# Patient Record
Sex: Female | Born: 1935 | Race: White | Hispanic: No | Marital: Single | Smoking: Never smoker
Health system: Southern US, Community
[De-identification: ages and names within clinical notes are randomized; demographics above are authoritative.]

## PROBLEM LIST (undated history)

## (undated) DIAGNOSIS — C50919 Malignant neoplasm of unspecified site of unspecified female breast: Secondary | ICD-10-CM

## (undated) DIAGNOSIS — I340 Nonrheumatic mitral (valve) insufficiency: Secondary | ICD-10-CM

## (undated) DIAGNOSIS — I4719 Other supraventricular tachycardia: Secondary | ICD-10-CM

## (undated) DIAGNOSIS — K5730 Diverticulosis of large intestine without perforation or abscess without bleeding: Secondary | ICD-10-CM

## (undated) DIAGNOSIS — I471 Supraventricular tachycardia: Secondary | ICD-10-CM

## (undated) DIAGNOSIS — K219 Gastro-esophageal reflux disease without esophagitis: Secondary | ICD-10-CM

## (undated) DIAGNOSIS — I050 Rheumatic mitral stenosis: Secondary | ICD-10-CM

## (undated) DIAGNOSIS — E039 Hypothyroidism, unspecified: Secondary | ICD-10-CM

## (undated) DIAGNOSIS — K52839 Microscopic colitis, unspecified: Secondary | ICD-10-CM

## (undated) DIAGNOSIS — K589 Irritable bowel syndrome without diarrhea: Secondary | ICD-10-CM

## (undated) DIAGNOSIS — Z9889 Other specified postprocedural states: Secondary | ICD-10-CM

## (undated) DIAGNOSIS — D369 Benign neoplasm, unspecified site: Secondary | ICD-10-CM

## (undated) DIAGNOSIS — I4891 Unspecified atrial fibrillation: Secondary | ICD-10-CM

## (undated) DIAGNOSIS — C434 Malignant melanoma of scalp and neck: Secondary | ICD-10-CM

## (undated) DIAGNOSIS — K449 Diaphragmatic hernia without obstruction or gangrene: Secondary | ICD-10-CM

## (undated) DIAGNOSIS — I099 Rheumatic heart disease, unspecified: Secondary | ICD-10-CM

## (undated) DIAGNOSIS — G4730 Sleep apnea, unspecified: Secondary | ICD-10-CM

## (undated) HISTORY — DX: Other supraventricular tachycardia: I47.19

## (undated) HISTORY — DX: Hypothyroidism, unspecified: E03.9

## (undated) HISTORY — PX: MASTECTOMY: SHX3

## (undated) HISTORY — DX: Unspecified atrial fibrillation: I48.91

## (undated) HISTORY — PX: ABDOMINAL HYSTERECTOMY: SHX81

## (undated) HISTORY — DX: Irritable bowel syndrome, unspecified: K58.9

## (undated) HISTORY — DX: Other specified postprocedural states: Z98.890

## (undated) HISTORY — DX: Diaphragmatic hernia without obstruction or gangrene: K44.9

## (undated) HISTORY — DX: Malignant neoplasm of unspecified site of unspecified female breast: C50.919

## (undated) HISTORY — PX: HEMORRHOID SURGERY: SHX153

## (undated) HISTORY — DX: Microscopic colitis, unspecified: K52.839

## (undated) HISTORY — DX: Benign neoplasm, unspecified site: D36.9

## (undated) HISTORY — DX: Rheumatic heart disease, unspecified: I09.9

## (undated) HISTORY — DX: Diverticulosis of large intestine without perforation or abscess without bleeding: K57.30

## (undated) HISTORY — DX: Supraventricular tachycardia: I47.1

## (undated) HISTORY — DX: Nonrheumatic mitral (valve) insufficiency: I34.0

## (undated) HISTORY — PX: TONSILLECTOMY: SUR1361

## (undated) HISTORY — DX: Gastro-esophageal reflux disease without esophagitis: K21.9

## (undated) HISTORY — PX: OTHER SURGICAL HISTORY: SHX169

## (undated) HISTORY — PX: CHOLECYSTECTOMY: SHX55

## (undated) HISTORY — DX: Rheumatic mitral stenosis: I05.0

## (undated) HISTORY — DX: Malignant melanoma of scalp and neck: C43.4

---

## 1998-02-08 ENCOUNTER — Encounter: Admission: RE | Admit: 1998-02-08 | Discharge: 1998-05-09 | Payer: Self-pay | Admitting: *Deleted

## 2000-07-13 ENCOUNTER — Encounter: Admission: RE | Admit: 2000-07-13 | Discharge: 2000-07-13 | Payer: Self-pay | Admitting: Oncology

## 2000-07-13 ENCOUNTER — Encounter (HOSPITAL_COMMUNITY): Admission: RE | Admit: 2000-07-13 | Discharge: 2000-08-12 | Payer: Self-pay | Admitting: Oncology

## 2000-12-24 ENCOUNTER — Encounter: Admission: RE | Admit: 2000-12-24 | Discharge: 2000-12-24 | Payer: Self-pay | Admitting: Oncology

## 2000-12-24 ENCOUNTER — Encounter (HOSPITAL_COMMUNITY): Admission: RE | Admit: 2000-12-24 | Discharge: 2001-01-23 | Payer: Self-pay | Admitting: Oncology

## 2000-12-24 ENCOUNTER — Encounter (HOSPITAL_COMMUNITY): Payer: Self-pay | Admitting: Oncology

## 2001-02-20 DIAGNOSIS — K52839 Microscopic colitis, unspecified: Secondary | ICD-10-CM

## 2001-02-20 HISTORY — DX: Microscopic colitis, unspecified: K52.839

## 2001-05-27 ENCOUNTER — Ambulatory Visit (HOSPITAL_COMMUNITY): Admission: RE | Admit: 2001-05-27 | Discharge: 2001-05-27 | Payer: Self-pay | Admitting: Internal Medicine

## 2001-06-26 ENCOUNTER — Encounter (HOSPITAL_COMMUNITY): Admission: RE | Admit: 2001-06-26 | Discharge: 2001-07-26 | Payer: Self-pay | Admitting: Family Medicine

## 2001-07-09 ENCOUNTER — Encounter (INDEPENDENT_AMBULATORY_CARE_PROVIDER_SITE_OTHER): Payer: Self-pay | Admitting: Internal Medicine

## 2001-07-09 ENCOUNTER — Ambulatory Visit (HOSPITAL_COMMUNITY): Admission: RE | Admit: 2001-07-09 | Discharge: 2001-07-09 | Payer: Self-pay | Admitting: Internal Medicine

## 2001-10-25 ENCOUNTER — Ambulatory Visit (HOSPITAL_COMMUNITY): Admission: RE | Admit: 2001-10-25 | Discharge: 2001-10-25 | Payer: Self-pay | Admitting: Internal Medicine

## 2001-10-25 ENCOUNTER — Encounter (INDEPENDENT_AMBULATORY_CARE_PROVIDER_SITE_OTHER): Payer: Self-pay | Admitting: Internal Medicine

## 2001-12-24 ENCOUNTER — Encounter (HOSPITAL_COMMUNITY): Payer: Self-pay | Admitting: Oncology

## 2001-12-27 ENCOUNTER — Encounter (HOSPITAL_COMMUNITY): Admission: RE | Admit: 2001-12-27 | Discharge: 2002-01-26 | Payer: Self-pay | Admitting: Oncology

## 2001-12-27 ENCOUNTER — Encounter: Admission: RE | Admit: 2001-12-27 | Discharge: 2001-12-27 | Payer: Self-pay | Admitting: Oncology

## 2002-06-27 ENCOUNTER — Encounter (HOSPITAL_COMMUNITY): Admission: RE | Admit: 2002-06-27 | Discharge: 2002-07-27 | Payer: Self-pay | Admitting: Oncology

## 2002-06-27 ENCOUNTER — Encounter: Admission: RE | Admit: 2002-06-27 | Discharge: 2002-06-27 | Payer: Self-pay | Admitting: Oncology

## 2002-07-04 ENCOUNTER — Encounter (HOSPITAL_COMMUNITY): Payer: Self-pay | Admitting: Oncology

## 2002-09-30 ENCOUNTER — Encounter: Payer: Self-pay | Admitting: Family Medicine

## 2002-09-30 ENCOUNTER — Ambulatory Visit (HOSPITAL_COMMUNITY): Admission: RE | Admit: 2002-09-30 | Discharge: 2002-09-30 | Payer: Self-pay | Admitting: Family Medicine

## 2002-12-26 ENCOUNTER — Encounter (HOSPITAL_COMMUNITY): Admission: RE | Admit: 2002-12-26 | Discharge: 2003-01-25 | Payer: Self-pay | Admitting: Oncology

## 2002-12-26 ENCOUNTER — Encounter: Admission: RE | Admit: 2002-12-26 | Discharge: 2002-12-26 | Payer: Self-pay | Admitting: Oncology

## 2003-06-26 ENCOUNTER — Encounter (HOSPITAL_COMMUNITY): Admission: RE | Admit: 2003-06-26 | Discharge: 2003-07-26 | Payer: Self-pay | Admitting: Oncology

## 2003-06-26 ENCOUNTER — Encounter: Admission: RE | Admit: 2003-06-26 | Discharge: 2003-06-26 | Payer: Self-pay | Admitting: Oncology

## 2003-08-03 ENCOUNTER — Inpatient Hospital Stay (HOSPITAL_COMMUNITY): Admission: RE | Admit: 2003-08-03 | Discharge: 2003-08-05 | Payer: Self-pay | Admitting: General Surgery

## 2004-01-04 ENCOUNTER — Encounter: Admission: RE | Admit: 2004-01-04 | Discharge: 2004-01-04 | Payer: Self-pay | Admitting: Oncology

## 2004-01-04 ENCOUNTER — Encounter (HOSPITAL_COMMUNITY): Admission: RE | Admit: 2004-01-04 | Discharge: 2004-02-03 | Payer: Self-pay | Admitting: Oncology

## 2004-02-10 ENCOUNTER — Ambulatory Visit (HOSPITAL_COMMUNITY): Admission: RE | Admit: 2004-02-10 | Discharge: 2004-02-10 | Payer: Self-pay | Admitting: Family Medicine

## 2004-04-07 ENCOUNTER — Ambulatory Visit: Payer: Self-pay | Admitting: Internal Medicine

## 2004-06-27 ENCOUNTER — Encounter: Admission: RE | Admit: 2004-06-27 | Discharge: 2004-06-27 | Payer: Self-pay | Admitting: Oncology

## 2004-06-27 ENCOUNTER — Encounter (HOSPITAL_COMMUNITY): Admission: RE | Admit: 2004-06-27 | Discharge: 2004-07-27 | Payer: Self-pay | Admitting: Oncology

## 2004-06-27 ENCOUNTER — Ambulatory Visit (HOSPITAL_COMMUNITY): Payer: Self-pay | Admitting: Oncology

## 2004-08-01 ENCOUNTER — Ambulatory Visit: Payer: Self-pay | Admitting: *Deleted

## 2004-08-03 ENCOUNTER — Ambulatory Visit (HOSPITAL_COMMUNITY): Admission: RE | Admit: 2004-08-03 | Discharge: 2004-08-03 | Payer: Self-pay | Admitting: *Deleted

## 2004-08-03 ENCOUNTER — Ambulatory Visit: Payer: Self-pay | Admitting: *Deleted

## 2004-08-08 ENCOUNTER — Ambulatory Visit: Payer: Self-pay | Admitting: *Deleted

## 2004-08-18 ENCOUNTER — Ambulatory Visit: Payer: Self-pay | Admitting: *Deleted

## 2004-08-24 ENCOUNTER — Encounter (HOSPITAL_COMMUNITY): Admission: RE | Admit: 2004-08-24 | Discharge: 2004-09-23 | Payer: Self-pay | Admitting: Oncology

## 2004-08-24 ENCOUNTER — Encounter: Admission: RE | Admit: 2004-08-24 | Discharge: 2004-08-24 | Payer: Self-pay | Admitting: Oncology

## 2004-08-24 ENCOUNTER — Ambulatory Visit (HOSPITAL_COMMUNITY): Payer: Self-pay | Admitting: Oncology

## 2004-09-01 ENCOUNTER — Ambulatory Visit: Payer: Self-pay | Admitting: *Deleted

## 2004-09-27 ENCOUNTER — Ambulatory Visit: Payer: Self-pay | Admitting: *Deleted

## 2004-10-27 ENCOUNTER — Ambulatory Visit: Payer: Self-pay | Admitting: *Deleted

## 2004-10-31 ENCOUNTER — Ambulatory Visit (HOSPITAL_COMMUNITY): Admission: RE | Admit: 2004-10-31 | Discharge: 2004-10-31 | Payer: Self-pay | Admitting: *Deleted

## 2004-10-31 ENCOUNTER — Ambulatory Visit: Payer: Self-pay | Admitting: *Deleted

## 2004-11-03 ENCOUNTER — Ambulatory Visit (HOSPITAL_COMMUNITY): Admission: RE | Admit: 2004-11-03 | Discharge: 2004-11-03 | Payer: Self-pay | Admitting: *Deleted

## 2004-11-03 ENCOUNTER — Ambulatory Visit: Payer: Self-pay | Admitting: *Deleted

## 2004-11-07 ENCOUNTER — Ambulatory Visit: Payer: Self-pay | Admitting: Cardiovascular Disease

## 2004-11-07 ENCOUNTER — Inpatient Hospital Stay (HOSPITAL_BASED_OUTPATIENT_CLINIC_OR_DEPARTMENT_OTHER): Admission: RE | Admit: 2004-11-07 | Discharge: 2004-11-07 | Payer: Self-pay | Admitting: Internal Medicine

## 2004-11-09 ENCOUNTER — Ambulatory Visit: Payer: Self-pay | Admitting: *Deleted

## 2004-11-15 ENCOUNTER — Ambulatory Visit: Payer: Self-pay | Admitting: *Deleted

## 2004-11-21 ENCOUNTER — Ambulatory Visit: Payer: Self-pay | Admitting: Internal Medicine

## 2004-12-01 ENCOUNTER — Ambulatory Visit: Payer: Self-pay | Admitting: Internal Medicine

## 2004-12-02 ENCOUNTER — Ambulatory Visit (HOSPITAL_COMMUNITY): Admission: RE | Admit: 2004-12-02 | Discharge: 2004-12-02 | Payer: Self-pay | Admitting: Family Medicine

## 2004-12-22 ENCOUNTER — Ambulatory Visit: Payer: Self-pay | Admitting: *Deleted

## 2005-01-24 ENCOUNTER — Ambulatory Visit: Payer: Self-pay | Admitting: *Deleted

## 2005-02-23 ENCOUNTER — Ambulatory Visit: Payer: Self-pay | Admitting: Cardiology

## 2005-04-03 ENCOUNTER — Ambulatory Visit: Payer: Self-pay | Admitting: *Deleted

## 2005-05-01 ENCOUNTER — Ambulatory Visit: Payer: Self-pay | Admitting: Cardiology

## 2005-05-25 ENCOUNTER — Encounter: Admission: RE | Admit: 2005-05-25 | Discharge: 2005-05-25 | Payer: Self-pay | Admitting: Oncology

## 2005-05-25 ENCOUNTER — Encounter (HOSPITAL_COMMUNITY): Admission: RE | Admit: 2005-05-25 | Discharge: 2005-06-24 | Payer: Self-pay | Admitting: Oncology

## 2005-06-06 ENCOUNTER — Ambulatory Visit: Payer: Self-pay | Admitting: *Deleted

## 2005-06-26 ENCOUNTER — Encounter: Admission: RE | Admit: 2005-06-26 | Discharge: 2005-06-26 | Payer: Self-pay | Admitting: Oncology

## 2005-06-26 ENCOUNTER — Encounter (HOSPITAL_COMMUNITY): Admission: RE | Admit: 2005-06-26 | Discharge: 2005-07-26 | Payer: Self-pay | Admitting: Oncology

## 2005-06-26 ENCOUNTER — Ambulatory Visit (HOSPITAL_COMMUNITY): Payer: Self-pay | Admitting: Oncology

## 2005-07-03 ENCOUNTER — Ambulatory Visit: Payer: Self-pay | Admitting: Cardiology

## 2005-08-03 ENCOUNTER — Ambulatory Visit: Payer: Self-pay | Admitting: *Deleted

## 2005-08-24 ENCOUNTER — Ambulatory Visit: Payer: Self-pay | Admitting: Internal Medicine

## 2005-08-31 ENCOUNTER — Ambulatory Visit: Payer: Self-pay | Admitting: Cardiology

## 2005-10-04 ENCOUNTER — Ambulatory Visit: Payer: Self-pay | Admitting: *Deleted

## 2005-10-13 ENCOUNTER — Ambulatory Visit: Payer: Self-pay | Admitting: Internal Medicine

## 2005-10-27 ENCOUNTER — Ambulatory Visit: Payer: Self-pay | Admitting: Cardiology

## 2005-11-23 ENCOUNTER — Ambulatory Visit: Payer: Self-pay | Admitting: Internal Medicine

## 2005-11-30 ENCOUNTER — Ambulatory Visit: Payer: Self-pay | Admitting: Cardiovascular Disease

## 2005-11-30 ENCOUNTER — Ambulatory Visit (HOSPITAL_COMMUNITY): Admission: RE | Admit: 2005-11-30 | Discharge: 2005-11-30 | Payer: Self-pay | Admitting: Internal Medicine

## 2005-12-26 ENCOUNTER — Ambulatory Visit: Payer: Self-pay | Admitting: Cardiology

## 2006-01-24 ENCOUNTER — Ambulatory Visit: Payer: Self-pay | Admitting: Cardiology

## 2006-02-14 ENCOUNTER — Ambulatory Visit: Payer: Self-pay | Admitting: Internal Medicine

## 2006-02-15 ENCOUNTER — Ambulatory Visit (HOSPITAL_COMMUNITY): Admission: RE | Admit: 2006-02-15 | Discharge: 2006-02-15 | Payer: Self-pay | Admitting: Internal Medicine

## 2006-02-22 ENCOUNTER — Ambulatory Visit: Payer: Self-pay | Admitting: Cardiology

## 2006-03-15 ENCOUNTER — Ambulatory Visit: Payer: Self-pay | Admitting: Cardiology

## 2006-04-06 ENCOUNTER — Ambulatory Visit: Payer: Self-pay | Admitting: Internal Medicine

## 2006-04-16 ENCOUNTER — Ambulatory Visit: Payer: Self-pay | Admitting: Internal Medicine

## 2006-04-26 ENCOUNTER — Ambulatory Visit: Payer: Self-pay | Admitting: Cardiology

## 2006-05-07 ENCOUNTER — Ambulatory Visit: Payer: Self-pay | Admitting: Cardiology

## 2006-05-18 ENCOUNTER — Ambulatory Visit: Payer: Self-pay | Admitting: Cardiology

## 2006-05-31 ENCOUNTER — Ambulatory Visit: Payer: Self-pay | Admitting: Cardiology

## 2006-06-26 ENCOUNTER — Encounter (HOSPITAL_COMMUNITY): Admission: RE | Admit: 2006-06-26 | Discharge: 2006-07-26 | Payer: Self-pay | Admitting: Oncology

## 2006-06-26 ENCOUNTER — Ambulatory Visit (HOSPITAL_COMMUNITY): Payer: Self-pay | Admitting: Oncology

## 2006-07-09 ENCOUNTER — Ambulatory Visit: Payer: Self-pay | Admitting: Cardiology

## 2006-07-25 ENCOUNTER — Ambulatory Visit: Payer: Self-pay | Admitting: Internal Medicine

## 2006-08-13 ENCOUNTER — Ambulatory Visit: Payer: Self-pay | Admitting: Cardiology

## 2006-09-12 ENCOUNTER — Ambulatory Visit: Payer: Self-pay | Admitting: Cardiovascular Disease

## 2006-10-10 ENCOUNTER — Ambulatory Visit: Payer: Self-pay | Admitting: Cardiology

## 2006-10-17 ENCOUNTER — Ambulatory Visit: Payer: Self-pay | Admitting: Cardiovascular Disease

## 2006-11-14 ENCOUNTER — Ambulatory Visit: Payer: Self-pay | Admitting: Cardiology

## 2006-12-11 ENCOUNTER — Ambulatory Visit: Payer: Self-pay | Admitting: Internal Medicine

## 2006-12-19 ENCOUNTER — Ambulatory Visit: Payer: Self-pay | Admitting: Cardiology

## 2007-01-21 ENCOUNTER — Ambulatory Visit: Payer: Self-pay | Admitting: Internal Medicine

## 2007-01-29 ENCOUNTER — Ambulatory Visit (HOSPITAL_COMMUNITY): Admission: RE | Admit: 2007-01-29 | Discharge: 2007-01-29 | Payer: Self-pay | Admitting: Internal Medicine

## 2007-01-30 ENCOUNTER — Ambulatory Visit: Payer: Self-pay | Admitting: Internal Medicine

## 2007-02-25 ENCOUNTER — Ambulatory Visit: Payer: Self-pay | Admitting: Cardiology

## 2007-03-25 ENCOUNTER — Ambulatory Visit: Payer: Self-pay | Admitting: Cardiology

## 2007-04-15 ENCOUNTER — Ambulatory Visit: Payer: Self-pay | Admitting: Cardiology

## 2007-05-02 ENCOUNTER — Ambulatory Visit: Payer: Self-pay | Admitting: Cardiology

## 2007-05-24 ENCOUNTER — Ambulatory Visit: Payer: Self-pay | Admitting: Internal Medicine

## 2007-05-27 ENCOUNTER — Ambulatory Visit: Payer: Self-pay | Admitting: Cardiology

## 2007-06-24 ENCOUNTER — Encounter (HOSPITAL_COMMUNITY): Admission: RE | Admit: 2007-06-24 | Discharge: 2007-07-24 | Payer: Self-pay | Admitting: Oncology

## 2007-06-24 ENCOUNTER — Ambulatory Visit (HOSPITAL_COMMUNITY): Payer: Self-pay | Admitting: Oncology

## 2007-07-01 ENCOUNTER — Ambulatory Visit: Payer: Self-pay | Admitting: Cardiology

## 2007-07-29 ENCOUNTER — Ambulatory Visit: Payer: Self-pay | Admitting: Cardiology

## 2007-08-12 ENCOUNTER — Ambulatory Visit: Payer: Self-pay | Admitting: Cardiology

## 2007-08-19 ENCOUNTER — Ambulatory Visit: Payer: Self-pay | Admitting: Internal Medicine

## 2007-09-09 ENCOUNTER — Ambulatory Visit: Payer: Self-pay | Admitting: Cardiology

## 2007-09-18 ENCOUNTER — Encounter: Payer: Self-pay | Admitting: Orthopedic Surgery

## 2007-09-18 ENCOUNTER — Ambulatory Visit (HOSPITAL_COMMUNITY): Admission: RE | Admit: 2007-09-18 | Discharge: 2007-09-18 | Payer: Self-pay | Admitting: Family Medicine

## 2007-09-30 ENCOUNTER — Ambulatory Visit (HOSPITAL_COMMUNITY): Admission: RE | Admit: 2007-09-30 | Discharge: 2007-09-30 | Payer: Self-pay | Admitting: Family Medicine

## 2007-09-30 ENCOUNTER — Encounter: Payer: Self-pay | Admitting: Orthopedic Surgery

## 2007-10-23 ENCOUNTER — Ambulatory Visit: Payer: Self-pay | Admitting: Orthopedic Surgery

## 2007-10-23 DIAGNOSIS — S92309A Fracture of unspecified metatarsal bone(s), unspecified foot, initial encounter for closed fracture: Secondary | ICD-10-CM | POA: Insufficient documentation

## 2007-11-20 ENCOUNTER — Ambulatory Visit: Payer: Self-pay | Admitting: Orthopedic Surgery

## 2007-11-28 ENCOUNTER — Ambulatory Visit (HOSPITAL_COMMUNITY): Admission: RE | Admit: 2007-11-28 | Discharge: 2007-11-28 | Payer: Self-pay | Admitting: Internal Medicine

## 2007-11-28 ENCOUNTER — Ambulatory Visit: Payer: Self-pay | Admitting: Cardiology

## 2007-11-28 ENCOUNTER — Encounter: Payer: Self-pay | Admitting: Internal Medicine

## 2007-12-04 ENCOUNTER — Ambulatory Visit: Payer: Self-pay | Admitting: Cardiology

## 2007-12-12 ENCOUNTER — Ambulatory Visit: Payer: Self-pay | Admitting: Cardiology

## 2007-12-31 ENCOUNTER — Ambulatory Visit (HOSPITAL_COMMUNITY): Admission: RE | Admit: 2007-12-31 | Discharge: 2007-12-31 | Payer: Self-pay | Admitting: Family Medicine

## 2008-01-22 ENCOUNTER — Ambulatory Visit (HOSPITAL_COMMUNITY): Admission: RE | Admit: 2008-01-22 | Discharge: 2008-01-22 | Payer: Self-pay | Admitting: Family Medicine

## 2008-04-02 ENCOUNTER — Ambulatory Visit (HOSPITAL_COMMUNITY): Admission: RE | Admit: 2008-04-02 | Discharge: 2008-04-02 | Payer: Self-pay | Admitting: Family Medicine

## 2008-05-21 ENCOUNTER — Ambulatory Visit: Payer: Self-pay | Admitting: Cardiology

## 2008-05-21 DIAGNOSIS — Z9889 Other specified postprocedural states: Secondary | ICD-10-CM

## 2008-05-21 HISTORY — DX: Other specified postprocedural states: Z98.890

## 2008-05-29 ENCOUNTER — Ambulatory Visit: Payer: Self-pay | Admitting: Internal Medicine

## 2008-05-29 DIAGNOSIS — R1310 Dysphagia, unspecified: Secondary | ICD-10-CM | POA: Insufficient documentation

## 2008-05-29 DIAGNOSIS — K921 Melena: Secondary | ICD-10-CM | POA: Insufficient documentation

## 2008-06-12 ENCOUNTER — Encounter: Payer: Self-pay | Admitting: Internal Medicine

## 2008-06-12 ENCOUNTER — Ambulatory Visit (HOSPITAL_COMMUNITY): Admission: RE | Admit: 2008-06-12 | Discharge: 2008-06-12 | Payer: Self-pay | Admitting: Internal Medicine

## 2008-06-12 ENCOUNTER — Ambulatory Visit: Payer: Self-pay | Admitting: Internal Medicine

## 2008-06-12 HISTORY — PX: COLONOSCOPY: SHX174

## 2008-06-12 HISTORY — PX: ESOPHAGOGASTRODUODENOSCOPY: SHX1529

## 2008-06-17 ENCOUNTER — Encounter: Payer: Self-pay | Admitting: Internal Medicine

## 2008-06-22 ENCOUNTER — Encounter: Payer: Self-pay | Admitting: Internal Medicine

## 2008-06-22 ENCOUNTER — Ambulatory Visit (HOSPITAL_COMMUNITY): Admission: RE | Admit: 2008-06-22 | Discharge: 2008-06-22 | Payer: Self-pay | Admitting: Internal Medicine

## 2008-06-22 DIAGNOSIS — K838 Other specified diseases of biliary tract: Secondary | ICD-10-CM

## 2008-06-24 ENCOUNTER — Encounter (INDEPENDENT_AMBULATORY_CARE_PROVIDER_SITE_OTHER): Payer: Self-pay | Admitting: *Deleted

## 2008-06-24 ENCOUNTER — Encounter: Payer: Self-pay | Admitting: Internal Medicine

## 2008-06-24 LAB — CONVERTED CEMR LAB
ALT: 27 units/L
AST: 29 units/L
Albumin: 4.3 g/dL
Alkaline Phosphatase: 71 units/L
Bilirubin, Direct: 0.3 mg/dL
Total Protein: 7.9 g/dL

## 2008-06-26 ENCOUNTER — Ambulatory Visit: Payer: Self-pay | Admitting: Cardiology

## 2008-07-01 ENCOUNTER — Ambulatory Visit (HOSPITAL_COMMUNITY): Payer: Self-pay | Admitting: Oncology

## 2008-07-01 ENCOUNTER — Encounter (HOSPITAL_COMMUNITY): Admission: RE | Admit: 2008-07-01 | Discharge: 2008-07-31 | Payer: Self-pay | Admitting: Oncology

## 2008-07-07 LAB — CONVERTED CEMR LAB
ALT: 27 units/L (ref 0–35)
AST: 29 units/L (ref 0–37)
Albumin: 4.3 g/dL (ref 3.5–5.2)
Alkaline Phosphatase: 71 units/L (ref 39–117)
Bilirubin, Direct: 0.3 mg/dL (ref 0.0–0.3)
Indirect Bilirubin: 0.8 mg/dL (ref 0.0–0.9)
Total Bilirubin: 1.1 mg/dL (ref 0.3–1.2)
Total Protein: 7.9 g/dL (ref 6.0–8.3)

## 2008-07-09 ENCOUNTER — Ambulatory Visit: Payer: Self-pay | Admitting: Cardiology

## 2008-08-13 ENCOUNTER — Ambulatory Visit: Payer: Self-pay | Admitting: Cardiology

## 2008-09-28 ENCOUNTER — Encounter (INDEPENDENT_AMBULATORY_CARE_PROVIDER_SITE_OTHER): Payer: Self-pay | Admitting: *Deleted

## 2008-10-05 ENCOUNTER — Encounter: Payer: Self-pay | Admitting: *Deleted

## 2008-10-20 ENCOUNTER — Ambulatory Visit: Payer: Self-pay

## 2008-10-20 ENCOUNTER — Ambulatory Visit: Payer: Self-pay | Admitting: Cardiology

## 2008-10-20 DIAGNOSIS — I059 Rheumatic mitral valve disease, unspecified: Secondary | ICD-10-CM

## 2008-10-20 DIAGNOSIS — R0602 Shortness of breath: Secondary | ICD-10-CM

## 2008-10-20 DIAGNOSIS — I4891 Unspecified atrial fibrillation: Secondary | ICD-10-CM

## 2008-10-20 LAB — CONVERTED CEMR LAB: POC INR: 3.2

## 2008-11-19 ENCOUNTER — Ambulatory Visit: Payer: Self-pay | Admitting: Cardiology

## 2008-11-19 ENCOUNTER — Ambulatory Visit (HOSPITAL_COMMUNITY): Admission: RE | Admit: 2008-11-19 | Discharge: 2008-11-19 | Payer: Self-pay | Admitting: Cardiology

## 2008-11-19 LAB — CONVERTED CEMR LAB: POC INR: 3.3

## 2008-11-20 ENCOUNTER — Ambulatory Visit: Payer: Self-pay | Admitting: Cardiology

## 2008-11-20 ENCOUNTER — Ambulatory Visit (HOSPITAL_COMMUNITY): Admission: RE | Admit: 2008-11-20 | Discharge: 2008-11-20 | Payer: Self-pay | Admitting: Cardiology

## 2008-11-20 ENCOUNTER — Encounter: Payer: Self-pay | Admitting: Cardiology

## 2008-11-23 ENCOUNTER — Encounter (INDEPENDENT_AMBULATORY_CARE_PROVIDER_SITE_OTHER): Payer: Self-pay | Admitting: *Deleted

## 2008-11-24 ENCOUNTER — Ambulatory Visit: Payer: Self-pay | Admitting: Cardiology

## 2008-11-30 ENCOUNTER — Encounter: Payer: Self-pay | Admitting: Cardiology

## 2008-12-03 ENCOUNTER — Encounter: Payer: Self-pay | Admitting: Cardiology

## 2008-12-17 ENCOUNTER — Ambulatory Visit: Payer: Self-pay | Admitting: Cardiology

## 2008-12-17 LAB — CONVERTED CEMR LAB: POC INR: 2.3

## 2009-01-26 ENCOUNTER — Encounter: Payer: Self-pay | Admitting: Internal Medicine

## 2009-03-10 ENCOUNTER — Ambulatory Visit: Payer: Self-pay | Admitting: Cardiology

## 2009-03-10 LAB — CONVERTED CEMR LAB: POC INR: 1.6

## 2009-03-31 ENCOUNTER — Ambulatory Visit: Payer: Self-pay | Admitting: Cardiology

## 2009-03-31 LAB — CONVERTED CEMR LAB: POC INR: 1.6

## 2009-04-12 ENCOUNTER — Encounter (INDEPENDENT_AMBULATORY_CARE_PROVIDER_SITE_OTHER): Payer: Self-pay | Admitting: *Deleted

## 2009-04-19 ENCOUNTER — Ambulatory Visit: Payer: Self-pay | Admitting: Cardiology

## 2009-04-19 LAB — CONVERTED CEMR LAB: POC INR: 2.4

## 2009-05-13 ENCOUNTER — Ambulatory Visit: Payer: Self-pay | Admitting: Cardiology

## 2009-05-13 LAB — CONVERTED CEMR LAB: POC INR: 2.4

## 2009-06-10 ENCOUNTER — Ambulatory Visit: Payer: Self-pay | Admitting: Cardiology

## 2009-06-10 ENCOUNTER — Encounter: Payer: Self-pay | Admitting: Gastroenterology

## 2009-07-01 ENCOUNTER — Encounter (INDEPENDENT_AMBULATORY_CARE_PROVIDER_SITE_OTHER): Payer: Self-pay | Admitting: *Deleted

## 2009-07-08 ENCOUNTER — Encounter (INDEPENDENT_AMBULATORY_CARE_PROVIDER_SITE_OTHER): Payer: Self-pay | Admitting: Pharmacist

## 2009-07-14 ENCOUNTER — Encounter: Payer: Self-pay | Admitting: Cardiology

## 2009-07-14 ENCOUNTER — Telehealth: Payer: Self-pay | Admitting: Cardiology

## 2009-07-14 LAB — CONVERTED CEMR LAB: POC INR: 2.2

## 2009-07-20 ENCOUNTER — Ambulatory Visit (HOSPITAL_COMMUNITY): Payer: Self-pay | Admitting: Oncology

## 2009-07-29 ENCOUNTER — Ambulatory Visit (HOSPITAL_COMMUNITY): Admission: RE | Admit: 2009-07-29 | Discharge: 2009-07-29 | Payer: Self-pay | Admitting: Oncology

## 2009-08-05 ENCOUNTER — Ambulatory Visit: Payer: Self-pay | Admitting: Cardiology

## 2009-08-05 LAB — CONVERTED CEMR LAB: POC INR: 2.7

## 2009-10-06 ENCOUNTER — Encounter (INDEPENDENT_AMBULATORY_CARE_PROVIDER_SITE_OTHER): Payer: Self-pay | Admitting: Pharmacist

## 2009-10-28 ENCOUNTER — Ambulatory Visit: Payer: Self-pay | Admitting: Cardiology

## 2009-11-10 ENCOUNTER — Encounter (INDEPENDENT_AMBULATORY_CARE_PROVIDER_SITE_OTHER): Payer: Self-pay

## 2009-11-10 ENCOUNTER — Ambulatory Visit: Payer: Self-pay | Admitting: Cardiology

## 2009-11-19 ENCOUNTER — Encounter: Payer: Self-pay | Admitting: Cardiology

## 2009-11-19 ENCOUNTER — Ambulatory Visit (HOSPITAL_COMMUNITY): Admission: RE | Admit: 2009-11-19 | Discharge: 2009-11-19 | Payer: Self-pay | Admitting: Cardiology

## 2009-11-19 ENCOUNTER — Ambulatory Visit: Payer: Self-pay | Admitting: Cardiology

## 2009-11-22 ENCOUNTER — Encounter: Payer: Self-pay | Admitting: Cardiology

## 2009-12-22 ENCOUNTER — Telehealth: Payer: Self-pay | Admitting: Cardiology

## 2010-02-12 ENCOUNTER — Inpatient Hospital Stay (HOSPITAL_COMMUNITY): Admission: EM | Admit: 2010-02-12 | Discharge: 2010-02-16 | Payer: Self-pay | Source: Home / Self Care

## 2010-02-28 ENCOUNTER — Encounter: Payer: Self-pay | Admitting: Cardiology

## 2010-02-28 ENCOUNTER — Telehealth: Payer: Self-pay | Admitting: Cardiology

## 2010-02-28 LAB — CONVERTED CEMR LAB
POC INR: 2.4
Prothrombin Time: 28.3 s

## 2010-03-03 ENCOUNTER — Ambulatory Visit
Admission: RE | Admit: 2010-03-03 | Discharge: 2010-03-03 | Payer: Self-pay | Source: Home / Self Care | Attending: Adult Health | Admitting: Adult Health

## 2010-03-03 ENCOUNTER — Encounter: Payer: Self-pay | Admitting: Adult Health

## 2010-03-10 ENCOUNTER — Telehealth: Payer: Self-pay | Admitting: Cardiology

## 2010-03-10 ENCOUNTER — Encounter: Payer: Self-pay | Admitting: Cardiology

## 2010-03-10 LAB — CONVERTED CEMR LAB: POC INR: 2.5

## 2010-03-14 ENCOUNTER — Ambulatory Visit: Admit: 2010-03-14 | Payer: Self-pay | Admitting: Cardiology

## 2010-03-22 NOTE — Medication Information (Signed)
Summary: ccr-lr  Anticoagulant Therapy  Managed by: Vashti Hey, RN PCP: Dr. Patrica Duel Supervising MD: Dietrich Pates MD, Molly Maduro Indication 1: Atrial Fibrillation (ICD-427.31) Lab Used: [facility] [place] Site: [place] INR POC 2.7  Dietary changes: no    Health status changes: no    Bleeding/hemorrhagic complications: no    Recent/future hospitalizations: no    Any changes in medication regimen? no    Recent/future dental: no  Any missed doses?: no       Is patient compliant with meds? yes       Allergies: 1)  ! Sulfa 2)  ! * "cillins"  Anticoagulation Management History:      The patient is taking warfarin and comes in today for a routine follow up visit.  Positive risk factors for bleeding include an age of 75 years or older and history of GI bleeding.  The bleeding index is 'intermediate risk'.  Positive CHADS2 values include Age > 24 years old.  The start date was 07/21/2004.  Anticoagulation responsible provider: Dietrich Pates MD, Molly Maduro.  INR POC: 2.7.  Cuvette Lot#: [id number].    Anticoagulation Management Assessment/Plan:      The patient's current anticoagulation dose is Coumadin 2.5 mg tabs: sun, tues, thurs, take 1 tablet, mon, wed, fri, sat take 1/2 tablet.  The target INR is 2 - 3.  The next INR is due 09/01/2009.  Anticoagulation instructions were given to patient.  Results were reviewed/authorized by Vashti Hey, RN.  She was notified by Vashti Hey RN.         Prior Anticoagulation Instructions: INR 2.2 at Dr Fusco's office Pt called to state she saw Dr Sherwood Gambler this morning and he checked her coumadin.  She said her INR was 2.2  He is also putting her on Erythromycin x 7 days for UTI.  Appt made for f/u INR check on 08/05/09.  No change in coumadin dose.  Current Anticoagulation Instructions: INR 2.7 Continue coumadin 1.25mg  once daily except 2.5mg  on Tuesdays, Thursdays and Saturdays

## 2010-03-22 NOTE — Medication Information (Signed)
Summary: ccr-lr  Anticoagulant Therapy  Managed by: Vashti Hey, RN PCP: Dr. Patrica Duel Supervising MD: Diona Browner MD, Remi Deter Indication 1: Atrial Fibrillation (ICD-427.31) Lab Used: [facility] [place] Site: [place] INR POC 3.5  Dietary changes: no    Health status changes: yes       Details: UTI  Bleeding/hemorrhagic complications: no    Recent/future hospitalizations: no    Any changes in medication regimen? yes       Details: Been on Abx 3 days  Has 5 days total  Recent/future dental: no  Any missed doses?: no       Is patient compliant with meds? yes       Allergies: 1)  ! Sulfa 2)  ! * "cillins"  Anticoagulation Management History:      The patient is taking warfarin and comes in today for a routine follow up visit.  Positive risk factors for bleeding include an age of 75 years or older and history of GI bleeding.  The bleeding index is 'intermediate risk'.  Positive CHADS2 values include Age > 62 years old.  The start date was 07/21/2004.  Anticoagulation responsible provider: Diona Browner MD, Remi Deter.  INR POC: 3.5.  Cuvette Lot#: [id number].    Anticoagulation Management Assessment/Plan:      The patient's current anticoagulation dose is Coumadin 2.5 mg tabs: sun, tues, thurs, take 1 tablet, mon, wed, fri, sat take 1/2 tablet.  The target INR is 2 - 3.  The next INR is due 12/02/2009.  Anticoagulation instructions were given to patient.  Results were reviewed/authorized by Vashti Hey, RN.  She was notified by Vashti Hey RN.         Prior Anticoagulation Instructions: INR 3.3 Hold coumadin tonight then resume 1.25mg  once daily except 2.5mg  on Tuesdays, Thursdays and Saturdays  Current Anticoagulation Instructions: INR 3.5 Hold coumadin tonight then resume 1.25mg  once daily except 2.5mg  on Tuesdays, Thursdays and Saturdays

## 2010-03-22 NOTE — Medication Information (Signed)
Summary: ccr-lr  Anticoagulant Therapy  Managed by: Cindy Hey, RN PCP: Dr. Patrica Duel Supervising MD: Andee Lineman MD, Michelle Piper Indication 1: Atrial Fibrillation (ICD-427.31) Lab Used: [facility] [place] Site: [place] INR POC 1.6  Dietary changes: no    Health status changes: no    Bleeding/hemorrhagic complications: no    Recent/future hospitalizations: no    Any changes in medication regimen? no    Recent/future dental: no  Any missed doses?: no       Is patient compliant with meds? yes       Allergies: 1)  ! Sulfa 2)  ! * "cillins"  Anticoagulation Management History:      The patient is taking warfarin and comes in today for a routine follow up visit.  Positive risk factors for bleeding include an age of 26 years or older and history of GI bleeding.  The bleeding index is 'intermediate risk'.  Positive CHADS2 values include Age > 53 years old.  The start date was 07/21/2004.  Anticoagulation responsible provider: Andee Lineman MD, Michelle Piper.  INR POC: 1.6.  Cuvette Lot#: [id number].    Anticoagulation Management Assessment/Plan:      The patient's current anticoagulation dose is Coumadin 2.5 mg tabs: sun, tues, thurs, take 1 tablet, mon, wed, fri, sat take 1/2 tablet.  The target INR is 2 - 3.  The next INR is due 04/19/2009.  Anticoagulation instructions were given to patient.  Results were reviewed/authorized by Cindy Hey, RN.  She was notified by Cindy Hey RN.         Prior Anticoagulation Instructions: INR 1.6 Take coumadin 2.5mg  x 2 days then resume 1.25mg  once daily except 2.5mg  on Tuesdays and Saturdays  Current Anticoagulation Instructions: INR 1.6 Take coumadin 2.5mg  x 2 days then increase dose to 1.25mg  once daily except 2.5mg  on T,Th,Sat

## 2010-03-22 NOTE — Medication Information (Signed)
Summary: ccr-lr  Anticoagulant Therapy  Managed by: Vashti Hey, RN PCP: Dr. Patrica Duel Supervising MD: Diona Browner MD, Remi Deter Indication 1: Atrial Fibrillation (ICD-427.31) Lab Used: [facility] [place] Site: [place] INR POC 2.4  Dietary changes: no    Health status changes: no    Bleeding/hemorrhagic complications: no    Recent/future hospitalizations: no    Any changes in medication regimen? no    Recent/future dental: no  Any missed doses?: no       Is patient compliant with meds? yes       Allergies: 1)  ! Sulfa 2)  ! * "cillins"  Anticoagulation Management History:      The patient is taking warfarin and comes in today for a routine follow up visit.  Positive risk factors for bleeding include an age of 75 years or older and history of GI bleeding.  The bleeding index is 'intermediate risk'.  Positive CHADS2 values include Age > 36 years old.  The start date was 07/21/2004.  Anticoagulation responsible provider: Diona Browner MD, Remi Deter.  INR POC: 2.4.  Cuvette Lot#: [id number].    Anticoagulation Management Assessment/Plan:      The patient's current anticoagulation dose is Coumadin 2.5 mg tabs: sun, tues, thurs, take 1 tablet, mon, wed, fri, sat take 1/2 tablet.  The target INR is 2 - 3.  The next INR is due 06/10/2009.  Anticoagulation instructions were given to patient.  Results were reviewed/authorized by Vashti Hey, RN.  She was notified by Vashti Hey RN.         Prior Anticoagulation Instructions: INR 2.4 Continue coumadin 1.25mg  once daily except 2.5mg  on Tuesdays, Thursdays and Saturdays  Current Anticoagulation Instructions: Same as Prior Instructions.

## 2010-03-22 NOTE — Letter (Signed)
Summary: Custom - Delinquent Coumadin 1  [facility]  [address] [place] [address]   Phone: [phone]  Fax: [phone]     Jul 08, 2009 MRN: [id number]   [name] [address] [place], [place]  [address]   Dear [name],  This letter is being sent to you as a reminder that it is necessary for you to get your INR/PT checked regularly so that we can optimize your care.  Our records indicate that you were scheduled to have a test done recently.  As of today, we have not received the results of this test.  It is very important that you have your INR checked.  Please call our office at the number listed above to schedule an appointment at your earliest convenience.    If you have recently had your protime checked or have discontinued this medication, please contact our office at the above phone number to clarify this issue.  Thank you for this prompt attention to this important health care matter.  Sincerely, Vashti Hey, RN  [facility] Team

## 2010-03-22 NOTE — Letter (Signed)
Summary: [place] [facility]  [address] [place] [address]   Phone: [phone]  Fax: [phone]      November 22, 2009 MRN: [id number]   [name] [address] [place], [place]  [address]   Dear [name],  Your test ordered by [name] has been reviewed by your physician (or physician assistant) and was found to be normal or stable. Your physician (or physician assistant) felt no changes were needed at this time.  __X__ Echocardiogram  ____ Cardiac Stress Test  ____ Lab Work  ____ Peripheral vascular study of arms, legs or neck  ____ CT scan or X-ray  ____ Lung or Breathing test  ____ Other:  Please continue on current medical treatment.  Thank you.   Nona Dell, MD, F.A.C.C

## 2010-03-22 NOTE — Miscellaneous (Signed)
Summary: LABS LIVER,06/24/2008  Clinical Lists Changes  Observations: Added new observation of ALBUMIN: 4.3 g/dL (91/47/8295 62:13) Added new observation of PROTEIN, TOT: 7.9 g/dL (08/65/7846 96:29) Added new observation of SGPT (ALT): 27 units/L (06/24/2008 11:48) Added new observation of SGOT (AST): 29 units/L (06/24/2008 11:48) Added new observation of ALK PHOS: 71 units/L (06/24/2008 11:48) Added new observation of BILI DIRECT: 0.3 mg/dL (52/84/1324 40:10)

## 2010-03-22 NOTE — Miscellaneous (Signed)
Summary: medications update  Clinical Lists Changes  Medications: Removed medication of ESTRADIOL VALERATE 20 MG/ML OIL (ESTRADIOL VALERATE) take 1 tab dialy Added new medication of MEGESTROL ACETATE 20 MG TABS (MEGESTROL ACETATE) Take 1 tablet by mouth once daily

## 2010-03-22 NOTE — Assessment & Plan Note (Signed)
Summary: 6 mth f/u per checkout on 10/20/08/tg   Visit Type:  Follow-up Primary Provider:  Dr. Patrica Duel   History of Present Illness: 77 year old woman presents for a followup visit. She reports fairly stable symptoms since her last visit. She has episodes of shortness of breath that are short lived, and potentially related to paroxysmal supraventricular arrhythmias. She has had no progression in baseline shortness of breath with activity. We reviewed her studies outlined below.  Followup Holter monitor did demonstrate paroxysmal supraventricular arrhythmias, some rapid, although not prolonged. Ventricular ectopy was also noted. Predominant rhythm was sinus, with some heart rates decreasing into the upper 40s. No obvious correlation with symptoms.  Echocardiography done in October 2010 revealed LVEF 55-60%, mild aortic regurgitation, mitral annular calcification with thickened leaflets consistent with rheumatic disease, only mildly restricted motion and only trivial mitral regurgitation. Left atrium was moderately dilated.  Current Medications (verified): 1)  Asacol 400 Mg Tbec (Mesalamine) .... 2 Two Times A Day 2)  Bentyl 10 Mg Caps (Dicyclomine Hcl) .... Two Times A Day 3)  Inderal La 80 Mg Xr24h-Cap (Propranolol Hcl) .... Once Daily 4)  Protonix 40 Mg Pack (Pantoprazole Sodium) .... Two Times A Day 5)  Synthroid 75 Mcg Tabs (Levothyroxine Sodium) .... Once Daily 6)  Klor-Con M20 20 Meq Cr-Tabs (Potassium Chloride Crys Cr) .... Once Daily 7)  Diltiazem Hcl Cr 120 Mg Xr24h-Cap (Diltiazem Hcl) .... Take 1 Tablet By Mouth Once A Day 8)  Coumadin 2.5 Mg Tabs (Warfarin Sodium) .... Evern Bio, Thurs, Take 1 Tablet, Mon, Wed, Fri, Sat Take 1/2 Tablet 9)  Hydrocodone-Acetaminophen 10-500 Mg Tabs (Hydrocodone-Acetaminophen) .... One By Mouth Q 4 Hrs Prn 10)  Fish Oil 1000 Mg Caps (Omega-3 Fatty Acids) .... Once Daily 11)  Avelox 400 Mg Tabs (Moxifloxacin Hcl) .... Take 1 Tab Daily  Allergies  (verified): 1)  ! Sulfa 2)  ! * "cillins"  Past History:  Past Medical History: Last updated: 10/20/2008 Rheumatic heart disease IBS GERD Breast cancer Mitral Insufficiency Mitral Stenosis Atrial Fibrillation G E R D Hypothyroidism Normal coronary arteries at catheterization 2006  Social History: Last updated: 10/20/2008 Retired  Tobacco Use - No.  Alcohol Use - no  Review of Systems  The patient denies anorexia, fever, chest pain, syncope, headaches, hemoptysis, melena, and hematochezia.         Otherwise reviewed and negative.  Vital Signs:  Patient profile:   77 year old female Weight:      136 pounds Pulse rate:   66 / minute BP sitting:   125 / 69  (right arm)  Vitals Entered By: Dreama Saa, CNA (April 19, 2009 8:51 AM)  Physical Exam  Additional Exam:  Thin elderly woman in no acute distress. HEENT: Conjunctiva and lids are normal, oropharynx was most moist mucosa. Neck: Supple, no elevated jugular venous pressure, no carotid bruits. No thyromegaly. Lungs: Diminished breath sounds, otherwise clear, nonlabored. Cardiac: Regular rate and rhythm, 2/6 apical systolic murmur, no S3 gallop, no pericardial rub.  Extremities: No significant pitting edema, distal pulses one plus. Neuropsychiatric: Alert oriented x3, affect appropriate.   Impression & Recommendations:  Problem # 1:  FIBRILLATION, ATRIAL (ICD-427.31)  Symptomatically stable on present medical regimen including rate control and Coumadin. Holter monitor following last visit did demonstrate breakthrough supraventricular arrhythmias. We discussed the potential for antiarrhythmic therapy if her symptoms progress. For now we will continue with observation, and followup in 6 months.  Her updated medication list for this problem includes:  Inderal La 80 Mg Xr24h-cap (Propranolol hcl) ..... Once daily    Coumadin 2.5 Mg Tabs (Warfarin sodium) ..... Wynelle Link, tues, thurs, take 1 tablet, mon, wed, fri,  sat take 1/2 tablet  Problem # 2:  MITRAL VALVE DISORDER (ICD-424.0)  History of rheumatic heart disease, with findings of only mild mitral valve restriction and no more than mild mitral regurgitation a most recent echocardiogram.  Her updated medication list for this problem includes:    Inderal La 80 Mg Xr24h-cap (Propranolol hcl) ..... Once daily  Patient Instructions: 1)  Your physician recommends that you schedule a follow-up appointment in: 6 months 2)  Your physician recommends that you continue on your current medications as directed. Please refer to the Current Medication list given to you today.  Prevention & Chronic Care Immunizations   Influenza vaccine: Not documented    Tetanus booster: Not documented    Pneumococcal vaccine: Not documented    H. zoster vaccine: Not documented  Colorectal Screening   Hemoccult: Not documented    Colonoscopy: Not documented  Other Screening   Pap smear: Not documented    Mammogram: Not documented    DXA bone density scan: Not documented   Smoking status: never  (10/20/2008)  Lipids   Total Cholesterol: Not documented   LDL: Not documented   LDL Direct: Not documented   HDL: Not documented   Triglycerides: Not documented

## 2010-03-22 NOTE — Progress Notes (Signed)
Summary: coumadin management  Phone Note Call from Patient   Caller: Patient Call For: Vashti Hey RN Reason for Call: Talk to Nurse Summary of Call: Pt called to state she saw Dr Sherwood Gambler this morning and he checked her coumadin.  She said her INR was 2.2  He is also putting her on Erythromycin x 7 days for UTI.  Appt made for f/u INR check on 08/05/09. Initial call taken by: Vashti Hey RN,  Jul 14, 2009 12:21 PM     Anticoagulant Therapy  Managed by: Vashti Hey, RN PCP: Dr. Patrica Duel Supervising MD: Diona Browner MD, Remi Deter Indication 1: Atrial Fibrillation (ICD-427.31) Lab Used: [facility]  Site: [place] INR POC 2.2  Dietary changes: no    Health status changes: yes       Details: kidney infection  Bleeding/hemorrhagic complications: no    Recent/future hospitalizations: no    Any changes in medication regimen? yes       Details: started on erythromycin x 7 days by Dr Sherwood Gambler  Recent/future dental: no  Any missed doses?: no       Is patient compliant with meds? yes         Anticoagulation Management History:      Positive risk factors for bleeding include an age of 27 years or older and history of GI bleeding.  The bleeding index is 'intermediate risk'.  Positive CHADS2 values include Age > 11 years old.  The start date was 07/21/2004.  Anticoagulation responsible provider: Diona Browner MD, Remi Deter.  INR POC: 2.2.    Anticoagulation Management Assessment/Plan:      The patient's current anticoagulation dose is Coumadin 2.5 mg tabs: sun, tues, thurs, take 1 tablet, mon, wed, fri, sat take 1/2 tablet.  The target INR is 2 - 3.  The next INR is due 08/05/2009.  Anticoagulation instructions were given to patient.  Results were reviewed/authorized by Vashti Hey, RN.  She was notified by Vashti Hey RN.         Prior Anticoagulation Instructions: INR 2.1 Has 8 days doxycycline left Continue coumadin 1.25mg  once daily except 2.5mg  on Tuesdays,  Thursdays and Saturdays  Current Anticoagulation Instructions: INR 2.2 at Dr Fusco's office Pt called to state she saw Dr Sherwood Gambler this morning and he checked her coumadin.  She said her INR was 2.2  He is also putting her on Erythromycin x 7 days for UTI.  Appt made for f/u INR check on 08/05/09.  No change in coumadin dose.

## 2010-03-22 NOTE — Medication Information (Signed)
Summary: ccr-lr  Anticoagulant Therapy  Managed by: Vashti Hey, RN PCP: Dr. Patrica Duel Supervising MD: Diona Browner MD, Remi Deter Indication 1: Atrial Fibrillation (ICD-427.31) Lab Used: [facility] [place] Site: [place] INR POC 2.1  Dietary changes: no    Health status changes: yes       Details: has bladder infection  Bleeding/hemorrhagic complications: no    Recent/future hospitalizations: no    Any changes in medication regimen? yes       Details: had Abx shot in office and was started on Doxycycline 100mg  bid on 05/28/09 x 10 days  Recent/future dental: no  Any missed doses?: no       Is patient compliant with meds? yes       Allergies: 1)  ! Sulfa 2)  ! * "cillins"  Anticoagulation Management History:      The patient is taking warfarin and comes in today for a routine follow up visit.  Positive risk factors for bleeding include an age of 71 years or older and history of GI bleeding.  The bleeding index is 'intermediate risk'.  Positive CHADS2 values include Age > 52 years old.  The start date was 07/21/2004.  Anticoagulation responsible provider: Diona Browner MD, Remi Deter.  INR POC: 2.1.  Cuvette Lot#: [id number].    Anticoagulation Management Assessment/Plan:      The patient's current anticoagulation dose is Coumadin 2.5 mg tabs: sun, tues, thurs, take 1 tablet, mon, wed, fri, sat take 1/2 tablet.  The target INR is 2 - 3.  The next INR is due 06/21/2009.  Anticoagulation instructions were given to patient.  Results were reviewed/authorized by Vashti Hey, RN.  She was notified by Vashti Hey RN.         Prior Anticoagulation Instructions: INR 2.4 Continue coumadin 1.25mg  once daily except 2.5mg  on Tuesdays, Thursdays and Saturdays  Current Anticoagulation Instructions: INR 2.1 Has 8 days doxycycline left Continue coumadin 1.25mg  once daily except 2.5mg  on Tuesdays, Thursdays and Saturdays Prescriptions: DILTIAZEM HCL CR 120 MG XR24H-CAP (DILTIAZEM  HCL) Take 1 tablet by mouth once a day  #30 x 6   Entered by:   Vashti Hey RN   Authorized by:   Kathlen Brunswick, MD, [facility]   Signed by:   Vashti Hey RN on 06/10/2009   Method used:   Electronically to        [facility]* (retail)       [address]       [place], [place]  [address]       Ph: [phone]       Fax: [phone]   RxID:   [id number]

## 2010-03-22 NOTE — Letter (Signed)
Summary: Recall Office Visit  [facility]  [address]   [place], [place] [address]   Phone: [phone]  Fax: [phone]      Jul 01, 2009   [name] [address] [place], [place]  [address] [birth year]   Dear Ms. [name],   According to our records, it is time for you to schedule a follow-up office visit with [place].   At your convenience, please call [phone] to schedule an office visit. If you have any questions, concerns, or feel that this letter is in error, we would appreciate your call.   Sincerely,    Diana Eves  [facility] Ph: [phone]   Fax: [phone]

## 2010-03-22 NOTE — Medication Information (Signed)
Summary: ccr-lr  Anticoagulant Therapy  Managed by: Vashti Hey, RN PCP: Dr. Patrica Duel Supervising MD: Dietrich Pates MD, Molly Maduro Indication 1: Atrial Fibrillation (ICD-427.31) Lab Used: [facility] [place] Site: [place] INR POC 3.3  Dietary changes: no    Health status changes: no    Bleeding/hemorrhagic complications: no    Recent/future hospitalizations: no    Any changes in medication regimen? no    Recent/future dental: no  Any missed doses?: no       Is patient compliant with meds? yes       Allergies: 1)  ! Sulfa 2)  ! * "cillins"  Anticoagulation Management History:      The patient is taking warfarin and comes in today for a routine follow up visit.  Positive risk factors for bleeding include an age of 75 years or older and history of GI bleeding.  The bleeding index is 'intermediate risk'.  Positive CHADS2 values include Age > 30 years old.  The start date was 07/21/2004.  Anticoagulation responsible provider: Dietrich Pates MD, Molly Maduro.  INR POC: 3.3.  Cuvette Lot#: [id number].    Anticoagulation Management Assessment/Plan:      The patient's current anticoagulation dose is Coumadin 2.5 mg tabs: sun, tues, thurs, take 1 tablet, mon, wed, fri, sat take 1/2 tablet.  The target INR is 2 - 3.  The next INR is due 11/25/2009.  Anticoagulation instructions were given to patient.  Results were reviewed/authorized by Vashti Hey, RN.  She was notified by Vashti Hey RN.         Prior Anticoagulation Instructions: INR 2.7 Continue coumadin 1.25mg  once daily except 2.5mg  on Tuesdays, Thursdays and Saturdays  Current Anticoagulation Instructions: INR 3.3 Hold coumadin tonight then resume 1.25mg  once daily except 2.5mg  on Tuesdays, Thursdays and Saturdays

## 2010-03-22 NOTE — Medication Information (Signed)
Summary: ccr-lr  Anticoagulant Therapy  Managed by: Vashti Hey, RN PCP: Dr. Patrica Duel Supervising MD: Dietrich Pates MD, Molly Maduro Indication 1: Atrial Fibrillation (ICD-427.31) Lab Used: [facility] [place] Site: [place] INR POC 2.4  Dietary changes: no    Health status changes: no    Bleeding/hemorrhagic complications: no    Recent/future hospitalizations: no    Any changes in medication regimen? yes       Details: has been on abx for throat infection  Recent/future dental: no  Any missed doses?: no       Is patient compliant with meds? yes       Current Medications (verified): 1)  Asacol 400 Mg Tbec (Mesalamine) .... 2 Two Times A Day 2)  Bentyl 10 Mg Caps (Dicyclomine Hcl) .... Two Times A Day 3)  Inderal La 80 Mg Xr24h-Cap (Propranolol Hcl) .... Once Daily 4)  Protonix 40 Mg Pack (Pantoprazole Sodium) .... Two Times A Day 5)  Synthroid 75 Mcg Tabs (Levothyroxine Sodium) .... Once Daily 6)  Klor-Con M20 20 Meq Cr-Tabs (Potassium Chloride Crys Cr) .... Once Daily 7)  Diltiazem Hcl Cr 120 Mg Xr24h-Cap (Diltiazem Hcl) .... Take 1 Tablet By Mouth Once A Day 8)  Coumadin 2.5 Mg Tabs (Warfarin Sodium) .... Evern Bio, Thurs, Take 1 Tablet, Mon, Wed, Fri, Sat Take 1/2 Tablet 9)  Hydrocodone-Acetaminophen 10-500 Mg Tabs (Hydrocodone-Acetaminophen) .... One By Mouth Q 4 Hrs Prn 10)  Fish Oil 1000 Mg Caps (Omega-3 Fatty Acids) .... Once Daily  Allergies (verified): 1)  ! Sulfa 2)  ! * "cillins"   Anticoagulation Management History:      The patient is taking warfarin and comes in today for a routine follow up visit.  Positive risk factors for bleeding include an age of 75 years or older and history of GI bleeding.  The bleeding index is 'intermediate risk'.  Positive CHADS2 values include Age > 75 years old.  The start date was 07/21/2004.  Anticoagulation responsible provider: Dietrich Pates MD, Molly Maduro.  INR POC: 2.4.  Cuvette Lot#: [id number].     Anticoagulation Management Assessment/Plan:      The patient's current anticoagulation dose is Coumadin 2.5 mg tabs: sun, tues, thurs, take 1 tablet, mon, wed, fri, sat take 1/2 tablet.  The target INR is 2 - 3.  The next INR is due 05/13/2009.  Anticoagulation instructions were given to patient.  Results were reviewed/authorized by Vashti Hey, RN.  She was notified by Vashti Hey RN.         Prior Anticoagulation Instructions: INR 1.6 Take coumadin 2.5mg  x 2 days then increase dose to 1.25mg  once daily except 2.5mg  on T,Th,Sat  Current Anticoagulation Instructions: INR 2.4 Continue coumadin 1.25mg  once daily except 2.5mg  on Tuesdays, Thursdays and Saturdays

## 2010-03-22 NOTE — Progress Notes (Signed)
Summary: Return Phone Call  Phone Note Call from Patient   Caller: Patient Reason for Call: Talk to Nurse Summary of Call: patient would like return phone call to explain to you why she hasn't been in for protime check / pt states that she will be home until 3pm / tg Initial call taken by: Raechel Ache [facility],  December 22, 2009 2:05 PM  Follow-up for Phone Call        Spoke with pt.  States she has not been for coumadin check this month because she has been seeing kidney doctor and Dr Sherwood Gambler.  They have sent her for all types of blood work.  She thinks cancer might be back but is waiting to hear.  Told her INR still needed to be checked  to assure range is theraputic.  Pt refused to make appt for INR at this time (against my advise).  States she will call back for appt. Follow-up by: Vashti Hey RN,  December 24, 2009 9:44 AM

## 2010-03-22 NOTE — Medication Information (Signed)
Summary: RX [facility] for pantoprazole  RX [facility] for pantoprazole   Imported By: Hendricks Limes LPN 16/11/9602 54:09:81  _____________________________________________________________________  External Attachment:    Type:   Image     Comment:   External Document

## 2010-03-22 NOTE — Assessment & Plan Note (Signed)
Summary: E4V   Visit Type:  Follow-up Primary Provider:  Dr. Elfredia Nevins   History of Present Illness: 77 year old woman presents for followup. I saw her back in February. She reports fatigue and dyspnea with exertion, NYHA class III at times. No major sense of palpitations or chest pain however. Reports compliance with her present medications.  Echocardiogram from last year is reviewed below in followup of rheumatic heart disease. She is due for a repeat study.   No bleeding problems on Coumadin. Continue to follow through the Coumadin clinic.  Current Medications (verified): 1)  Asacol 400 Mg Tbec (Mesalamine) .... 2 Two Times A Day 2)  Bentyl 10 Mg Caps (Dicyclomine Hcl) .... Two Times A Day 3)  Inderal La 80 Mg Xr24h-Cap (Propranolol Hcl) .... Once Daily 4)  Protonix 40 Mg Pack (Pantoprazole Sodium) .... Two Times A Day 5)  Synthroid 75 Mcg Tabs (Levothyroxine Sodium) .... Once Daily 6)  Klor-Con M20 20 Meq Cr-Tabs (Potassium Chloride Crys Cr) .... Once Daily 7)  Diltiazem Hcl Cr 120 Mg Xr24h-Cap (Diltiazem Hcl) .... Take 1 Tablet By Mouth Once A Day 8)  Coumadin 2.5 Mg Tabs (Warfarin Sodium) .... Evern Bio, Thurs, Take 1 Tablet, Mon, Wed, Fri, Sat Take 1/2 Tablet 9)  Hydrocodone-Acetaminophen 10-500 Mg Tabs (Hydrocodone-Acetaminophen) .... One By Mouth Q 4 Hrs Prn 10)  Fish Oil 1000 Mg Caps (Omega-3 Fatty Acids) .... Once Daily 11)  Estradiol Valerate 20 Mg/ml Oil (Estradiol Valerate) .... Take 1 Tab Dialy  Allergies: 1)  ! Sulfa 2)  ! * "cillins"  Past History:  Social History: Last updated: 10/20/2008 Retired  Tobacco Use - No.  Alcohol Use - no  Past Medical History: Rheumatic heart disease IBS GERD Breast cancer Mitral Insufficiency Mitral Stenosis Atrial Fibrillation PAT G E R D Hypothyroidism Normal coronary arteries at catheterization 2006   Review of Systems       The patient complains of dyspnea on exertion.  The patient denies anorexia, fever,  chest pain, syncope, peripheral edema, prolonged cough, melena, and hematochezia.         Otherwise reviewed and negative.  Vital Signs:  Patient profile:   77 year old female Weight:      133 pounds BMI:     23.65 Pulse rate:   100 / minute BP sitting:   126 / 70  (right arm)  Vitals Entered By: Dreama Saa, CNA (November 10, 2009 12:54 PM)  Physical Exam  Additional Exam:  Thin elderly woman in no acute distress. HEENT: Conjunctiva and lids are normal, oropharynx was most moist mucosa. Neck: Supple, no elevated jugular venous pressure, no carotid bruits. No thyromegaly. Lungs: Diminished breath sounds, otherwise clear, nonlabored. Cardiac: irregularly irregular, 2/6 apical systolic murmur, no S3 gallop, no pericardial rub.  Abdomen: Soft, nontender, bowel sounds present. Extremities: No significant pitting edema, distal pulses one plus. Neuropsychiatric: Alert oriented x3, affect appropriate. Skin: Warm and dry. Musculoskeletal: Kyphosis noted.   Echocardiogram  Procedure date:  11/20/2008  Findings:      Study Conclusions    1. Left ventricle: The cavity size was normal. There was mild      concentric hypertrophy. Systolic function was normal. The      estimated ejection fraction was in the range of 55% to 60%. Wall      motion was normal; there were no regional wall motion      abnormalities.   2. Aortic valve: Mild regurgitation.   3. Mitral valve: Mildly calcified annulus. Mildly  thickened leaflets      . Reduced mobility of the posterior leaflet, consistent with      rheumatic disease. No stenosis. Mobility of the anterior and      posterior leaflet was mildly restricted.   4. Left atrium: The atrium was moderately dilated.   5. Right atrium: The atrium was mildly dilated.  EKG  Procedure date:  11/10/2009  Findings:      Atrial fibrillation at 95 beats per minute.  Impression & Recommendations:  Problem # 1:  FIBRILLATION, ATRIAL  (ICD-427.31)  Paroxysmal, elevated ventricular response noted at this point to mild degree. May be also contributing to exertional symptoms. Plan to increase diltiazem CD to 180 mg daily. Continue Coumadin.  Her updated medication list for this problem includes:    Inderal La 80 Mg Xr24h-cap (Propranolol hcl) ..... Once daily    Coumadin 2.5 Mg Tabs (Warfarin sodium) ..... Wynelle Link, tues, thurs, take 1 tablet, mon, wed, fri, sat take 1/2 tablet  Problem # 2:  MITRAL VALVE DISORDER (ICD-424.0)  History of rheumatic heart disease with evidence of mitral stenosis and regurgitation, documented previously. Plan reassessment via echocardiogram. Last LVEF in the range of 55-60%.  Her updated medication list for this problem includes:    Inderal La 80 Mg Xr24h-cap (Propranolol hcl) ..... Once daily  Orders: 2-D Echocardiogram (2D Echo)  Patient Instructions: 1)  Your physician recommends that you schedule a follow-up appointment in: 6 months 2)  Your physician has recommended you make the following change in your medication: Increase Diltiazem to 180mg  by mouth once daily  3)  Your physician has requested that you have an echocardiogram.  Echocardiography is a painless test that uses sound waves to create images of your heart. It provides your doctor with information about the size and shape of your heart and how well your heart's chambers and valves are working.  This procedure takes approximately one hour. There are no restrictions for this procedure. Prescriptions: DILTIAZEM HCL ER BEADS 180 MG XR24H-CAP (DILTIAZEM HCL ER BEADS) take 1 tablet by mouth once daily  #30 x 5   Entered by:   Larita Fife Via LPN   Authorized by:   Loreli Slot, MD, [facility]   Signed by:   Larita Fife Via LPN on 16/11/9602   Method used:   Electronically to        [facility]* (retail)       [address]       [place], [place]  [address]       Ph: [phone]       Fax: [phone]    RxID:   [id number]

## 2010-03-22 NOTE — Letter (Signed)
Summary: Custom - Delinquent Coumadin 1  [facility]  [address] [place] [address]   Phone: [phone]  Fax: [phone]     October 06, 2009 MRN: [id number]   NAHLA LUKIN [address] [place], [place]  [address]   Dear Ms. VANDEVENDER,  This letter is being sent to you as a reminder that it is necessary for you to get your INR/PT checked regularly so that we can optimize your care.  Our records indicate that you were scheduled to have a test done recently.  As of today, we have not received the results of this test.  It is very important that you have your INR checked.  Please call our office at the number listed above to schedule an appointment at your earliest convenience.    If you have recently had your protime checked or have discontinued this medication, please contact our office at the above phone number to clarify this issue.  Thank you for this prompt attention to this important health care matter.  Sincerely, Vashti Hey RN  [facility] Team

## 2010-03-22 NOTE — Medication Information (Signed)
Summary: ccr-lr  Anticoagulant Therapy  Managed by: Vashti Hey, RN PCP: Dr. Patrica Duel Supervising MD: Diona Browner MD, Remi Deter Indication 1: Atrial Fibrillation (ICD-427.31) Lab Used: [facility] [place] Site: [place] INR POC 1.6  Dietary changes: no    Health status changes: no    Bleeding/hemorrhagic complications: no    Recent/future hospitalizations: no    Any changes in medication regimen? no    Recent/future dental: no  Any missed doses?: no       Is patient compliant with meds? yes      Comments: Pt has not been seen since 10/10.  Stressed the importance of regular INR checks and dangers of not having it checked regularly.  Pt has multiple excuses.  F/U made for 3 weeks.  Allergies: 1)  ! Sulfa 2)  ! * "cillins"  Anticoagulation Management History:      The patient is taking warfarin and comes in today for a routine follow up visit.  Positive risk factors for bleeding include an age of 6 years or older and history of GI bleeding.  The bleeding index is 'intermediate risk'.  Positive CHADS2 values include Age > 47 years old.  The start date was 07/21/2004.  Anticoagulation responsible provider: Diona Browner MD, Remi Deter.  INR POC: 1.6.  Cuvette Lot#: [id number].    Anticoagulation Management Assessment/Plan:      The patient's current anticoagulation dose is Coumadin 2.5 mg tabs: sun, tues, thurs, take 1 tablet, mon, wed, fri, sat take 1/2 tablet.  The target INR is 2 - 3.  The next INR is due 03/31/2009.  Anticoagulation instructions were given to patient.  Results were reviewed/authorized by Vashti Hey, RN.  She was notified by Vashti Hey RN.         Prior Anticoagulation Instructions: INR 2.3 today Continue coumadin 1.25mg  once daily except 2.5mg  on Tuesdays and Saturdays  Current Anticoagulation Instructions: INR 1.6 Take coumadin 2.5mg  x 2 days then resume 1.25mg  once daily except 2.5mg  on Tuesdays and Saturdays

## 2010-03-23 ENCOUNTER — Encounter: Payer: Self-pay | Admitting: Cardiology

## 2010-03-23 ENCOUNTER — Telehealth: Payer: Self-pay | Admitting: Cardiology

## 2010-03-24 NOTE — Progress Notes (Signed)
Summary: coumadin management  Phone Note Other Incoming   Caller: Dennison Nancy LPN [facility] Reason for Call: Discuss lab or test results Summary of Call: Called with results of PT/INR obtained on pt today.  PT 28.3   INR 2.4    Pt has been in hospital with atrial fib and UTI.  Per [facility] INR was checked on 02/21/10 and called to [facility].  INR was 1.6 and coumadin was increase to 2.5mg  once daily except 1.25mg  on W,F,Sat.  Order given for pt to continue same dose and recheck INR on 03/10/10. Initial call taken by: Vashti Hey RN,  February 28, 2010 8:54 AM     Anticoagulant Therapy  Managed by: Vashti Hey, RN PCP: Dr. Elfredia Nevins Supervising MD: Dietrich Pates MD, Molly Maduro Indication 1: Atrial Fibrillation (ICD-427.31) Lab Used: [facility] [place] Site: [place] PT 28.3 INR POC 2.4  Dietary changes: no    Health status changes: no    Bleeding/hemorrhagic complications: no    Recent/future hospitalizations: yes       Details: IN [facility] 12/24 - 12/28 for atrial fib and UTI  Any changes in medication regimen? no    Recent/future dental: no  Any missed doses?: no       Is patient compliant with meds? yes         Anticoagulation Management History:      Her anticoagulation is being managed by telephone today.  Positive risk factors for bleeding include an age of 57 years or older and history of GI bleeding.  The bleeding index is 'intermediate risk'.  Positive CHADS2 values include Age > 105 years old.  The start date was 07/21/2004.  Prothrombin time is 28.3.  Anticoagulation responsible provider: Dietrich Pates MD, Molly Maduro.  INR POC: 2.4.    Anticoagulation Management Assessment/Plan:      The patient's current anticoagulation dose is Coumadin 2.5 mg tabs: sun, tues, thurs, take 1 tablet, mon, wed, fri, sat take 1/2 tablet.  The target INR is 2 - 3.  The next INR is due 03/10/2010.  Anticoagulation instructions were given to patient.  Results were reviewed/authorized by Vashti Hey, RN.  She was notified by Vashti Hey RN.         Prior Anticoagulation Instructions: INR 3.5 Hold coumadin tonight then resume 1.25mg  once daily except 2.5mg  on Tuesdays, Thursdays and Saturdays  Current Anticoagulation Instructions: INR 2.4 Called with results of PT/INR obtained on pt today.  PT 28.3   INR 2.4    Pt has been in hospital with atrial fib and UTI.  Per [facility] INR was checked on 02/21/10 and called to [facility].  INR was 1.6 and coumadin was increase to 2.5mg  once daily except 1.25mg  on W,F,Sun.  Order given for pt to continue same dose and recheck INR on 03/10/10.

## 2010-03-24 NOTE — Progress Notes (Signed)
Summary: coumadin management  Phone Note Other Incoming   Caller: Lelon Perla RN [facility] Reason for Call: Discuss lab or test results Summary of Call: Called with results of PT/INR obtained on pt today.  PT 29.5  INR 2.5  Pt not sure if she has taken dose correctly but thinks she has.  Order given for pt to continue coumadin 2.5mg  once daily except 1.25mg  on M,W,F and recheck INR 03/23/10 Initial call taken by: Vashti Hey RN,  March 10, 2010 2:44 PM     Anticoagulant Therapy  Managed by: Vashti Hey, RN PCP: Dr. Elfredia Nevins Supervising MD: Dietrich Pates MD, Molly Maduro Indication 1: Atrial Fibrillation (ICD-427.31) Lab Used: [facility] [place] Site: [place] PT 29.5 INR POC 2.5  Dietary changes: no    Health status changes: no    Bleeding/hemorrhagic complications: no    Recent/future hospitalizations: no    Any changes in medication regimen? no    Recent/future dental: no  Any missed doses?: no       Is patient compliant with meds? yes         Anticoagulation Management History:      Her anticoagulation is being managed by telephone today.  Positive risk factors for bleeding include an age of 13 years or older and history of GI bleeding.  The bleeding index is 'intermediate risk'.  Positive CHADS2 values include Age > 1 years old.  The start date was 07/21/2004.  Prothrombin time is 29.5.  Anticoagulation responsible provider: Dietrich Pates MD, Molly Maduro.  INR POC: 2.5.    Anticoagulation Management Assessment/Plan:      The patient's current anticoagulation dose is Coumadin 2.5 mg tabs: sun, tues, thurs, take 1 tablet, mon, wed, fri, sat take 1/2 tablet.  The target INR is 2 - 3.  The next INR is due 03/23/2010.  Anticoagulation instructions were given to Lelon Perla RN [facility].  Results were reviewed/authorized by Vashti Hey, RN.  She was notified by Lelon Perla RN [facility].         Prior Anticoagulation Instructions: INR 2.4 Called with results of PT/INR obtained on pt  today.  PT 28.3   INR 2.4    Pt has been in hospital with atrial fib and UTI.  Per [facility] INR was checked on 02/21/10 and called to [facility].  INR was 1.6 and coumadin was increase to 2.5mg  once daily except 1.25mg  on W,F,Sun.  Order given for pt to continue same dose and recheck INR on 03/10/10.  Current Anticoagulation Instructions: INR 2.5 Called with results of PT/INR obtained on pt today.  PT 29.5  INR 2.5  Pt not sure if she has taken dose correctly but thinks she has.  Order given for pt to continue coumadin 2.5mg  once daily except 1.25mg  on M,W,F and recheck INR 03/23/10

## 2010-03-24 NOTE — Assessment & Plan Note (Signed)
Summary: eph   Visit Type:  Follow-up Primary Provider:  Dr. Elfredia Nevins   History of Present Illness: [name] is a 77 y/o CF who we are seeing for ongoing assessment and treatment of atril fib/flutter and mitral valve stenosis r/t rheumatic disease.  She was recently seen in [facility] for AFib with RVR and chest pain.  She was treated and had increased dose of diltiazem to 240mg  daily.  Echo was completed during hospitalization which did not reveal any significant mitral valve stenosis.  She was discharged with home health nurse who manages medications as she was suspected of noncompliance and poor dietary habits-lack of nutrition.  She is followed in our [place] office for coumadin checks.  She complained to home health nurse this morning that she had some chest discomfort that lasted most of the night, with some elevation in her heart rate. She tells me she ate a banana before going to bed and became anxious with the discomfort. She is still a little sore epigastrically.  She is on PPI and I have instructed her to take them as directed.   Current Medications (verified): 1)  Bentyl 10 Mg Caps (Dicyclomine Hcl) .... Take 1 Tab Qid 2)  Inderal La 80 Mg Xr24h-Cap (Propranolol Hcl) .... Once Daily 3)  Protonix 40 Mg Pack (Pantoprazole Sodium) .... Two Times A Day 4)  Synthroid 75 Mcg Tabs (Levothyroxine Sodium) .... Once Daily 5)  Klor-Con 10 10 Meq Cr-Tabs (Potassium Chloride) .... Take 1 Tab Daily 6)  Diltiazem Hcl Cr 240 Mg Xr24h-Cap (Diltiazem Hcl) .... Take 1 Tab Daily 7)  Coumadin 2.5 Mg Tabs (Warfarin Sodium) .... Evern Bio, Thurs, Take 1 Tablet, Mon, Wed, Fri, Sat Take 1/2 Tablet 8)  Hydrocodone-Acetaminophen 10-500 Mg Tabs (Hydrocodone-Acetaminophen) .... One By Mouth Q 4 Hrs Prn 9)  Fish Oil 1000 Mg Caps (Omega-3 Fatty Acids) .... Once Daily 10)  Chlorthalidone 25 Mg Tabs (Chlorthalidone) .... Take 1 Tab Daily 11)  Voltaren 1 % Gel (Diclofenac Sodium) .... Apply Two Times A  Day 12)  Furosemide 20 Mg Tabs (Furosemide) .... As Needed For Edema 13)  Vitamin B-12 1000 Mcg Tabs (Cyanocobalamin) .... Take 1 Tab Daily 14)  Hydrocortisone 10 Mg Tabs (Hydrocortisone) .... Take 2 Tabs Am 1 Tab Pm 15)  Lorazepam 0.5 Mg Tabs (Lorazepam) .... Take As Needed  Allergies (verified): 1)  ! Sulfa 2)  ! * "cillins"  Comments:  Nurse/Medical Assistant: patient brought med list from hospital discharge [facility]/28/2011[facility] reviewed-no changes except otherwise noted  Review of Systems       All other systems have been reviewed and are negative unless stated above.   Vital Signs:  Patient profile:   77 year old female Weight:      133 pounds BMI:     23.65 O2 Sat:      93 % on Room air Pulse rate:   83 / minute BP sitting:   127 / 81  (left arm)  Vitals Entered By: Dreama Saa, CNA (March 03, 2010 10:29 AM)  O2 Flow:  Room air  Physical Exam  General:  normal appearance.   Head:  normocephalic and atraumatic Eyes:  PERRLA/EOM intact; conjunctiva and lids normal. Lungs:  Clear bilaterally to auscultation and percussion. Heart:  Irregular with 1/6 systolic murmur at apex.  Pulses are palpable, no edema. No carotid bruits. Abdomen:  Bowel sounds positive; abdomen soft and non-tender without masses, organomegaly, or hernias noted. No hepatosplenomegaly. Msk:  joint tenderness.  Pulses:  pulses normal in all 4 extremities Extremities:  No clubbing or cyanosis. Neurologic:  Alert and oriented x 3. Psych:  depressed affect and anxious.     EKG  Procedure date:  03/03/2010  Findings:      Atrial flutter with a ventricular response rate of:  96 bpm  Impression & Recommendations:  Problem # 1:  FIBRILLATION, ATRIAL (ICD-427.31) Rate is controlled when she is at rest or not anxious.  Will not increase diltiazem dose now. She is easily anxious and worried about every ache and pain and when this occurs her heart rate increases.  I am  encouraged that a home health nurse is on board to assist in medical compliance and check of nutritional complaince.  She is quite thin and this will need careful monitoring.  I have refilled medications and noted that she has recently been placed on steroids.  This can cause a little anxiety and agitation.  I have refilled  lorazepam and will need to be followed by primary physician. Her updated medication list for this problem includes:    Inderal La 80 Mg Xr24h-cap (Propranolol hcl) ..... Once daily    Coumadin 2.5 Mg Tabs (Warfarin sodium) ..... Wynelle Link, tues, thurs, take 1 tablet, mon, wed, fri, sat take 1/2 tablet  Problem # 2:  DYSPNEA (ICD-786.05) Assessment: Unchanged  Her updated medication list for this problem includes:    Inderal La 80 Mg Xr24h-cap (Propranolol hcl) ..... Once daily    Diltiazem Hcl Cr 240 Mg Xr24h-cap (Diltiazem hcl) .Marland Kitchen... Take 1 tab daily    Chlorthalidone 25 Mg Tabs (Chlorthalidone) .Marland Kitchen... Take 1 tab daily    Furosemide 20 Mg Tabs (Furosemide) .Marland Kitchen... As needed for edema  Patient Instructions: 1)  Your physician recommends that you schedule a follow-up appointment in: 3 months 2)  Your physician recommends that you continue on your current medications as directed. Please refer to the Current Medication list given to you today. Prescriptions: LORAZEPAM 0.5 MG TABS (LORAZEPAM) take as needed  #90 x 1   Entered by:   Larita Fife Via LPN   Authorized by:   Joni Reining, NP   Signed by:   Larita Fife Via LPN on 16/11/9602   Method used:   Print then Give to Patient   RxID:   [id number] COUMADIN 2.5 MG TABS (WARFARIN SODIUM) sun, tues, thurs, take 1 tablet, mon, wed, fri, sat take 1/2 tablet  #60 x 2   Entered by:   Larita Fife Via LPN   Authorized by:   Joni Reining, NP   Signed by:   Larita Fife Via LPN on 21/30/8657   Method used:   Electronically to        [facility]* (retail)       [address]       [place], [place]  [address]        Ph: [phone]       Fax: [phone]   RxID:   [id number] DILTIAZEM HCL CR 240 MG XR24H-CAP (DILTIAZEM HCL) take 1 tab daily  #30 x 11   Entered by:   Larita Fife Via LPN   Authorized by:   Joni Reining, NP   Signed by:   Larita Fife Via LPN on 56/43/3295   Method used:   Electronically to        [facility] (retail)       [address]  [place], [place]  [address]       Ph: [phone]       Fax: [phone]   RxID:   [id number]

## 2010-03-30 NOTE — Progress Notes (Signed)
Summary: coumadin management  Phone Note Other Incoming   Caller: Lelon Perla RN [facility] Reason for Call: Discuss lab or test results Summary of Call: Called with results of PT/INR obtained on pt today.  PT 33.1  INR 2.8  Order given for pt to continue coumadin 2.5mg  once daily except 1.25mg  on M,W,F and recheck INR on 04/04/10. Initial call taken by: Vashti Hey RN,  March 23, 2010 1:10 PM     Anticoagulant Therapy  Managed by: Vashti Hey, RN PCP: Dr. Elfredia Nevins Supervising MD: Diona Browner MD, Remi Deter Indication 1: Atrial Fibrillation (ICD-427.31) Lab Used: [facility] [place] Site: [place] PT 33.1 INR POC 2.8  Dietary changes: no    Health status changes: no    Bleeding/hemorrhagic complications: no    Recent/future hospitalizations: no    Any changes in medication regimen? no    Recent/future dental: no  Any missed doses?: no       Is patient compliant with meds? yes         Anticoagulation Management History:      Her anticoagulation is being managed by telephone today.  Positive risk factors for bleeding include an age of 88 years or older and history of GI bleeding.  The bleeding index is 'intermediate risk'.  Positive CHADS2 values include Age > 48 years old.  The start date was 07/21/2004.  Prothrombin time is 33.1.  Anticoagulation responsible provider: Diona Browner MD, Remi Deter.  INR POC: 2.8.    Anticoagulation Management Assessment/Plan:      The patient's current anticoagulation dose is Coumadin 2.5 mg tabs: sun, tues, thurs, take 1 tablet, mon, wed, fri, sat take 1/2 tablet.  The target INR is 2 - 3.  The next INR is due 04/04/2010.  Anticoagulation instructions were given to Lelon Perla RN [facility].  Results were reviewed/authorized by Vashti Hey, RN.  She was notified by Lelon Perla RN [facility].         Prior Anticoagulation Instructions: INR 2.5 Called with results of PT/INR obtained on pt today.  PT 29.5  INR 2.5  Pt not sure if she has taken  dose correctly but thinks she has.  Order given for pt to continue coumadin 2.5mg  once daily except 1.25mg  on M,W,F and recheck INR 03/23/10  Current Anticoagulation Instructions: INR 2.8 Called with results of PT/INR obtained on pt today.  PT 33.1  INR 2.8  Order given for pt to continue coumadin 2.5mg  once daily except 1.25mg  on M,W,F and recheck INR on 04/04/10.

## 2010-04-04 ENCOUNTER — Encounter: Payer: Self-pay | Admitting: Cardiology

## 2010-04-04 ENCOUNTER — Telehealth: Payer: Self-pay | Admitting: Cardiology

## 2010-04-13 NOTE — Progress Notes (Signed)
Summary: coumadin management  Phone Note Other Incoming   Caller: Lelon Perla RN [facility] Reason for Call: Discuss lab or test results Summary of Call: Called with results of PT/INR obtained on pt today.  PT 38.3  INR 3.2   Pt has taken coumadin today.  Order given for pt to hold coumadin tomorrow night then resume 2.5mg  once daily except 1.25mg  on M,W,F and recheck INR on 04/18/10. Initial call taken by: Vashti Hey RN,  April 04, 2010 4:38 PM     Appended Document: Coumadin Clinic    Anticoagulant Therapy  Managed by: Vashti Hey, RN PCP: Dr. Elfredia Nevins Supervising MD: Dietrich Pates MD, Molly Maduro Indication 1: Atrial Fibrillation (ICD-427.31) Lab Used: [facility] [place] Site: [place] PT 38.3 INR POC 3.2  Dietary changes: no    Health status changes: no    Bleeding/hemorrhagic complications: no    Recent/future hospitalizations: no    Any changes in medication regimen? no    Recent/future dental: no  Any missed doses?: no       Is patient compliant with meds? yes       Allergies: 1)  ! Sulfa 2)  ! * "cillins"  Anticoagulation Management History:      Her anticoagulation is being managed by telephone today.  Positive risk factors for bleeding include an age of 43 years or older and history of GI bleeding.  The bleeding index is 'intermediate risk'.  Positive CHADS2 values include Age > 54 years old.  The start date was 07/21/2004.  Prothrombin time is 38.3.  Anticoagulation responsible provider: Dietrich Pates MD, Molly Maduro.  INR POC: 3.2.    Anticoagulation Management Assessment/Plan:      The patient's current anticoagulation dose is Coumadin 2.5 mg tabs: sun, tues, thurs, take 1 tablet, mon, wed, fri, sat take 1/2 tablet.  The target INR is 2 - 3.  The next INR is due 04/18/2010.  Anticoagulation instructions were given to Lelon Perla RN [facility].  Results were reviewed/authorized by Vashti Hey, RN.  She was notified by Lelon Perla RN [facility].         Prior  Anticoagulation Instructions: INR 2.8 Called with results of PT/INR obtained on pt today.  PT 33.1  INR 2.8  Order given for pt to continue coumadin 2.5mg  once daily except 1.25mg  on M,W,F and recheck INR on 04/04/10.  Current Anticoagulation Instructions: INR 3.2 Called with results of PT/INR obtained on pt today.  PT 38.3  INR 3.2   Pt has taken coumadin today.  Order given for pt to hold coumadin tomorrow night then resume 2.5mg  once daily except 1.25mg  on M,W,F and recheck INR on 04/18/10.

## 2010-04-18 ENCOUNTER — Encounter: Payer: Self-pay | Admitting: Cardiology

## 2010-04-18 ENCOUNTER — Encounter (INDEPENDENT_AMBULATORY_CARE_PROVIDER_SITE_OTHER): Payer: Medicare Other

## 2010-04-18 DIAGNOSIS — Z7901 Long term (current) use of anticoagulants: Secondary | ICD-10-CM

## 2010-04-18 DIAGNOSIS — I4891 Unspecified atrial fibrillation: Secondary | ICD-10-CM

## 2010-04-28 ENCOUNTER — Encounter: Payer: Self-pay | Admitting: *Deleted

## 2010-04-28 NOTE — Medication Information (Signed)
Summary: [facility]  Anticoagulant Therapy  Managed by: Vashti Hey, RN PCP: Dr. Elfredia Nevins Supervising MD: Diona Browner MD, Remi Deter Indication 1: Atrial Fibrillation (ICD-427.31) Lab Used: [facility] [place] Site: [place] INR POC 2.8  Dietary changes: no    Health status changes: no    Bleeding/hemorrhagic complications: no    Recent/future hospitalizations: no    Any changes in medication regimen? no    Recent/future dental: no  Any missed doses?: no       Is patient compliant with meds? yes       Allergies: 1)  ! Sulfa 2)  ! * "cillins"  Anticoagulation Management History:      The patient is taking warfarin and comes in today for a routine follow up visit.  Positive risk factors for bleeding include an age of 45 years or older and history of GI bleeding.  The bleeding index is 'intermediate risk'.  Positive CHADS2 values include Age > 77 years old.  The start date was 07/21/2004.  Anticoagulation responsible provider: Diona Browner MD, Remi Deter.  INR POC: 2.8.    Anticoagulation Management Assessment/Plan:      The patient's current anticoagulation dose is Coumadin 2.5 mg tabs: sun, tues, thurs, take 1 tablet, mon, wed, fri, sat take 1/2 tablet.  The target INR is 2 - 3.  The next INR is due 05/18/2010.  Anticoagulation instructions were given to patient.  Results were reviewed/authorized by Vashti Hey, RN.  She was notified by Vashti Hey RN.         Prior Anticoagulation Instructions: INR 3.2 Called with results of PT/INR obtained on pt today.  PT 38.3  INR 3.2   Pt has taken coumadin today.  Order given for pt to hold coumadin tomorrow night then resume 2.5mg  once daily except 1.25mg  on M,W,F and recheck INR on 04/18/10.  Current Anticoagulation Instructions: INR 2.8 Continue coumadin 2.5mg  once daily except 1.25mg  on Mondays, Wednesdays and Fridays

## 2010-04-29 ENCOUNTER — Ambulatory Visit (HOSPITAL_COMMUNITY)
Admission: RE | Admit: 2010-04-29 | Discharge: 2010-04-29 | Disposition: A | Discharge status: Home / Self Care | Payer: Medicare Other | Source: Ambulatory Visit | Attending: Internal Medicine | Admitting: Internal Medicine

## 2010-04-29 ENCOUNTER — Encounter (HOSPITAL_COMMUNITY): Payer: Self-pay

## 2010-04-29 ENCOUNTER — Other Ambulatory Visit (HOSPITAL_COMMUNITY): Payer: Self-pay | Admitting: Internal Medicine

## 2010-04-29 DIAGNOSIS — M949 Disorder of cartilage, unspecified: Secondary | ICD-10-CM | POA: Insufficient documentation

## 2010-04-29 DIAGNOSIS — M899 Disorder of bone, unspecified: Secondary | ICD-10-CM | POA: Insufficient documentation

## 2010-04-29 DIAGNOSIS — M549 Dorsalgia, unspecified: Secondary | ICD-10-CM

## 2010-04-29 DIAGNOSIS — M25559 Pain in unspecified hip: Secondary | ICD-10-CM | POA: Insufficient documentation

## 2010-04-29 DIAGNOSIS — M25551 Pain in right hip: Secondary | ICD-10-CM

## 2010-05-02 LAB — CBC
HCT: 44.4 % (ref 36.0–46.0)
Hemoglobin: 12 g/dL (ref 12.0–15.0)
Hemoglobin: 12.3 g/dL (ref 12.0–15.0)
Hemoglobin: 16 g/dL — ABNORMAL HIGH (ref 12.0–15.0)
MCH: 32.4 pg (ref 26.0–34.0)
MCH: 33.1 pg (ref 26.0–34.0)
MCHC: 35.4 g/dL (ref 30.0–36.0)
MCV: 92.1 fL (ref 78.0–100.0)
Platelets: 270 10*3/uL (ref 150–400)
RBC: 3.67 MIL/uL — ABNORMAL LOW (ref 3.87–5.11)
RBC: 3.8 MIL/uL — ABNORMAL LOW (ref 3.87–5.11)
WBC: 11.9 10*3/uL — ABNORMAL HIGH (ref 4.0–10.5)
WBC: 9.7 10*3/uL (ref 4.0–10.5)

## 2010-05-02 LAB — COMPREHENSIVE METABOLIC PANEL
ALT: 20 U/L (ref 0–35)
ALT: 30 U/L (ref 0–35)
ALT: 37 U/L — ABNORMAL HIGH (ref 0–35)
AST: 19 U/L (ref 0–37)
AST: 31 U/L (ref 0–37)
Alkaline Phosphatase: 43 U/L (ref 39–117)
Alkaline Phosphatase: 49 U/L (ref 39–117)
BUN: 59 mg/dL — ABNORMAL HIGH (ref 6–23)
CO2: 18 mEq/L — ABNORMAL LOW (ref 19–32)
CO2: 21 mEq/L (ref 19–32)
CO2: 23 mEq/L (ref 19–32)
Calcium: 8 mg/dL — ABNORMAL LOW (ref 8.4–10.5)
Calcium: 8.1 mg/dL — ABNORMAL LOW (ref 8.4–10.5)
Chloride: 105 mEq/L (ref 96–112)
Chloride: 107 mEq/L (ref 96–112)
GFR calc Af Amer: >60 mL/min (ref >60)
GFR calc Af Amer: >60 mL/min (ref >60)
GFR calc non Af Amer: 31 mL/min — ABNORMAL LOW (ref >60)
GFR calc non Af Amer: >60 mL/min (ref >60)
GFR calc non Af Amer: >60 mL/min (ref >60)
Glucose, Bld: 132 mg/dL — ABNORMAL HIGH (ref 70–99)
Glucose, Bld: 93 mg/dL (ref 70–99)
Potassium: 2.7 mEq/L — CL (ref 3.5–5.1)
Potassium: 4.5 mEq/L (ref 3.5–5.1)
Sodium: 135 mEq/L (ref 135–145)
Sodium: 136 mEq/L (ref 135–145)
Sodium: 138 mEq/L (ref 135–145)
Total Bilirubin: 0.8 mg/dL (ref 0.3–1.2)
Total Bilirubin: 1.4 mg/dL — ABNORMAL HIGH (ref 0.3–1.2)
Total Bilirubin: 1.5 mg/dL — ABNORMAL HIGH (ref 0.3–1.2)

## 2010-05-02 LAB — BASIC METABOLIC PANEL
BUN: 9 mg/dL (ref 6–23)
CO2: 23 mEq/L (ref 19–32)
Calcium: 8.4 mg/dL (ref 8.4–10.5)
GFR calc Af Amer: >60 mL/min (ref >60)
GFR calc non Af Amer: >60 mL/min (ref >60)
GFR calc non Af Amer: >60 mL/min (ref >60)
Glucose, Bld: 127 mg/dL — ABNORMAL HIGH (ref 70–99)
Potassium: 3.3 mEq/L — ABNORMAL LOW (ref 3.5–5.1)
Potassium: 3.7 mEq/L (ref 3.5–5.1)
Sodium: 136 mEq/L (ref 135–145)

## 2010-05-02 LAB — LIPID PANEL
Total CHOL/HDL Ratio: 4.9 RATIO
VLDL: 21 mg/dL (ref 0–40)

## 2010-05-02 LAB — URINE CULTURE
Colony Count: NO GROWTH
Culture  Setup Time: 201112252002
Culture: NO GROWTH

## 2010-05-02 LAB — HEPATIC FUNCTION PANEL
AST: 34 U/L (ref 0–37)
Albumin: 3.1 g/dL — ABNORMAL LOW (ref 3.5–5.2)

## 2010-05-02 LAB — PROTIME-INR
INR: 1.98 — ABNORMAL HIGH (ref 0.00–1.49)
INR: 2.46 — ABNORMAL HIGH (ref 0.00–1.49)
Prothrombin Time: 22.7 seconds — ABNORMAL HIGH (ref 11.6–15.2)
Prothrombin Time: 23.7 seconds — ABNORMAL HIGH (ref 11.6–15.2)
Prothrombin Time: 25.2 seconds — ABNORMAL HIGH (ref 11.6–15.2)
Prothrombin Time: 26.8 seconds — ABNORMAL HIGH (ref 11.6–15.2)

## 2010-05-02 LAB — TROPONIN I: Troponin I: 0.05 ng/mL (ref 0.00–0.06)

## 2010-05-02 LAB — URINALYSIS, ROUTINE W REFLEX MICROSCOPIC
Glucose, UA: NEGATIVE mg/dL
Ketones, ur: NEGATIVE mg/dL
Leukocytes, UA: NEGATIVE
Protein, ur: 100 mg/dL — AB
Urobilinogen, UA: 0.2 mg/dL (ref 0.0–1.0)

## 2010-05-02 LAB — DIFFERENTIAL
Basophils Absolute: 0 10*3/uL (ref 0.0–0.1)
Basophils Relative: 0 % (ref 0–1)
Basophils Relative: 0 % (ref 0–1)
Eosinophils Absolute: 0.1 10*3/uL (ref 0.0–0.7)
Eosinophils Absolute: 0.3 10*3/uL (ref 0.0–0.7)
Eosinophils Absolute: 0.4 10*3/uL (ref 0.0–0.7)
Eosinophils Relative: 3 % (ref 0–5)
Eosinophils Relative: 4 % (ref 0–5)
Lymphs Abs: 2.7 10*3/uL (ref 0.7–4.0)
Lymphs Abs: 2.9 10*3/uL (ref 0.7–4.0)
Lymphs Abs: 3.5 10*3/uL (ref 0.7–4.0)
Monocytes Relative: 5 % (ref 3–12)
Neutro Abs: 5.9 10*3/uL (ref 1.7–7.7)
Neutro Abs: 8.3 10*3/uL — ABNORMAL HIGH (ref 1.7–7.7)
Neutrophils Relative %: 60 % (ref 43–77)
Neutrophils Relative %: 70 % (ref 43–77)

## 2010-05-02 LAB — CK TOTAL AND CKMB (NOT AT ARMC)
CK, MB: 2 ng/mL (ref 0.3–4.0)
Relative Index: 0.4 (ref 0.0–2.5)
Total CK: 460 U/L — ABNORMAL HIGH (ref 7–177)

## 2010-05-02 LAB — POCT CARDIAC MARKERS: Myoglobin, poc: 361 ng/mL (ref 12–200)

## 2010-05-02 LAB — PHOSPHORUS: Phosphorus: 1.9 mg/dL — ABNORMAL LOW (ref 2.3–4.6)

## 2010-05-02 LAB — CARDIAC PANEL(CRET KIN+CKTOT+MB+TROPI)
CK, MB: 1.6 ng/mL (ref 0.3–4.0)
Total CK: 399 U/L — ABNORMAL HIGH (ref 7–177)
Total CK: 426 U/L — ABNORMAL HIGH (ref 7–177)
Total CK: 447 U/L — ABNORMAL HIGH (ref 7–177)
Troponin I: 0.04 ng/mL (ref 0.00–0.06)
Troponin I: 0.05 ng/mL (ref 0.00–0.06)

## 2010-05-02 LAB — TSH: TSH: 0.563 u[IU]/mL (ref 0.350–4.500)

## 2010-05-02 LAB — URINE MICROSCOPIC-ADD ON

## 2010-05-02 LAB — AMYLASE: Amylase: 69 U/L (ref 0–105)

## 2010-05-02 LAB — T4, FREE: Free T4: 1.62 ng/dL (ref 0.80–1.80)

## 2010-05-02 LAB — MAGNESIUM: Magnesium: 1.9 mg/dL (ref 1.5–2.5)

## 2010-05-18 ENCOUNTER — Ambulatory Visit (INDEPENDENT_AMBULATORY_CARE_PROVIDER_SITE_OTHER): Payer: Medicare Other | Admitting: *Deleted

## 2010-05-18 DIAGNOSIS — I4891 Unspecified atrial fibrillation: Secondary | ICD-10-CM

## 2010-05-18 DIAGNOSIS — Z7901 Long term (current) use of anticoagulants: Secondary | ICD-10-CM | POA: Insufficient documentation

## 2010-05-23 ENCOUNTER — Ambulatory Visit: Payer: Medicare Other | Admitting: Cardiology

## 2010-05-23 ENCOUNTER — Encounter: Payer: Self-pay | Admitting: Adult Health

## 2010-05-23 ENCOUNTER — Ambulatory Visit (INDEPENDENT_AMBULATORY_CARE_PROVIDER_SITE_OTHER): Payer: Medicare Other | Admitting: *Deleted

## 2010-05-23 ENCOUNTER — Ambulatory Visit (INDEPENDENT_AMBULATORY_CARE_PROVIDER_SITE_OTHER): Payer: Medicare Other | Admitting: Adult Health

## 2010-05-23 DIAGNOSIS — I4891 Unspecified atrial fibrillation: Secondary | ICD-10-CM

## 2010-05-23 DIAGNOSIS — Z7901 Long term (current) use of anticoagulants: Secondary | ICD-10-CM

## 2010-05-23 DIAGNOSIS — I4892 Unspecified atrial flutter: Secondary | ICD-10-CM

## 2010-05-23 DIAGNOSIS — I050 Rheumatic mitral stenosis: Secondary | ICD-10-CM

## 2010-05-23 DIAGNOSIS — R21 Rash and other nonspecific skin eruption: Secondary | ICD-10-CM | POA: Insufficient documentation

## 2010-05-23 DIAGNOSIS — I059 Rheumatic mitral valve disease, unspecified: Secondary | ICD-10-CM

## 2010-05-23 LAB — POCT INR: INR: 1.7

## 2010-05-23 NOTE — Progress Notes (Signed)
  Subjective:    Patient ID: [name], female    DOB: 02/28/1929, 77 y.o.   MRN: [id number]  HPI  [name] is a very anxious, frail lady that we are seeing for ongoing assessment of atrial/fib flutter on coumadin and followed in our coumadin clinic., rheumatic MV stenosis. She was last seem by me in January of 2012 and was doing fair. She continues to have multiple somatic complaints, but no chest pain or DOE.  She complains today of a pruritic rash that has been ongoing for a month, beginning in the back but now on the truck and both arms. She denies pain at the sites of the lesions. She has seen a dermatologist in the past but not in many years.  She continues medically compliant and comes to her scheduled coumadin appointments.    Review of SystemsReview of systems complete and found to be negative unless listed above     Objective:   Physical Exam General: Well developed, well nourished, with multiple somatic complaints Head: Eyes PERRLA, No xanthomas.   Normal cephalic and atramatic  Lungs: Clear bilaterally to auscultation and percussion. Heart: Irregular, systolic murmur at LSB, Pulses are 2+ & equal.             Soft carotid bruit. No JVD.  Abdomen: Bowel sounds are positive, abdomen soft and non-tender without masses or                  Hernia's noted. Msk:  Back normal, normal gait. Normal strength and tone for age. Skin: Multiple macular lesions with crusting and small amount of bleeding.  Not on the dermatone areas, not in clusters. Extremities: No clubbing, cyanosis or edema.  DP +1 Neuro: Alert and oriented X 3. Psych:  Good affect, responds appropriately       Assessment & Plan:

## 2010-05-23 NOTE — Assessment & Plan Note (Signed)
Stable.  No increased dyspnea or fluid retention noted.  Follow-up echo in one year unless she becomes symptomatic.

## 2010-05-23 NOTE — Assessment & Plan Note (Signed)
She is stable from cardiac standpoint.  Heart rate is mildly elevated, but she is very anxious and has multiple complaints.  She is to continue current medications.  She is not on any new medications at this time to contribute to rash.  She will be seen in 6 months, and see coumadin clinic as directed.

## 2010-05-23 NOTE — Patient Instructions (Addendum)
Your physician recommends that you schedule a follow-up appointment in: 6 months You have been referred to a dermatologist for skin rash

## 2010-05-31 LAB — CBC
HCT: 39.2 % (ref 36.0–46.0)
Hemoglobin: 13.6 g/dL (ref 12.0–15.0)
RBC: 3.99 MIL/uL (ref 3.87–5.11)
RDW: 12.9 % (ref 11.5–15.5)
WBC: 7.4 10*3/uL (ref 4.0–10.5)

## 2010-05-31 LAB — COMPREHENSIVE METABOLIC PANEL
Alkaline Phosphatase: 77 U/L (ref 39–117)
BUN: 32 mg/dL — ABNORMAL HIGH (ref 6–23)
CO2: 29 mEq/L (ref 19–32)
Chloride: 95 mEq/L — ABNORMAL LOW (ref 96–112)
GFR calc non Af Amer: >60 mL/min (ref >60)
Glucose, Bld: 95 mg/dL (ref 70–99)
Potassium: 4.5 mEq/L (ref 3.5–5.1)
Total Bilirubin: 0.6 mg/dL (ref 0.3–1.2)

## 2010-05-31 LAB — DIFFERENTIAL
Basophils Absolute: 0 10*3/uL (ref 0.0–0.1)
Basophils Relative: 1 % (ref 0–1)
Eosinophils Absolute: 0.3 10*3/uL (ref 0.0–0.7)
Neutro Abs: 4 10*3/uL (ref 1.7–7.7)
Neutrophils Relative %: 55 % (ref 43–77)

## 2010-06-01 ENCOUNTER — Encounter: Payer: Medicare Other | Admitting: *Deleted

## 2010-06-01 LAB — CREATININE, SERUM
Creatinine, Ser: 0.9 mg/dL (ref 0.4–1.2)
GFR calc non Af Amer: >60 mL/min (ref >60)

## 2010-06-13 ENCOUNTER — Encounter: Payer: Medicare Other | Admitting: *Deleted

## 2010-07-05 NOTE — Op Note (Signed)
[name], [name]                  ACCOUNT NO.:  [id number]   MEDICAL RECORD NO.:  [id number]          PATIENT TYPE:  AMB   LOCATION:  DAY                           FACILITY:  [facility]   PHYSICIAN:  R. Roetta Sessions, M.D. DATE OF BIRTH:  [birth year]   DATE OF PROCEDURE:  06/12/2008  DATE OF DISCHARGE:                               OPERATIVE REPORT   PROCEDURE:  EGD with Elease Hashimoto dilation followed by biopsy followed by  colonoscopy with snare polypectomy.   INDICATIONS FOR PROCEDURE:  A 77 year old lady, longstanding  gastroesophageal reflux disease, history of microscopic collagenous  colitis now with esophageal dysphagia to solids.  She has intermittent  bright red blood per rectum in a setting of constipation.  Colitis  symptoms well-controlled on Asacol.  EGD and colonoscopy now being done.  Potential for esophageal dilation reviewed.  Risks, benefits and  alternatives have been discussed.  Please see the documentation in the  medical record.   PROCEDURE NOTE:  O2 saturation, blood pressure, pulse, respiration were  monitored through the entire procedure.  Conscious sedation of Versed 5  mg IV, Demerol 50 mg IV in divided doses.  Instrument Pentax video chip  system.  Cetacaine spray for topical pharyngeal anesthesia.   EGD FINDINGS:  Examination of the tubular esophagus revealed normal  esophageal mucosa.  Esophageal lumen was patent through the EG junction.   STOMACH:  Gastric cavity had quite a bit of thick viscous bile stained  mucus otherwise it was empty and insufflated well with air.  However,  there was some persisting extrinsic mass effect on the body.  There was  a small hiatal hernia.  Thorough examination of the gastric mucosa was  carried out with quite a bit of suctioning and lavage of the bile  stained mucus/fluid.  The antrum was hyperemic and there were some  superficial erosions.  There was no infiltrating process or ulcer seen.  Pylorus was patent and easily  traversed.  Examination of the bulb and  second portion revealed no abnormalities.  Further examination of the  stomach demonstrated a 4 mm polyp in the fundus on retroflexion.  Therapeutic/diagnostic maneuvers performed:  Scope was withdrawn.  A 75  [place] Maloney dilator was passed to full insertion with ease.  A look  back revealed no apparent complication of the dilator.  Gastric polyp  was cold biopsied times one pass.  Subsequently biopsies of antral  mucosa were taken for histologic study.  The patient tolerated the  procedure well and was prepared for colonoscopy.  Digital rectal exam  revealed no abnormalities.   ENDOSCOPIC FINDINGS:  Prep was adequate.   COLON:  Colonic mucosa was surveyed from the rectosigmoid junction  through the left transverse right colon to the blind end of the cecum.  Ileocecal valve and cecum were well seen, photographed for the record.  I attempted to intubate the terminal ileum and was able to do so.  There  were bile stained bubbles coming out of the ileocecal valve.  The  appendiceal orifice was subtle in appearance.  From this level the scope  was slowly and cautiously withdrawn.  All previous mentioned mucosal  surfaces were again seen.  In the ascending segment just distal to the  ileocecal valve was a 1 cm multilobulated sessile polyp.  It was engaged  with snare and removed with one pass of hot snare with hot snare  technique.  It was suctioned out through the scope.  There were two  smaller polyps at the hepatic flexure which were cold snared.  She was  also noted to have extensive left-sided diverticula, however, the  remainder of the colonic mucosa appeared normal.  The scope was pulled  down into the rectum.  Rectal examination of rectal mucosa including  retroflexion of the anal verge, en face view of the anal canal  demonstrated friable anal canal only, did not see evidence of  inflammatory bowel disease although the mucosal surfaces  were somewhat  friable (the patient stopped Coumadin 4 days prior to the procedure).  The patient tolerated both procedures well and was reactive in  endoscopy.  Cecal withdrawal time 10 minutes.   IMPRESSION:  1. Normal esophagus status post passage of a 54 [place] Maloney      dilator.  2. Hiatal hernia, thick bile stained gastric mucus, fundal gland type      polyp status post cold biopsy, antral erosions and hyperemia of      uncertain significance status post biopsy, patent pylorus normal      D1, D2, question extrinsic mass effect on the body of the stomach      of uncertain clinical significance.  3. Colonoscopy findings, friable anal canal otherwise normal rectum,      left-sided diverticula.  Multiple colonic polyps resected as      described above.   RECOMMENDATIONS:  1. Resume Coumadin on June 13, 2008.  2. Anusol [facility] suppositories one per rectum at bedtime x10 days.  3. Daily Benefiber fiber supplement.  4. Proceed with a CT of the abdomen and pelvis to further evaluate the      extrinsic compression seen on the stomach today.  5. Continue Protonix 40 mg orally daily.  6. Follow up on path.  Further recommendations to follow.      Jonathon Bellows, M.D.  Electronically Signed     RMR/MEDQ  D:  06/12/2008  T:  06/12/2008  Job:  [id number]   cc:   Ladona Horns. Mariel Sleet, MD  Fax: [phone]   Vickki Hearing, M.D.  Fax: [phone]

## 2010-07-05 NOTE — Assessment & Plan Note (Signed)
[facility]                       [place] CARDIOLOGY OFFICE NOTE   NAME:[name], [name]                         MRN:          [id number]  DATE:12/04/2007                            DOB:          11/15/1929    PRIMARY CARE PHYSICIAN:  Patrica Duel, MD   REASON FOR VISIT:  Routine cardiac followup.   HISTORY OF PRESENT ILLNESS:  This is my first meeting with Ms. [name].  She is a pleasant elderly woman, previously followed by Dr. Tenny Craw and  last seen back in June 2009.  She has a history of permanent atrial  fibrillation managed with a strategy of heart rate control and  anticoagulation in the face of rheumatic heart disease, specifically  moderate mitral regurgitation and mild mitral stenosis with significant  left atrial enlargement.  My understanding is that she underwent an  evaluation in the past for possible surgical intervention, although it  was felt that her risk was high and she would be best managed medically.  She had a recent followup echocardiogram done on November 28, 2007,  demonstrating at least moderate mitral regurgitation with preserved left  ventricular systolic function, moderately to markedly dilated left  atrium, and upper normal right ventricular systolic pressure.  She has  NYHA class II to III dyspnea on exertion if she pushes it and  occasional palpitations.  Otherwise, heart rate control has been fairly  good on the present regimen.  She needs a refill for her diltiazem CD  and had her Coumadin checked today with adjustments made.  She is not  reporting any major bleeding problems.  Appetite is only fair, and she  does use Ensure supplements.   ALLERGIES:  DARVOCET.   PRESENT MEDICATIONS:  1. Asacol 400 mg p.o. b.i.d.  2. K-Dur 20 mEq p.o. daily.  3. Diltiazem CD 120 mg p.o. daily.  4. Dicyclomine 10 mg p.o. q.i.d.  5. Pantoprazole 40 mg p.o. daily.  6. Synthroid 75 mcg p.o. daily.  7. Coumadin as directed by the  [facility]in Clinic.  8. Propranolol 80 mg p.o. daily.  9. Chlorthalidone 25 mg one-half tablet p.o. daily.  10.Multivitamin one p.o. daily.   REVIEW OF SYSTEMS:  As described in the history of present illness.  She  does state that she has a broken foot after a fall and is wearing a  stiff boot.  She is ambulating with this.  Otherwise, negative.   PHYSICAL EXAMINATION:  VITAL SIGNS:  Blood pressure today is 120/82,  heart rate is 74  and irregular, weighs 127 pounds up from 121 back in  June.  GENERAL:  A pleasant woman in no acute distress.  NECK:  No elevated jugular venous pressure.  LUNGS:  Clear without labored breathing, somewhat diminished breath  sounds.  CARDIAC:  An irregularly irregular rhythm.  Soft systolic murmur towards  the apex.  No S3, gallop, or pericardial rub.  EXTREMITIES:  Exhibit no frank pitting edema.   IMPRESSION AND RECOMMENDATIONS:  1. Permanent atrial fibrillation managed with a strategy of heart rate      control and anticoagulation.  No specific  changes made today.      Cardizem CD was refilled.  Coumadin will continue to be adjusted      through the [facility]madin Clinic.  2. Rheumatic heart disease with mild mitral stenosis and at least      moderate mitral regurgitation associated with significant left      atrial enlargement and overall preserved left ventricular systolic      function.  This is being managed conservatively.  She is      symptomatically stable with New York Heart Association class II to      III dyspnea on exertion.  She appears to be euvolemic on      examination.  3. Previously documented normal coronary arteries at cardiac      catheterization in 2006.     Jonelle Sidle, MD  Electronically Signed    SGM/MedQ  DD: 12/04/2007  DT: 12/05/2007  Job #: [id number]

## 2010-07-05 NOTE — Procedures (Signed)
NAMELYN, JOENS                  ACCOUNT NO.:  [id number]   MEDICAL RECORD NO.:  [id number]          PATIENT TYPE:  OUT   LOCATION:  RAD                           FACILITY:  [facility]   PHYSICIAN:  Gerrit Friends. Dietrich Pates, MD, FACCDATE OF BIRTH:  04/14/1929   DATE OF PROCEDURE:  01/29/2007  DATE OF DISCHARGE:                                ECHOCARDIOGRAM   REFERRING PHYSICIAN:  Patrica Duel, M.D. and Pricilla Riffle, MD, [facility]   CLINICAL DATA:  A 77 year old woman with a history of rheumatic heart  disease and atrial fibrillation.   M-mode aorta 3.1, left atrium 3.6, septum 0.9, posterior wall 1.0, LV  diastole 3.9, LV systole 2.3, RV diastole 3.4.   1. Technically adequate echocardiographic study.  2. Moderate left atrial enlargement; mild to moderate right atrial      enlargement; normal right ventricular size and function.  3. Minimal sclerosis of the trileaflet aortic valve is noted; mild      aortic insufficiency present.  4. The aortic root is mildly dilated above the sinuses; mild      calcification of the wall and annulus is noted.  5. The mitral valve is moderately thickened and mildly calcified; mild      mitral annular calcification; there is decreased mobility in the      posterior leaflet and minimal restriction of the anterior leaflet      motion consistent with a rheumatic etiology for mitral valve      disease.  By Doppler, there is minimal stenosis and moderate      insufficiency.  6. Normal tricuspid valve; mild regurgitation; estimated RV systolic      pressure is upper normal to mildly increased.  7. Normal pulmonic valve and proximal pulmonary artery.  8. Normal IVC.  9. Normal internal dimension, wall thickness, regional and global      function of the left ventricle.  10.Comparison with prior study of November 30, 2005:  Increase in      severity of mitral regurgitation; aortic dilatation does not appear      as impressive as previously described.      Gerrit Friends. Dietrich Pates, MD, [facility]  Electronically Signed     RMR/MEDQ  D:  01/29/2007  T:  01/30/2007  Job:  [id number]

## 2010-07-05 NOTE — Assessment & Plan Note (Signed)
 [facility]                       [place] CARDIOLOGY OFFICE NOTE   NAME:[name]                         MRN:          [id number]  DATE:08/19/2007                            DOB:          1929/06/30    IDENTIFICATION:  [name] is a 77 year old woman with a history of  rheumatic valve disease, moderate mitral regurgitation, minimal MS, mild  aortic insufficiency, and intermittent AFib.  She was last seen back in  December.   Since seen, she still gets shortness of breath.  She thinks that Megace  may help.  She has been on it in the past, but also interested in  helping with her hot flashes.  She does feel a little dazed at times, no  true presyncope.  Also, notes some increased edema and has taken a whole  chlorthalidone for this periodically.   CURRENT MEDICINES:  1. Asacol 400 b.i.d.  2. Amitriptyline 100 b.i.d.  3. K-Dur 20 mEq daily.  4. Diltiazem 120.  5. Dicyclomine 10 q.i.d.  6. Protonix 40.  7. Synthroid 75 mcg.  8. Coumadin as directed.  9. Propranolol 80.  10.Chlorthalidone 12.5-25 daily.  11.Multivitamin.   PHYSICAL EXAMINATION:  GENERAL:  The patient is in no distress.  VITAL SIGNS:  Blood pressure 98/80, pulse 72 and regular, and weight 121  and stable.  NECK:  No JVD.  LUNGS:  Clear.  No rales.  CARDIAC:  Irregular rate and rhythm.  S1 and S2.  ABDOMEN:  Benign.  EXTREMITIES:  No edema.  Mild varicosities.   IMPRESSION:  1. Atrial fibrillation.  Continue on rate control.  Holter monitor      showed adequate rate control with an average beat of 77 beats per      minute, would continue.  2. Shortness of breath.  I am not convinced that there is any      significant change.  I told her she could try the Megace.   PLAN:  Continue on current regimen.  We will make sure has followup in  the winter.     Pricilla Riffle, MD, [facility]  Electronically Signed    PVR/MedQ  DD: 08/19/2007  DT: 08/20/2007  Job #:  [id number]   cc:   [name], M.D.

## 2010-07-05 NOTE — Assessment & Plan Note (Signed)
[facility]                       [place] CARDIOLOGY OFFICE NOTE   NAME:[name], [name]                         MRN:          [id number]  DATE:06/26/2008                            DOB:          08-06-1929    PRIMARY CARE PHYSICIAN:  Patrica Duel, MD   REASON FOR VISIT:  Scheduled followup.   HISTORY OF PRESENT ILLNESS:  [name] was seen back in October.  She  has had an eventful month undergoing recent endoscopy and colonoscopy  with dilatation of esophageal stricture and colonic polyp removal.  She  is also due to see a podiatrist soon.  She reports no major problem with  palpitations and has NYHA class II to III dyspnea on exertion which has  been stable.  She is not reporting any chest pain.  She had been on and  off Coumadin recently, just back on her prior dose for the last 2 days.  She has not had any bleeding problems.  No syncope.   ALLERGIES:  DARVOCET.   PRESENT MEDICATIONS:  1. Asacol 800 mg p.o. daily.  2. Potassium chloride 20 mEq p.o. daily.  3. Diltiazem CD 120 mg p.o. daily.  4. Dicyclomine 10 mg p.o. daily.  5. Pantoprazole 40 mg p.o. daily.  6. Synthroid 75 mcg p.o. daily.  7. Coumadin 2.5 mg daily.  8. Propranolol 80 mg p.o. daily.  9. Chlorthalidone 12.5 mg p.o. every other day.  10.Multivitamin 1 p.o. daily.  11.Omega-3 supplements daily.  12.Bentyl 25 mg one and half tablet p.o. every other day.  13.Carafate q.i.d. p.r.n.  14.Hydrocodone p.r.n.   REVIEW OF SYSTEMS:  As outlined above.  No falls.  Otherwise reviewed  negative.   PHYSICAL EXAMINATION:  VITAL SIGNS:  Blood pressure is 138/76, heart  rate is 82 and irregular, weight 125 pounds which is stable.  GENERAL:  A chronically ill-appearing woman in no acute distress.  NECK:  No elevated jugular venous pressure.  No loud bruits.  LUNGS:  Clear without labored breathing.  Diminished breath sounds  noted.  CARDIAC:  Irregularly irregular rhythm.  Soft  systolic murmur at the  apex.  No S3 or pericardial rub.  EXTREMITIES:  Exhibit no pitting edema.   IMPRESSION AND RECOMMENDATIONS:  1. Permanent atrial fibrillation.  We will continue Coumadin and      Cardizem CD.  Dose can be advanced as needed for heart rate      control, although she is not bothered by any major palpitations or      progressive shortness of breath.  We will arrange a follow up      through our Coumadin Clinic.  2. Rheumatic heart disease with mild mitral stenosis and at least      moderate mitral regurgitation associated with significant left      atrial enlargement and normal left ventricular systolic function.      This is being followed conservatively at this point.  She has NYHA      class II to III dyspnea on exertion.     Jonelle Sidle, MD  Electronically Signed  SGM/MedQ  DD: 06/26/2008  DT: 06/27/2008  Job #: [id number]   cc:   Patrica Duel, M.D.

## 2010-07-05 NOTE — Assessment & Plan Note (Signed)
NAMEALMETA, GEISEL                   CHART#:  [id number]   DATE:  05/24/2007                       DOB:  February 26, 1929   Formerly a patient of Dr. Dionicia Abler.  I saw this nice lady back on December 11, 2006.  She has a history of microscopic colitis and gastroesophageal  reflux disease.  She is on Coumadin for rheumatic valvular heart  disease.  She has done well on Asacol 2 tablets twice daily.  She has  altering constipation and diarrhea with irritable bowel syndrome, but  things have been far in the direction to constipation over the past few  months.  She continues to take dicyclomine two 10-mg tablets twice  daily.  She saw Dr. Sherwood Gambler recently, who bumped her Protonix up to 40 mg  orally twice daily for breakthrough reflux symptoms.  This has been  associated with marked improvement in those symptoms, and she is quite  pleased with how things are going.  I do not see where she has had a  BMET recently to assess creatinine function while on mesalamine therapy.  Her last colonoscopy was in 2003 where she was found to have sigmoid  thick-skinned microscopic colitis, which was confirmed on biopsy.   CURRENT MEDICATIONS:  See updated list.   ALLERGIES:  CILLINS.   EXAMINATION:  A 77 year old lady, pleasant, in no acute distress.  Weight 123.  Height 5 feet 3 inches.  Temperature 97.9.  Blood pressure  106/76.  Pulse 84.  SKIN:  Warm and dry.  HEENT EXAM:  No scleral icterus.  ABDOMEN:  Non-distended.  Positive bowel sounds.  Soft and nontender  without appreciable mass or organomegaly.  EXTREMITY EXAM:  No edema.   IMPRESSION:  Microscopic colitis in remission.  She has irritable bowel  syndrome with constipation predominance these days.  Her  gastroesophageal reflux disease symptoms are now well controlled on  b.i.d. Protonix.   I am concerned about this lady's dicyclomine therapy.  She is really  taking too much given that she has a tendency towards more constipation  these days.   RECOMMENDATIONS:  1. Continue Protonix 40 mg orally twice daily.  2. Continue Asacol 2 tablets orally b.i.d.  3. Would reserve dicyclomine for periods of diarrhea only, taking it      other times may exacerbate constipation.  4. Unless something comes up, we will plan to see this nice lady back      in 6 months.  We will check a BMET to make sure her creatinine is      okay.       Jonathon Bellows, M.D.  Electronically Signed     RMR/MEDQ  D:  05/24/2007  T:  05/24/2007  Job:  [id number]   cc:   Gerrit Friends. Dietrich Pates, MD, [facility]  Madelin Rear. Sherwood Gambler, MD

## 2010-07-05 NOTE — Assessment & Plan Note (Signed)
[facility]                       [place] CARDIOLOGY OFFICE NOTE   NAME:[name], [name]                         MRN:          [id number]  DATE:01/21/2007                            DOB:          Aug 05, 1929    IDENTIFICATION:  Ms. [name] is a 77 year old woman with a history of  rheumatic valve disease, mild mitral regurgitation, mild aortic  insufficiency by transthoracic echo, intermittent atrial fibrillation,  last seen in October of last year.   In the interval, she has done about the same.  She said she gives out  some with walking.  She gets occasional chest pressure.  She had some on  Sunday.  It occurred while she was sitting and laying down.  No chest  pain with walking.  Denies palpitations.   CURRENT MEDICATIONS:  1. Os-Cal 400 b.i.d.  2. Amitriptyline 10 mg b.i.d.  3. K-Dur 20 mEq daily.  4. Diltiazem 120.  5. Dicyclomine 10 q.i.d.  6. Protonix 40.  7. Synthroid 75 mcg.  8. Chlorthalidone 2.  9. Coumadin 2.5.  10.Propranolol 80.   PHYSICAL EXAMINATION:  GENERAL:  The patient is in no distress.  VITAL SIGNS:  Blood pressure is 116/80, pulse is 80 and irregular,  weight 119.  NECK:  JVP is normal.  No bruits.  LUNGS:  Clear.  CARDIAC:  Irregularly irregular, S1 S2, no S3, a grade 1/6 systolic  murmur.  ABDOMEN:  Benign.  EXTREMITIES:  No edema.   IMPRESSION:  1. Atrial fibrillation.  EKG today confirms that she is in atrial      fibrillation at a rate of 81 beats per minute.  I would recommend a      Holter monitor to see what she does in a 24-hour period.  2. Dyspnea.  Again, I am not convinced there is any active cardiac      issues.  She had a catheterization in 2006, again we will look at      rate control.  I will also set her up for a followup echocardiogram      to evaluate her valvular function.  Her regurgitation does not      sound severe on exam today.  Continue on Coumadin.  INR is      therapeutic today.   Otherwise, I will set followup for the summer, sooner if problems  develop.     Pricilla Riffle, MD, [facility]  Electronically Signed    PVR/MedQ  DD: 01/21/2007  DT: 01/21/2007  Job #: [id number]   cc:   Patrica Duel, M.D.

## 2010-07-05 NOTE — Assessment & Plan Note (Signed)
[name], COUNTS                   CHART#:  [id number]   DATE:  12/11/2006                       DOB:  07/09/1929   REASON FOR VISIT:  Follow up abdominal pain, diarrhea, history of  microscopic colitis, weight loss.  She was last seen 07/27/2006 by Dr.  Dionicia Abler.  She has chosen to stay here to continue with her GI care in the  wake of Dr. Inge Rise departure.  She has done fairly well from a GI  standpoint.  She has been maintained on Asacol 2 tablets b.i.d. for some  time now and her microscopic colitis remains in remission.  She also  takes dicyclomine 2 tablets twice daily and, in fact, she really has not  had diarrhea in some time and she does cite some abdominal bloating and  constipation underlying.  She may go a day or so without a bowel  movement.  She has not had any rectal bleeding.  Reflux symptoms are  well controlled.  With Prevacid she has significant globe of symptoms  from time to time and has a mild esophageal motility disorder based on  prior barium swallow.   She was given some samples of Prevacid previously by Dr. Dionicia Abler  associated with marked improvement in swallowing and her globe of  symptoms but she is out, cannot afford to buy trade name drug.  She  tells me that Dr. Dorethea Clan previously told her she has advanced valvular  heart disease and only has a couple of more years in which to live.   She has not had an nausea or vomiting.  Her weight is down 2 pounds but  states her appetite is fairly well maintained.  She did have a  colonoscopy back in 05/2001 which demonstrated a few sigmoid  diverticula, otherwise endoscopically normal lower GI tract. Sigmoid  biopsy were suggestive of microscopic colitis.  She related antibiotic  panel previously came back negative.  She has a personal history of  breast cancer, has seen Dr. Elmer Sow. Dorna Bloom previously.   CURRENT MEDICATIONS:  See updated list.   ALLERGIES:  CILLINS all.   PHYSICAL EXAMINATION:  GENERAL:  Reveals a  referral 77 year old lady  resting comfortably.  VITAL SIGNS:  Weight 118, height 5 feet 3 inches, temperature 97.6, BP  112/80, pulse 80.  SKIN:  Warm and dry. There is no jaundice.  HEENT EXAM:  No scleral icterus, conjunctivae pink.  CHEST:  Lungs are clear to auscultation.  CARDIAC EXAM:  Regular rate and rhythm with 2/6 systolic ejection  murmur.  ABDOMEN:  Flat, positive bowel sounds, soft, nontender, without  appreciable mass or organomegaly.  EXTREMITIES:  There is no lower extremity edema.   ASSESSMENT:  Abdominal pain and diarrhea, history of microscopic  colitis.  Those symptoms are really in remission on Asacol and  dicyclomine 2 tablets twice daily.  In fact, she is constipated and  complains of abdominal bloating which I feel we have likely overshot our  end point with antimotility therapy to combat diarrhea.  Recommendations  to deal with this include backing off on the Bentyl to no more than 1  tablet twice daily and to use only as needed for diarrhea.  I would like  her to continue the Asacol 2 tablets orally twice daily.  Globus  neuro/dysphagia symptoms responded  previously to Proton pump inhibitor.  She asked if she could have something less expensive in generic line.  I  have given her a prescription for Protonix 40 mg orally daily.  This  should help and should not affect her other medications including  Coumadin.  We will go ahead and give her 30 Prevacid 30 mg capsules to  take in the interim.  Unless something comes up, plan to see this nice  lady back in 6 months for recheck.       Jonathon Bellows, M.D.  Electronically Signed     RMR/MEDQ  D:  12/11/2006  T:  12/12/2006  Job:  [id number]   cc:   Patrica Duel, M.D.

## 2010-07-08 ENCOUNTER — Emergency Department (HOSPITAL_COMMUNITY)
Admission: EM | Admit: 2010-07-08 | Discharge: 2010-07-08 | Disposition: A | Discharge status: Home / Self Care | Payer: Medicare Other | Attending: Emergency Medicine | Admitting: Emergency Medicine

## 2010-07-08 ENCOUNTER — Emergency Department (HOSPITAL_COMMUNITY): Payer: Medicare Other

## 2010-07-08 DIAGNOSIS — N289 Disorder of kidney and ureter, unspecified: Secondary | ICD-10-CM | POA: Insufficient documentation

## 2010-07-08 DIAGNOSIS — Z853 Personal history of malignant neoplasm of breast: Secondary | ICD-10-CM | POA: Insufficient documentation

## 2010-07-08 DIAGNOSIS — I4891 Unspecified atrial fibrillation: Secondary | ICD-10-CM | POA: Insufficient documentation

## 2010-07-08 DIAGNOSIS — I10 Essential (primary) hypertension: Secondary | ICD-10-CM | POA: Insufficient documentation

## 2010-07-08 DIAGNOSIS — Z79899 Other long term (current) drug therapy: Secondary | ICD-10-CM | POA: Insufficient documentation

## 2010-07-08 DIAGNOSIS — Z85820 Personal history of malignant melanoma of skin: Secondary | ICD-10-CM | POA: Insufficient documentation

## 2010-07-08 DIAGNOSIS — E860 Dehydration: Secondary | ICD-10-CM | POA: Insufficient documentation

## 2010-07-08 LAB — CBC
Hemoglobin: 12.8 g/dL (ref 12.0–15.0)
MCH: 30.8 pg (ref 26.0–34.0)
MCHC: 34 g/dL (ref 30.0–36.0)
MCV: 90.8 fL (ref 78.0–100.0)
RBC: 4.15 MIL/uL (ref 3.87–5.11)

## 2010-07-08 LAB — DIFFERENTIAL
Basophils Relative: 0 % (ref 0–1)
Eosinophils Absolute: 0.2 10*3/uL (ref 0.0–0.7)
Lymphs Abs: 2.5 10*3/uL (ref 0.7–4.0)
Monocytes Absolute: 0.7 10*3/uL (ref 0.1–1.0)
Monocytes Relative: 8 % (ref 3–12)
Neutro Abs: 5.3 10*3/uL (ref 1.7–7.7)
Neutrophils Relative %: 61 % (ref 43–77)

## 2010-07-08 LAB — BASIC METABOLIC PANEL
BUN: 30 mg/dL — ABNORMAL HIGH (ref 6–23)
CO2: 31 mEq/L (ref 19–32)
Glucose, Bld: 105 mg/dL — ABNORMAL HIGH (ref 70–99)
Potassium: 3.9 mEq/L (ref 3.5–5.1)
Sodium: 134 mEq/L — ABNORMAL LOW (ref 135–145)

## 2010-07-08 LAB — URINALYSIS, ROUTINE W REFLEX MICROSCOPIC
Bilirubin Urine: NEGATIVE
Glucose, UA: NEGATIVE mg/dL
Hgb urine dipstick: NEGATIVE
Protein, ur: NEGATIVE mg/dL
Urobilinogen, UA: 0.2 mg/dL (ref 0.0–1.0)

## 2010-07-08 LAB — PROTIME-INR: Prothrombin Time: 15.1 seconds (ref 11.6–15.2)

## 2010-07-08 NOTE — Assessment & Plan Note (Signed)
[facility]                              CARDIOLOGY OFFICE NOTE   NAME:Rotert, DARRION MACAULAY                         MRN:          [id number]  DATE:11/23/2005                            DOB:          1930-02-19    IDENTIFICATION:  Ms. Dome is a 77 year old woman with a history of  rheumatic heart disease.  She was last seen in cardiology clinic back in  February.  She has mild mitral regurgitation, no significant stenosis though  her leaflets are markedly thickened on TEE.  She also has mild aortic  insufficiency.  There are some conflicting information with the  transthoracic echo in June showing severe MR but the TEE did not show this.  Atria are both noted to be markedly dilated.   Since seeing the patient has done fairly well.  She denies shortness of  breath with usual activities, says it cuts off sometimes but no real change,  has had intermittent chest pressure on the left which she says is my heart  pain.  It can occur though with and without activity but no real increase.  Says she is weak a lot but this is no real change.   CURRENT MEDICATIONS:  1. Asacol 400 b.i.d.  2. Centrum Silver daily.  3. Amitriptyline b.i.d.  4. K-Dur 20 mEq daily.  5. Diltiazem 120 daily.  6. Dicyclomine 10 daily.  7. Synthroid 50 mcg daily.  8. Chlorthalidone 1/2 tab every other day.  9. Coumadin as directed.  10.Inderal 80 daily.   PHYSICAL EXAMINATION:  The patient is in no distress.  Blood pressure is  90/68, pulse is 66 with occasional skip heard, weight is 119.  LUNGS:  Clear.  NECK:  JVP is normal.  CARDIAC EXAM:  Regular rate and rhythm with occasional skip, grade 1/6  systolic murmur heard best at the apex,?soft diastolic murmur also heard  there.  No diastolic murmurs heard at the left external border.  ABDOMEN:  No hepatosplenomegaly.  EXTREMITIES:  No edema.   DATA:  INR today is 2.4.   IMPRESSION:  1. Ms. Gaines is a 77 year old with rheumatic  heart disease, intermittent      atrial fibrillation, clinically doing about the same.  I would like to      get an echocardiogram to reevaluate her valves, especially the mitral      valve.  Her exam is not too remarkable but her jet may be eccentric.      Will review.   Continue current regimen, follow up in the Spring, sooner if problems  develop.  1. Atrial fibrillation.  I did not get an EKG today.  She has had sinus in      the past with intermittent atrial fibrillation, would follow.  No      change in regimen.            ______________________________  Pricilla Riffle, MD, [facility]     PVR/MedQ  DD:  11/23/2005  DT:  11/24/2005  Job #:  [id number]

## 2010-07-08 NOTE — Discharge Summary (Signed)
NAME:  [name], [name]                            ACCOUNT NO.:  [id number]   MEDICAL RECORD NO.:  [id number]                   PATIENT TYPE:  INP   LOCATION:  A306                                 FACILITY:  [facility]   PHYSICIAN:  Dalia Heading, M.D.               DATE OF BIRTH:  Jun 25, 1929   DATE OF ADMISSION:  08/03/2003  DATE OF DISCHARGE:  08/05/2003                                 DISCHARGE SUMMARY   HOSPITAL COURSE:  The patient is a 77 year old, white female, status post a  right partial mastectomy with axillary dissection in the past for right  breast carcinoma who presented with right mastopathy secondary to radiation.  Due to intractable pain and discomfort, the patient presented for a right  simple mastectomy.  This was performed on August 03, 2003.  She tolerated the  procedure well.  Her postoperative course was remarkable only for anemia  secondary to the surgery.  Her vital signs were stable and she has been  ambulatory without difficulty.  Her Jackson-Pratt drain was removed on  postop day #2.  She is being discharged home in fair and stable condition.   FOLLOW UP:  The patient is to follow up with Dr. Franky Macho on August 11, 2003.   DISCHARGE MEDICATIONS:  1. Vicodin one to two tablets p.o. q.6h. p.r.n. pain.  2. Iron supplements one tablet p.o. b.i.d.  3. Resume all other medications as previously prescribed.   DISCHARGE DIAGNOSES:  1. 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  2. History of right breast carcinoma.  3. Hypertension.  4. Irritable bowel syndrome .  5. History of melanoma.  6. Hypothyroidism.   PROCEDURE:  Right simple mastectomy on August 03, 2003.     ___________________________________________                                         Dalia Heading, M.D.   MAJ/MEDQ  D:  08/05/2003  T:  08/05/2003  Job:  [id number]   cc:   Patrica Duel, M.D.  [address], [address]  [place]  [place] [address]  Fax: [phone]   Ladona Horns.  Neijstrom, MD  [address]  [place]  [place] [address]  Fax: [phone]   Elmer Sow. Dorna Bloom, M.D.  [address] - [facility]  [place]  [place] [address]  Fax: [phone]

## 2010-07-08 NOTE — H&P (Signed)
[name], [name]                              ACCOUNT NO.:  [id number]   MEDICAL RECORD NO.:  [id number]                  PATIENT TYPE:   LOCATION:                                       FACILITY:   PHYSICIAN:  Dalia Heading, M.D.               DATE OF BIRTH:  Oct 29, 1929   DATE OF ADMISSION:  08/03/2003  DATE OF DISCHARGE:                                HISTORY & PHYSICAL   CHIEF COMPLAINT:  Right mastopathy, history of right breast carcinoma.   HISTORY OF PRESENT ILLNESS:  Patient is a 77 year old white female status  post a right partial mastectomy with sentinel lymph node biopsy in 1999, who  presents with chronic right mastopathy.  This has been occurring since she  underwent radiation therapy.  The pain has become intolerable, and the  patient would like to proceed with a right simple mastectomy for pain  control.  Her last mammogram was six months ago, which was reportedly  negative for malignancy.  She denies any nipple discharge.   PAST MEDICAL HISTORY:  1. Right breast carcinoma.  2. Hypertension.  3. Irritable bowel syndrome.  4. History of melanoma.  5. Hypertension.  6. Hypothyroidism.   PAST SURGICAL HISTORY:  As noted above.  Sphincterotomy for anal stenosis in  1997.  Total abdominal hysterectomy.  Excision of melanoma of the back.  Tonsillectomy.  Hemorrhoidectomy in the remote past.   CURRENT MEDICATIONS:  1. Maxalt 10 mg p.o. p.r.n.  2. Asacol 2 tablets q.a.m. and q.p.m.  3. Synthroid 50 mcg p.o. q.d.  4. Potassium supplements 20 mEq p.o. q.d.  5. Inderal 160 mg p.o. q.d.  6. Hygroton 12.5 mg p.o. q.o.d.  7. Norvasc 5 mg p.o. q.d.  8. Megace 1/2 tablet q.d.  9. Elavil 20 mg p.o. q.h.s.  10.      Bentyl 10 mg p.o. b.i.d.   ALLERGIES:  No known drug allergies.   REVIEW OF SYSTEMS:  Patient denies any recent chest pain, MI, CVA, diabetes  mellitus, or bleeding disorders.  She denies drinking or smoking.   PHYSICAL EXAMINATION:  VITAL SIGNS:  She is  afebrile.  Vital signs are  stable.  GENERAL:  Patient is a pleasant 77 year old white female who is well-  developed and well-nourished in no acute distress.  LUNGS:  Clear to auscultation with equal breath sounds bilaterally.  HEART:  Regular rate and rhythm without S3, S4, or murmurs.  BREASTS:  The left breast examination reveals no dominant mass, nipple  discharge, or dimpling.  The axilla is negative for palpable nodes.  The  right breast examination reveals a tender area in the right breast in the  outer lower quadrant of the breast with scarring present.  No ulcerations  are noted.  There are mild skin changes and sclerosis secondary to radiation  therapy.  The axilla is negative for palpable nodes.   IMPRESSION:  Chronic right mastopathy secondary to irradiation of the right  breast.  History of right breast carcinoma.   PLAN:  Patient is scheduled for a right simple mastectomy on August 03, 2003.  The risks and benefits of the procedure, including bleeding, infection, and  the possibility of cardiopulmonary difficulties, were fully explained to the  patient, who gave informed consent.     ___________________________________________                                         Dalia Heading, M.D.   MAJ/MEDQ  D:  07/16/2003  T:  07/16/2003  Job:  [id number]   cc:   Patrica Duel, M.D.  [address], [address]  [place]  [place] [address]  Fax: [phone]   Ladona Horns. Neijstrom, MD  [address]  [place]  [place] [address]  Fax: [phone]   Elmer Sow. Dorna Bloom, M.D.  [address] - [facility]  [place]  [place] [address]  Fax: [phone]

## 2010-07-08 NOTE — Procedures (Signed)
NAMEGWENITH, [name]                  ACCOUNT NO.:  [id number]   MEDICAL RECORD NO.:  [id number]          PATIENT TYPE:  AMB   LOCATION:  DAY                           FACILITY:  [facility]   PHYSICIAN:  Vida Roller, M.D.   DATE OF BIRTH:  [birth year]   DATE OF PROCEDURE:  11/03/2004  DATE OF DISCHARGE:                                  ECHOCARDIOGRAM   HISTORY OF PRESENT ILLNESS:  Ms. Lech is a 77 year old woman who has known  severe mitral regurgitation who had an episode of syncope and is felt to  have a rheumatic valve, and this is an evaluation for severity of the mitral  regurgitation.   DETAILS OF PROCEDURE:  The patient was brought to the day surgery suite  where she was placed in the left lateral decubitus position. Continuous  electrocardiographic pulse oximetry and blood pressure monitoring was  obtained. The patient had an adequate IV placed, and her oropharynx was  anesthetized using Hurricaine spray. After appropriate amelioration of her  gag reflux was documented, the patient received conscious sedation with a  total of 3 mg of Versed, 50 mg of fentanyl in graduated doses. After  conscious sedation was obtained, the transesophageal echocardiogram probe  was placed initially in the transgastric position and then in the mid  esophageal position to obtain echocardiographic images. At the conclusion of  the imaging, the probe was removed. The patient's oropharynx was suctioned  with the suction device , and she was recovered as per the protocol of the  unit.   RESULTS:  Her left ventricle is normal size with normal systolic function.  Estimated ejection fraction is 60 to 65%. No obvious wall motion  abnormalities were seen.   Right ventricle appears to be normal size with normal systolic function.   Both atria are markedly enlarged. There is no atrial septal defect by color  flow. The left atrial appendage appears reasonably small and does not have  evidence of  thrombus.   The aortic valve is sclerotic with evidence of aortic insufficiency which is  graded as mild. It is central. There is thickening of the leaflet tips, but  the valve appears to open normally. There is no evidence of aortic stenosis.   The mitral valve is markedly thickened, both in its subvalvular apparatus as  well as its valve leaflets. There is moderate mitral regurgitation seen in  multiple views. There are multiple jets of mitral regurgitation. One jet  appears to be directed inferolaterally towards the pulmonary veins. There  does not appear to be significant biphasic flow in the pulmonary veins by  color flow Doppler. There is approximately 2 mmHg gradient across the valve  with an estimated valve area by pressure half time of 2.0 cm2.   The tricuspid valve is delicate with mild regurgitation. No stenosis is  seen.   The pulmonic valve was not interrogated.   There did not appear to be a significant pericardial effusion.      Vida Roller, M.D.  Electronically Signed     JH/MEDQ  D:  11/03/2004  T:  11/03/2004  Job:  [id number]

## 2010-07-08 NOTE — Op Note (Signed)
[name], [name]                            ACCOUNT NO.:  [id number]   MEDICAL RECORD NO.:  [id number]                   PATIENT TYPE:  AMB   LOCATION:  DAY                                  FACILITY:  [facility]   PHYSICIAN:  Dalia Heading, M.D.               DATE OF BIRTH:  07-27-1929   DATE OF PROCEDURE:  08/03/2003  DATE OF DISCHARGE:                                 OPERATIVE REPORT   PREOPERATIVE DIAGNOSIS:  Right breast carcinoma, radiation-induced  mastopathy.   POSTOPERATIVE DIAGNOSIS:  Right breast carcinoma, radiation-induced  mastopathy.   PROCEDURE:  Right simple mastectomy.   SURGEON:  Dalia Heading, M.D.   ANESTHESIA:  General endotracheal.   INDICATIONS FOR PROCEDURE:  The patient is a 77 year old white female who is  status post a right partial mastectomy approximately five years ago who had  postoperative radiation.  She now has chronic right mastopathy due to the  radiation and also a density in the right breast which is of concern to her.  She now presents for a right simple mastectomy.  The risks and benefits of  the procedure, including bleeding, infection, and cardiopulmonary  difficulties were fully explained to the patient who gave informed consent.   DESCRIPTION OF PROCEDURE:  The patient was placed in the supine position.  After induction of general endotracheal anesthesia, the right breast was  prepped and draped using the usual sterile technique with Betadine.  Surgical site confirmation was performed.   An elliptical incision was made medial to lateral around the nipple.  The  dissection was taken down to the chest wall.  The superior flap was formed  up to the clavicle and the inferior flap formed to the chest wall.  The  breast was then removed along with the underlying fascia from medial to  lateral.  A single suture was placed laterally on the right breast specimen.  The specimen was then sent to pathology for further examination.  Any  bleeding was controlled using Bovie electrocautery.  The wound was irrigated  with normal saline.  A #10 flat Jackson-Pratt drain was placed below the  flap and secured at its exit site using a 3-0 nylon suture.  The  subcutaneous layer was reapproximated using a 3-0 Vicryl interrupted suture.  The skin was closed using staples.  Betadine ointment and a dry sterile  dressing were applied.   All tape and needle counts were correct at the end of the procedure.  The  patient was extubated in the operating room and went back to the recovery  room awake and in stable condition.   COMPLICATIONS:  None.   SPECIMENS:  Right breast.   ESTIMATED BLOOD LOSS:  Less than 100 cc.   DRAINS:  Jackson-Pratt drain to flap.      ___________________________________________  Dalia Heading, M.D.   MAJ/MEDQ  D:  08/03/2003  T:  08/03/2003  Job:  [id number]   cc:   Patrica Duel, M.D.  [address]  [place]  [place] [address]  Fax: [phone]   Elmer Sow. Dorna Bloom, M.D.  [address] - [facility]  [place]  [place] [address]  Fax: [phone]   Ladona Horns. Neijstrom, MD  [address]  [place]  [place] [address]  Fax: [phone]

## 2010-07-08 NOTE — Procedures (Signed)
[name], [name]                  ACCOUNT NO.:  [id number]   MEDICAL RECORD NO.:  [id number]          PATIENT TYPE:  OUT   LOCATION:  RAD                           FACILITY:  [facility]   PHYSICIAN:  Vida Roller, M.D.   DATE OF BIRTH:  12-09-1929   DATE OF PROCEDURE:  DATE OF DISCHARGE:                                  ECHOCARDIOGRAM   PRIMARY CARE PHYSICIAN:  Patrica Duel, M.D.   PROCEDURE:  Echocardiogram.   TAPE NUMBER:  [id number].   TAPE COUNT:  [id number].   INDICATIONS FOR PROCEDURE:  This is a woman with a history of rheumatic  fever who has atrial fibrillation and hypertension.   DESCRIPTION OF PROCEDURE:  The technical quality of the study is good.   M-mode tracing of the aorta is 27 mm.   The left atrium is greater than 50 mm.   The septum is 10 mm.   The posterior wall is 11 mm.   The left ventricular diastolic dimension is 40 mm.   The left ventricular systolic dimension is 23 mm.   2-D AND DOPPLER IMAGING:  The left ventricle is a normal size with a normal  systolic function.  Estimated ejection fraction is 65-70%.  There are no  obvious wall motion abnormalities.   The right ventricle is a normal size and normal systolic function.   Both atria are markedly dilated.   The aortic is sclerotic with mild-to-moderate aortic insufficiency.  No  stenosis is seen.   The mitral valve is clearly rheumatic with significant impaired leaflet  motion.  There is mild mitral stenosis with a mean gradient of 4 mmHg.  The  valve area by the pressure half time equation is difficult to assess.  There  is severe mitral regurgitation.   The tricuspid valve is also slightly thickened with moderate tricuspid  regurgitation.   The pulmonic valve is not well seen.   The aortic root appears to be dilated significantly above the sinuses of  Valsalva.   The atrial vena cava appears to be small.   There is no obvious pericardial effusion.       JH/MEDQ  D:  08/04/2004   T:  08/04/2004  Job:  [id number]

## 2010-07-08 NOTE — Procedures (Signed)
[name], [name]                  ACCOUNT NO.:  [id number]   MEDICAL RECORD NO.:  [id number]          PATIENT TYPE:  OUT   LOCATION:  RAD                           FACILITY:  [facility]   PHYSICIAN:  Peter C. Eden Emms, MD, FACCDATE OF BIRTH:  November 16, 1929   DATE OF PROCEDURE:  DATE OF DISCHARGE:                                  ECHOCARDIOGRAM   INDICATIONS:  1. Follow up aortic insufficiency.  2. History of hypertension.  3. Rheumatic are disease.   Left ventricular cavity size was normal.  EF was 60% .  There were no  regional wall motion abnormalities.   The aortic valve was trileaflet.  There was mild to moderate aortic root  dilatation.  There was mild to moderate aortic insufficiency.   Mitral valve was thickened.  There was mild mitral insufficiency. There was  moderate left atrial enlargement.  There was no significant mitral stenosis.   Right-sided cardiac chambers were normal.  The subcostal imaging revealed a  small pericardial effusion.   There was no source of embolus and no atrial septal defect.   IMPRESSION:  1. Normal ventricular function, ejection fraction 60%.  2. Mild to moderate aortic insufficiency with moderate aortic root      dilatation.  3. Mild mitral regurgitation with thickened mitral valve but no stenosis.  4. Moderate left atrial enlargement.  5. Normal right ventricle.  6. Small pericardial effusion.           ______________________________  Noralyn Pick Eden Emms, MD, [facility]     PCN/MEDQ  D:  12/01/2005  T:  12/02/2005  Job:  [id number]   cc:   Patrica Duel, M.D.  Fax: [phone]

## 2010-07-08 NOTE — Assessment & Plan Note (Signed)
[name], [name]                   CHART#:  [id number]   DATE:  May 09, 1929                       DOB:  09-15-1929   PRESENTING COMPLAINTS:  Follow for abdominal pain, diarrhea and weight  loss.   SUBJECTIVE:  The patient is a 77 year old Caucasian female who is here  for scheduled visit.  She was last seen on 04/06/06.  She was not doing  well at that time.  She weighed 114 pounds.  I felt that she was  depressed.  She was begun on low dose Elavil.  She took it for a while  but decided to discontinue.  She states she is feeling better.  She has  gained 6 pounds since her last visit.  She states on days when she does  not eat she drinks as many as 3-4 cans of Ensure and does not experience  any diarrhea.  She states she had to have cataract surgery since her  last visit.  She had left eye done 6 weeks ago and right eye 3 weeks  ago.  She also broke off a tooth and is seeing her dentist.  She states  her mammography was normal.  She states she has done well as far as her  bowel habits and abdominal pain is concerned although she noticed some  bloating today.  Her bowels move daily or every other day.  She has not  had melena, rectal bleeding or diarrhea.  She states her swallowing  difficulty is about the same.  She has noted improvement with Prevacid  and would like to get some samples.  She says she cannot afford this  medicine.   She is on Synthroid 75 mcg daily, Asacol 400 mg b.i.d., Dicyclomine 10  mg b.i.d., Diltiazem 120 mg daily,  KCL 20 mEq daily,  Propranolol 80 mg  daily, Hygroton 12.5 mg qod and Coumadin 2.5 mg 4 days each week and  1.25 mg 3 days each week.   OBJECTIVE:  Weight 120 pounds. She is 5 feet 3 inches tall.  Pulse is 64  per minute.  Blood pressure 110/80, temp is 97.4.  She appears  comfortable.  Conjunctivae is pink, sclerae is nonicteric.  Oropharyngeal mucosa is normal.  JVD is not elevated.  Her lungs are  clear to auscultation.  Her abdomen is  symmetrical, bowel sounds are  normal.  She has some mild tenderness across lower abdomen but no  organomegaly or masses noted.  She does not have peripheral edema.   ASSESSMENT:  1. Chronic abdominal pain and diarrhea.  She has undergone extensive      workup in the past.  She has microscopic colitis as well as IBS and      doing fairly well with therapy.  2. Weight loss.  She has gained 6 pounds since her last visit over 3      months ago which is very reassuring.  3. GERD.  She also has oropharyngeal dysphagia which is stable.  She      has had a barium study revealing laryngeal penetration with thick      liquids as well as vallecular and piriform fossa pooling but      luckily this is not getting worse.  We will continue to monitor      her.   She  also has a history of valvular heart disease (2 valves involved) and  CHF and is under care of  Cardiologists.   PLAN:  The patient given samples of Prevacid.  She can take 30 mg q.  a.m. on a p.r.n. basis.   New prescription given for dicyclomine 10 mg b.i.d. 60 with 5 refills.  She will continue Asacol as before.  She will return for OV in 6 months  or earlier should she experience relapse of her symptoms.       Lionel December, M.D.  Electronically Signed     NR/MEDQ  D:  07/26/2006  T:  07/26/2006  Job:  [id number]   cc:   Noralyn Pick. Eden Emms, MD, [facility]  [place]anker, [place]

## 2010-07-08 NOTE — Cardiovascular Report (Signed)
[name], [name]                  ACCOUNT NO.:  [id number]   MEDICAL RECORD NO.:  [id number]          PATIENT TYPE:  OIB   LOCATION:  1965                         FACILITY:  [facility]   PHYSICIAN:  Charlton Haws, M.D.     DATE OF BIRTH:  [birth year]   DATE OF PROCEDURE:  11/07/2004  DATE OF DISCHARGE:  11/07/2004                              CARDIAC CATHETERIZATION   Right and left heart catheterization.   INDICATIONS:  A 74-year patient with rheumatic heart disease. Echocardiogram  and TEE suggesting mild to moderate aortic insufficiency, severe MR and mild  MS.   Catheterization was done from the right femoral artery and vein. An 8-French  venous sheath was used for simultaneous LV pulmonary capillary wedge  tracing. A 5-French arterial sheath was used.   The patient was in sinus rhythm during the case. Her INR was 1.1 and her  Coumadin had been held since last Wednesday.   Left main coronary artery was normal.   Left anterior descending artery was normal. There was a large intermediate  branch which was normal.   Circumflex coronary artery was nondominant and normal.   Right coronary artery was dominant and normal.   Mean right atrial pressure was 3. RV pressure was 29/15. PA pressures 28/13.  Mean pulmonary capillary wedge pressure was 13. LV pressure was 149/15.  Aortic pressure was one 141/70. Cardiac output was somewhat low at 2.6  liters per minute.   The calculated mitral valve area by Gorlin equation was 1.65 cm2. This may  be somewhat low due to the low cardiac output seen by Fick. Mean gradient  was 6 mmHg by simultaneous LV wedge.   RAO ventriculography showed normal LV function with only mild mitral  insufficiency. There was no significant gradient across the aortic valve on  pullback and aortic root injection showed only mild aortic root dilatation  with very mild aortic insufficiency.   IMPRESSION:  There is somewhat conflicting data. The patient's TEE  suggested  more significant mitral and aortic insufficiency. The patient does not have  any significant pulmonary hypertension and is maintaining normal sinus  rhythm. Her pressures and left ventriculogram would not indicate severe  mitral insufficiency.   She does have some mitral stenosis which may lead to increasing shortness of  breath at high heart rates.   The patient will follow-up with Dr. Dorethea Clan to correlate her symptoms with  her echocardiogram and transesophageal echocardiogram. Notes should be made,  however, in regards to possible surgery that the patient has no local  support. She lives by herself.   She will resume her Coumadin later tonight and follow-up for groin check in  the office on Wednesday. She normally gets her Coumadin checked there and I  suspect she will need to come back the following week as well once her  Coumadin has been reestablished.           ______________________________  Charlton Haws, M.D.     PN/MEDQ  D:  11/07/2004  T:  11/07/2004  Job:  [id number]   cc:   [facility]   Vida Roller, M.D.  Fax: [phone]   Patrica Duel, M.D.  [address]  [place]  [place] [address]  Fax: [phone]

## 2010-07-20 ENCOUNTER — Encounter (HOSPITAL_COMMUNITY): Payer: Medicare Other | Attending: Oncology | Admitting: Oncology

## 2010-07-20 DIAGNOSIS — C50919 Malignant neoplasm of unspecified site of unspecified female breast: Secondary | ICD-10-CM

## 2010-07-21 ENCOUNTER — Other Ambulatory Visit (HOSPITAL_COMMUNITY): Payer: Self-pay | Admitting: Internal Medicine

## 2010-07-21 DIAGNOSIS — Z139 Encounter for screening, unspecified: Secondary | ICD-10-CM

## 2010-08-01 ENCOUNTER — Ambulatory Visit (HOSPITAL_COMMUNITY)
Admission: RE | Admit: 2010-08-01 | Discharge: 2010-08-01 | Disposition: A | Discharge status: Home / Self Care | Payer: Medicare Other | Source: Ambulatory Visit | Attending: Internal Medicine | Admitting: Internal Medicine

## 2010-08-01 ENCOUNTER — Other Ambulatory Visit (HOSPITAL_COMMUNITY): Payer: Self-pay | Admitting: Internal Medicine

## 2010-08-01 DIAGNOSIS — M949 Disorder of cartilage, unspecified: Secondary | ICD-10-CM | POA: Insufficient documentation

## 2010-08-01 DIAGNOSIS — Z1231 Encounter for screening mammogram for malignant neoplasm of breast: Secondary | ICD-10-CM | POA: Insufficient documentation

## 2010-08-01 DIAGNOSIS — Z139 Encounter for screening, unspecified: Secondary | ICD-10-CM

## 2010-08-01 DIAGNOSIS — Z853 Personal history of malignant neoplasm of breast: Secondary | ICD-10-CM | POA: Insufficient documentation

## 2010-08-01 DIAGNOSIS — Z780 Asymptomatic menopausal state: Secondary | ICD-10-CM | POA: Insufficient documentation

## 2010-08-01 DIAGNOSIS — M899 Disorder of bone, unspecified: Secondary | ICD-10-CM | POA: Insufficient documentation

## 2010-08-22 ENCOUNTER — Ambulatory Visit (INDEPENDENT_AMBULATORY_CARE_PROVIDER_SITE_OTHER): Payer: Medicare Other | Admitting: *Deleted

## 2010-08-22 DIAGNOSIS — Z7901 Long term (current) use of anticoagulants: Secondary | ICD-10-CM

## 2010-08-22 DIAGNOSIS — I4891 Unspecified atrial fibrillation: Secondary | ICD-10-CM

## 2010-08-22 MED ORDER — WARFARIN SODIUM 2.5 MG PO TABS: 2.5000 mg | ORAL_TABLET | Freq: Every day | ORAL | Status: DC

## 2010-09-08 ENCOUNTER — Ambulatory Visit: Payer: Medicare Other | Admitting: Urgent Care

## 2010-09-15 ENCOUNTER — Ambulatory Visit (INDEPENDENT_AMBULATORY_CARE_PROVIDER_SITE_OTHER): Payer: Medicare Other | Admitting: Gastroenterology

## 2010-09-15 ENCOUNTER — Ambulatory Visit (HOSPITAL_COMMUNITY)
Admission: RE | Admit: 2010-09-15 | Discharge: 2010-09-15 | Disposition: A | Discharge status: Home / Self Care | Payer: Medicare Other | Source: Ambulatory Visit | Attending: Gastroenterology | Admitting: Gastroenterology

## 2010-09-15 ENCOUNTER — Encounter: Payer: Self-pay | Admitting: Gastroenterology

## 2010-09-15 DIAGNOSIS — K6389 Other specified diseases of intestine: Secondary | ICD-10-CM | POA: Insufficient documentation

## 2010-09-15 DIAGNOSIS — N390 Urinary tract infection, site not specified: Secondary | ICD-10-CM

## 2010-09-15 DIAGNOSIS — R110 Nausea: Secondary | ICD-10-CM | POA: Insufficient documentation

## 2010-09-15 DIAGNOSIS — K5900 Constipation, unspecified: Secondary | ICD-10-CM

## 2010-09-15 LAB — BASIC METABOLIC PANEL
BUN: 28 mg/dL — ABNORMAL HIGH (ref 6–23)
Chloride: 90 mEq/L — ABNORMAL LOW (ref 96–112)
Potassium: 3.3 mEq/L — ABNORMAL LOW (ref 3.5–5.3)
Sodium: 134 mEq/L — ABNORMAL LOW (ref 135–145)

## 2010-09-15 NOTE — Progress Notes (Signed)
Referring Provider: Cassell Smiles., MD Primary Care Physician:  Cassell Smiles., MD Primary Gastroenterologist: Dr. Jena Gauss   Chief Complaint  Patient presents with  . Colonoscopy  . Abdominal Pain    HPI:   [name] presents today at the request of Dr. Sherwood Gambler for a possible updated colonoscopy. Last done in April 2010, with findings of friable anal canal, left-sided diverticula, adenomatous polyps. Due for updated surveillance in April 2013. Hx of microscopic colitis, diagnosed in 2003; was maintained on Asacol. She is no longer taking this. Reports stopping a few months ago, but she is unsure when. Has hx of chronic abdominal pain, IBS.   Reports swollen abdomen, lower abdominal cramping, like menstrual cramps. Reported as intermittent, not relieved by anything. Takes Bentyl BID. Feels bloated. Has started taking Colace, which provides some relief. Unable to take Miralax, as she does not like liquids. Reports hard stools. No brbpr or melena. Denies dysphagia or epigastric pain. On PPI.   Reports urinary burning, foul odor. Given macrodantin by PCP, but she was unable to take this due to nausea. Requesting abx.   Past Medical History  Diagnosis Date  . Rheumatic heart disease   . IBS (irritable bowel syndrome)   . GERD (gastroesophageal reflux disease)   . Breast cancer   . History of mitral valve insufficency   . Mitral stenosis   . Atrial fibrillation   . PAT (paroxysmal atrial tachycardia)   . Thyroid disease   . Melanoma of neck   . Microscopic colitis 2003    was maintained on Asacol BID  . S/P colonoscopy April 2010    friable anal canal, left-sided diverticula, adenomatous polyp  . S/P endoscopy April 2010    benign polyp, s/p 54-F Maloney dilation    Past Surgical History  Procedure Date  . Cardiac catheterization     NORMAL CORONARY ARTERIES AT CATH IN 2006  . Abdominal hysterectomy   . Mastectomy     RIGHT BREAST, followed by left breast after radiation    . Melanoma removal     SURGERY NECK  . Cataracts   . Hemorrhoid surgery   . Tonsillectomy     Current Outpatient Prescriptions  Medication Sig Dispense Refill  . Calcium Carbonate-Vit D-Min (CALTRATE 600+D PLUS PO) Take 400 mg by mouth.        . chlorthalidone (HYGROTON) 25 MG tablet Take 25 mg by mouth daily.        Marland Kitchen dicyclomine (BENTYL) 10 MG capsule Take 10 mg by mouth 4 (four) times daily.        Marland Kitchen diltiazem (CARDIZEM CD) 240 MG 24 hr capsule Take 240 mg by mouth daily.        . furosemide (LASIX) 20 MG tablet Take 20 mg by mouth as needed.        Marland Kitchen HYDROcodone-acetaminophen (LORTAB) 10-500 MG per tablet Take 1 tablet by mouth every 6 (six) hours as needed.        . hydrocortisone (CORTEF) 10 MG tablet Take 10 mg by mouth daily.        Marland Kitchen levothyroxine (SYNTHROID, LEVOTHROID) 88 MCG tablet Take 88 mcg by mouth daily.        Marland Kitchen LORazepam (ATIVAN) 0.5 MG tablet Take 0.5 mg by mouth as needed.        . pantoprazole (PROTONIX) 40 MG tablet Take 40 mg by mouth 2 (two) times daily.        . potassium chloride (KLOR-CON) 10 MEQ CR tablet  Take 10 mEq by mouth daily.        . propranolol (INDERAL) 80 MG tablet Take 80 mg by mouth daily.        Marland Kitchen warfarin (COUMADIN) 2.5 MG tablet Take 1 tablet (2.5 mg total) by mouth daily. AS DIRECTED   60 tablet  3  . desoximetasone (TOPICORT) 0.25 % cream Apply topically 2 (two) times daily.        . diclofenac (VOLTAREN) 0.1 % ophthalmic solution 1 drop 2 (two) times daily.        . fish oil-omega-3 fatty acids 1000 MG capsule Take 2 g by mouth daily.        . vitamin B-12 (CYANOCOBALAMIN) 100 MCG tablet Take 50 mcg by mouth daily.          Allergies as of 09/15/2010 - Review Complete 09/15/2010  Allergen Reaction Noted  . Sulfonamide derivatives  10/23/2007    Family History  Problem Relation Age of Onset  . Stroke Mother   . Heart attack Father   . Breast cancer Sister   . Colon cancer Neg Hx     History   Social History  . Marital  Status: Single    Spouse Name: N/A    Number of Children: N/A  . Years of Education: N/A   Occupational History  . RETIRED    Social History Main Topics  . Smoking status: Never Smoker   . Smokeless tobacco: Never Used  . Alcohol Use: No  . Drug Use: No  . Sexually Active: None     Review of Systems: Gen: Denies fever, chills, anorexia. Denies fatigue, weakness, weight loss.  CV: Denies chest pain, palpitations, syncope, peripheral edema, and claudication. Resp: Denies dyspnea at rest, cough, wheezing, coughing up blood, and pleurisy. GI: Denies vomiting blood, jaundice, and fecal incontinence.   Denies dysphagia or odynophagia. GU: complains of urinary frequency, burning Derm: Denies rash, itching, dry skin Psych: Denies depression, anxiety, memory loss, confusion. No homicidal or suicidal ideation.  Heme: Denies bruising, bleeding, and enlarged lymph nodes.  Physical Exam: BP 132/87  Pulse 71  Temp(Src) 98.4 F (36.9 C) (Temporal)  Ht 5\' 3"  (1.6 m)  Wt 139 lb (63.05 kg)  BMI 24.62 kg/m2 General:   Alert and oriented. No distress noted. Pleasant and cooperative.  Head:  Normocephalic and atraumatic. Eyes:  Conjuctiva clear without scleral icterus. Mouth:  Oral mucosa pink and moist. Good dentition. No lesions. Neck:  Supple, without mass or thyromegaly. Heart:  S1, S2 present, irregular rhythm consistent with hx of afib noted Abdomen:  +BS, soft, non-tender, rounded, slightly distended. No rebound or guarding. No HSM or masses noted. Msk:  Symmetrical without gross deformities. Normal posture. Extremities:  Without edema. Neurologic:  Alert and  oriented x4;  grossly normal neurologically. Skin:  Intact without significant lesions or rashes. Cervical Nodes:  No significant cervical adenopathy. Psych:  Alert and cooperative. Normal mood and affect.

## 2010-09-15 NOTE — Patient Instructions (Addendum)
Complete xray and labs. We will call you with results.  STOP BENTYL for now. Only take if diarrhea resumes.   Start eating a high fiber diet. Drink at least 6-8 glasses of water per day.   We have given you a stool sample kit to complete. We would like this completed after you have returned to normal bowel functioning (about 1-2 weeks).  We will be contacting you with the results.   Start taking a probiotic daily. This will help with your digestive health.

## 2010-09-16 LAB — URINALYSIS
Bilirubin Urine: NEGATIVE
Glucose, UA: NEGATIVE mg/dL
Ketones, ur: NEGATIVE mg/dL
Specific Gravity, Urine: 1.026 (ref 1.005–1.030)
pH: 7 (ref 5.0–8.0)

## 2010-09-17 ENCOUNTER — Encounter: Payer: Self-pay | Admitting: Gastroenterology

## 2010-09-17 DIAGNOSIS — K5900 Constipation, unspecified: Secondary | ICD-10-CM | POA: Insufficient documentation

## 2010-09-17 DIAGNOSIS — N390 Urinary tract infection, site not specified: Secondary | ICD-10-CM | POA: Insufficient documentation

## 2010-09-17 NOTE — Assessment & Plan Note (Signed)
Symptoms of UTI, will check UA, C&S. Pt given macrodantin by PCP and has not tolerated well. Reports using Cipro in past. Will f/u on labs.

## 2010-09-17 NOTE — Assessment & Plan Note (Addendum)
77 year old Caucasian female with hx of microscopic colitis diagnosed in 2003, previously maintained on Asacol BID, now with significant constipation and continuing to take Bentyl BID. No longer on Asacol, and she is unsure when she stopped this. Hx of adenomatous polys, with most recent colonoscopy in April 2010. Due for updated surveillance in April 2013. No melena or brbpr. Complaining of bloating and lower abdominal cramping, not relieved by anything. Hx of chronic abdominal pain. Likely bloating, lower abdominal discomfort due to constipation, compounded by Bentyl, which may be exacerbating these symptoms. We will stop Bentyl for now.  Stop Bentyl High fiber diet ifobt in a few weeks once constipation resolved AAS due to complaints of significant bloating Consider colonoscopy prior to April 2012 if ifobt + or no significant improvement in symptoms F/U in 3 months

## 2010-09-19 ENCOUNTER — Encounter: Payer: Medicare Other | Admitting: *Deleted

## 2010-09-19 LAB — CULTURE, URINE COMPREHENSIVE: Colony Count: >=100000

## 2010-09-19 NOTE — Progress Notes (Signed)
Cc to PCP 

## 2010-09-20 ENCOUNTER — Other Ambulatory Visit: Payer: Self-pay | Admitting: Gastroenterology

## 2010-09-20 DIAGNOSIS — N390 Urinary tract infection, site not specified: Secondary | ICD-10-CM

## 2010-09-20 MED ORDER — REZYST IM PO CHEW: 1.0000 | CHEWABLE_TABLET | Freq: Every day | ORAL | Status: DC

## 2010-09-20 MED ORDER — AMPICILLIN 250 MG PO CAPS: 250.0000 mg | ORAL_CAPSULE | Freq: Four times a day (QID) | ORAL | Status: AC

## 2010-09-20 NOTE — Progress Notes (Signed)
Originally was going to order Cipro. High interaction with Coumadin. Will proceed with ampicillin instead. Pt needs to f/u with PCP if no improvement. X 7days. Sent to pharmacy.

## 2010-10-25 ENCOUNTER — Telehealth: Payer: Self-pay

## 2010-10-25 NOTE — Telephone Encounter (Signed)
Pt called- she stated the antibiotics have almost cleared up her UTI, but she is still having some symptoms and wants to know if she can have another refill. She uses [facility]. She has an appointment with Dr. Sherwood Gambler in 2 weeks. She said that was the earliest appt she could get with him. Please advise.

## 2010-10-25 NOTE — Telephone Encounter (Signed)
She really needs to get with her PCP about this. She may need further evaluation, which we can't give her. If she is having GI symptoms, she would need to be seen by [place]. If it is urinary, needs to see PCP.

## 2010-10-26 ENCOUNTER — Ambulatory Visit (INDEPENDENT_AMBULATORY_CARE_PROVIDER_SITE_OTHER): Payer: Medicare Other | Admitting: *Deleted

## 2010-10-26 DIAGNOSIS — Z7901 Long term (current) use of anticoagulants: Secondary | ICD-10-CM

## 2010-10-26 DIAGNOSIS — I4891 Unspecified atrial fibrillation: Secondary | ICD-10-CM

## 2010-10-26 LAB — POCT INR: INR: 4.4

## 2010-10-26 MED ORDER — WARFARIN SODIUM 2.5 MG PO TABS: ORAL_TABLET | ORAL | Status: DC

## 2010-10-26 NOTE — Telephone Encounter (Signed)
Pt aware.

## 2010-11-16 ENCOUNTER — Ambulatory Visit (INDEPENDENT_AMBULATORY_CARE_PROVIDER_SITE_OTHER): Payer: Medicare Other | Admitting: *Deleted

## 2010-11-16 DIAGNOSIS — Z7901 Long term (current) use of anticoagulants: Secondary | ICD-10-CM

## 2010-11-16 DIAGNOSIS — I4891 Unspecified atrial fibrillation: Secondary | ICD-10-CM

## 2010-11-16 LAB — POCT INR: INR: 2.2

## 2010-12-01 ENCOUNTER — Encounter: Payer: Self-pay | Admitting: Cardiology

## 2010-12-01 ENCOUNTER — Ambulatory Visit (INDEPENDENT_AMBULATORY_CARE_PROVIDER_SITE_OTHER): Payer: Medicare Other | Admitting: Cardiology

## 2010-12-01 VITALS — BP 130/74 | HR 67 | Resp 18 | Ht 63.0 in | Wt 144.0 lb

## 2010-12-01 DIAGNOSIS — I050 Rheumatic mitral stenosis: Secondary | ICD-10-CM

## 2010-12-01 DIAGNOSIS — I4891 Unspecified atrial fibrillation: Secondary | ICD-10-CM

## 2010-12-01 NOTE — Progress Notes (Signed)
Clinical Summary Ms. Gauger is a 77 y.o.female presenting for followup. She was seen by Ms. Lawrence back in April.  She reports no palpitations, progressive dyspnea, or chest pain. Heart rate seems adequately controlled. Her last echocardiogram is noted below.  Followup ECG reviewed. She continues on medical therapy for atrial fibrillation, no bleeding problems on Coumadin.   Allergies  Allergen Reactions  . Sulfonamide Derivatives     Medication list reviewed.  Past Medical History  Diagnosis Date  . Rheumatic heart disease   . IBS (irritable bowel syndrome)   . GERD (gastroesophageal reflux disease)   . Breast cancer   . Mitral regurgitation   . Mitral stenosis   . Atrial fibrillation   . PAT (paroxysmal atrial tachycardia)   . Hypothyroidism   . Melanoma of neck   . Microscopic colitis 2003    Was maintained on Asacol BID  . S/P colonoscopy April 2010    Friable anal canal, left-sided diverticula, adenomatous polyp  . S/P endoscopy April 2010    Benign polyp, s/p 54-F Maloney dilation    Past Surgical History  Procedure Date  . Cardiac catheterization     NORMAL CORONARY ARTERIES AT CATH IN 2006  . Abdominal hysterectomy   . Mastectomy     RIGHT BREAST, followed by left breast after radiation  . Melanoma removal     SURGERY NECK  . Cataracts   . Hemorrhoid surgery   . Tonsillectomy     Family History  Problem Relation Age of Onset  . Stroke Mother   . Heart attack Father   . Breast cancer Sister   . Colon cancer Neg Hx     Social History Ms. Hesser reports that she has never smoked. She has never used smokeless tobacco. Ms. Ruggirello reports that she does not drink alcohol.  Review of Systems Reports some recent "bladder problems," anxious about her health problems. Otherwise as outlined above.  Physical Examination Filed Vitals:   12/01/10 0821  BP: 130/74  Pulse: 67  Resp: 18    Thin elderly woman in no acute distress.  HEENT: Conjunctiva  and lids are normal, oropharynx was most moist mucosa.  Neck: Supple, no elevated jugular venous pressure, no carotid bruits. No thyromegaly.  Lungs: Diminished breath sounds, otherwise clear, nonlabored.  Cardiac: irregularly irregular, 2/6 apical systolic murmur, no S3 gallop, no pericardial rub.  Abdomen: Soft, nontender, bowel sounds present.  Extremities: No significant pitting edema, distal pulses one plus.  Neuropsychiatric: Alert oriented x3, affect appropriate.  Skin: Warm and dry.  Musculoskeletal: Kyphosis noted.   ECG Atrial fibrillation with nonspecific ST/T changes.  Studies Echocardiogram 10/23/2009: - Left ventricle: The cavity size was normal. Wall thickness was normal. Systolic function was normal. The estimated ejection fraction was in the range of 55% to 60%. Wall motion was normal; there were no regional wall motion abnormalities. - Aortic valve: Mildly calcified annulus. Trileaflet; mildly thickened, mildly calcified leaflets. Mild regurgitation. - Mitral valve: Mobility of the anterior leaflet was mildly restricted and had a slight curvalinear configuration suggesting Rheumatic disease. Decreased mobility of the posterior leaflet also noted. Mild regurgitation. - Left atrium: The atrium was moderately dilated. - Right atrium: The atrium was mildly dilated. Impressions:  - Compared to the prior study of 11/20/08, there has been no significant interval change. Compared to the prior study of 11/28/07, magnitude or MR has decreased and LA size has decreased.     Problem List and Plan

## 2010-12-01 NOTE — Assessment & Plan Note (Signed)
Mild rheumatic mitral stenosis and regurgitation. Follow clinically for now.

## 2010-12-01 NOTE — Patient Instructions (Signed)
Your physician recommends that you continue on your current medications as directed. Please refer to the Current Medication list given to you today.  Your physician recommends that you schedule a follow-up appointment in: 6 months  

## 2010-12-01 NOTE — Assessment & Plan Note (Signed)
Persistent, symptomatically stable on medical therapy. Continue strategy of rate control and anticoagulation.

## 2010-12-13 ENCOUNTER — Ambulatory Visit (HOSPITAL_COMMUNITY)
Admission: RE | Admit: 2010-12-13 | Discharge: 2010-12-13 | Disposition: A | Discharge status: Home / Self Care | Payer: Medicare Other | Source: Ambulatory Visit | Attending: Internal Medicine | Admitting: Internal Medicine

## 2010-12-13 ENCOUNTER — Other Ambulatory Visit (HOSPITAL_COMMUNITY): Payer: Self-pay | Admitting: Internal Medicine

## 2010-12-13 DIAGNOSIS — W19XXXA Unspecified fall, initial encounter: Secondary | ICD-10-CM

## 2010-12-13 DIAGNOSIS — S0990XA Unspecified injury of head, initial encounter: Secondary | ICD-10-CM | POA: Insufficient documentation

## 2010-12-13 DIAGNOSIS — R220 Localized swelling, mass and lump, head: Secondary | ICD-10-CM | POA: Insufficient documentation

## 2010-12-13 DIAGNOSIS — W1809XA Striking against other object with subsequent fall, initial encounter: Secondary | ICD-10-CM | POA: Insufficient documentation

## 2010-12-14 ENCOUNTER — Encounter: Payer: Medicare Other | Admitting: *Deleted

## 2010-12-21 ENCOUNTER — Encounter: Payer: Medicare Other | Admitting: *Deleted

## 2011-01-05 ENCOUNTER — Encounter: Payer: Medicare Other | Admitting: *Deleted

## 2011-01-10 ENCOUNTER — Telehealth: Payer: Self-pay | Admitting: Cardiology

## 2011-01-10 NOTE — Telephone Encounter (Signed)
Spoke with pt.  States she fell and has been on antibiotics x 1 month.  Saw Dr Sherwood Gambler and he sent her for xrays.  Pt will keep appt for INR check on 01/23/11.  Does not think she can come sooner.  Stressed importance of regular coumadin checks esp. When taking an Abx.  She verbalized understanding.

## 2011-01-10 NOTE — Telephone Encounter (Signed)
Received call from patient.  She has been scheduled for coumadin 01-23-11, she is still taking antibiotic and wants to see if she needs to keep this appointment.  Please call patient and determine when she needs to be seen.  Patient has some other medications in antibiotic she thinks also would be hard to get a reading.  She has not had coumadin checked since September.

## 2011-01-16 ENCOUNTER — Encounter: Payer: Medicare Other | Admitting: *Deleted

## 2011-01-17 ENCOUNTER — Other Ambulatory Visit (HOSPITAL_COMMUNITY): Payer: Self-pay | Admitting: Internal Medicine

## 2011-01-17 DIAGNOSIS — J309 Allergic rhinitis, unspecified: Secondary | ICD-10-CM

## 2011-01-17 DIAGNOSIS — R51 Headache: Secondary | ICD-10-CM

## 2011-01-19 ENCOUNTER — Other Ambulatory Visit (HOSPITAL_COMMUNITY): Payer: Medicare Other

## 2011-01-20 ENCOUNTER — Ambulatory Visit (HOSPITAL_COMMUNITY)
Admission: RE | Admit: 2011-01-20 | Discharge: 2011-01-20 | Disposition: A | Discharge status: Home / Self Care | Payer: Medicare Other | Source: Ambulatory Visit | Attending: Internal Medicine | Admitting: Internal Medicine

## 2011-01-20 DIAGNOSIS — Z853 Personal history of malignant neoplasm of breast: Secondary | ICD-10-CM | POA: Insufficient documentation

## 2011-01-20 DIAGNOSIS — R51 Headache: Secondary | ICD-10-CM

## 2011-01-20 DIAGNOSIS — J309 Allergic rhinitis, unspecified: Secondary | ICD-10-CM | POA: Insufficient documentation

## 2011-01-23 ENCOUNTER — Ambulatory Visit (INDEPENDENT_AMBULATORY_CARE_PROVIDER_SITE_OTHER): Payer: Medicare Other | Admitting: *Deleted

## 2011-01-23 DIAGNOSIS — Z7901 Long term (current) use of anticoagulants: Secondary | ICD-10-CM

## 2011-01-23 DIAGNOSIS — I4891 Unspecified atrial fibrillation: Secondary | ICD-10-CM

## 2011-01-23 LAB — POCT INR: INR: 3.1

## 2011-02-17 ENCOUNTER — Other Ambulatory Visit: Payer: Self-pay | Admitting: Cardiology

## 2011-02-22 ENCOUNTER — Encounter: Payer: Medicare Other | Admitting: *Deleted

## 2011-04-06 ENCOUNTER — Ambulatory Visit (INDEPENDENT_AMBULATORY_CARE_PROVIDER_SITE_OTHER): Payer: Medicare Other | Admitting: *Deleted

## 2011-04-06 DIAGNOSIS — I4891 Unspecified atrial fibrillation: Secondary | ICD-10-CM

## 2011-04-06 DIAGNOSIS — Z7901 Long term (current) use of anticoagulants: Secondary | ICD-10-CM

## 2011-06-07 ENCOUNTER — Ambulatory Visit (INDEPENDENT_AMBULATORY_CARE_PROVIDER_SITE_OTHER): Payer: Medicare Other | Admitting: Orthopedic Surgery

## 2011-06-07 ENCOUNTER — Encounter: Payer: Self-pay | Admitting: Orthopedic Surgery

## 2011-06-07 VITALS — BP 110/78 | Ht 63.0 in | Wt 145.0 lb

## 2011-06-07 DIAGNOSIS — M79673 Pain in unspecified foot: Secondary | ICD-10-CM

## 2011-06-07 DIAGNOSIS — Z4789 Encounter for other orthopedic aftercare: Secondary | ICD-10-CM

## 2011-06-07 DIAGNOSIS — M79609 Pain in unspecified limb: Secondary | ICD-10-CM

## 2011-06-07 DIAGNOSIS — G8929 Other chronic pain: Secondary | ICD-10-CM | POA: Insufficient documentation

## 2011-06-07 DIAGNOSIS — M84376G Stress fracture, unspecified foot, subsequent encounter for fracture with delayed healing: Secondary | ICD-10-CM

## 2011-06-07 NOTE — Progress Notes (Signed)
  Subjective:    [name] is a 78 y.o. female who presents with pain in her RIGHT foot. Injured in 2009. Our notes indicate she was treated with a postop shoe, but in any event. She continues to have pain on the lateral border of the RIGHT foot, near the 5th metatarsal. She's been followed by podiatry and looks, like she's had injections. There. She another opinion regarding the foot.  She continues to complain of 8/10. Constant pain and swelling at the area of the 5th metatarsal in its proximal aspect.  Her pain is described as throbbing constant and 8/10. Pertinent review of systems include muscle pain others. Review of systems include shortness of breath, chest pain, fainting, chills, and fatigue. Frequency easy bleeding and bruising anxiety, and cold intolerance.   The following portions of the patient's history were reviewed and updated as appropriate: allergies, current medications, past family history, past medical history, past social history, past surgical history and problem list.  Review of Systems Pertinent items are noted in HPI.    Objective:    BP 110/78  Ht 5\' 3"  (1.6 m)  Wt 145 lb (65.772 kg)  BMI 25.69 kg/m2  Vital signs are stable as recorded  General appearance is normal  The patient is alert and oriented x3  The patient's mood and affect are normal  Gait assessment: limp The cardiovascular exam reveals normal pulses and temperature without edema swelling.  The lymphatic system is negative for palpable lymph nodes  The sensory exam is normal.  There are no pathologic reflexes.  Balance is normal.   Exam of the right foot reveals a bony foot with several varicosities and prominence of the 5th metatarsal with tenderness. No swelling. Skin is intact. Ankle joint is stable. There is no major muscle atrophy. The ankle joint itself is stable.  X-rays were evaluated, including x-rays from 2010 in January and February of 2013 and they show a fracture line at  the proximal aspect of the 5th metatarsal, which looks to be healed. Clinically, she has stress fracture there.   Assessment:    Suspected metatarsal stress fracture    Plan:    HARD SOLE SHOE   X 6 WEEKS   F/U WITH PODIATRY AND PMD

## 2011-06-07 NOTE — Patient Instructions (Addendum)
Wear hard sole shoe x 6 weeks   F/U with primary care doctor as needed

## 2011-06-15 ENCOUNTER — Other Ambulatory Visit: Payer: Self-pay | Admitting: *Deleted

## 2011-06-15 ENCOUNTER — Ambulatory Visit (INDEPENDENT_AMBULATORY_CARE_PROVIDER_SITE_OTHER): Payer: Medicare Other | Admitting: *Deleted

## 2011-06-15 ENCOUNTER — Ambulatory Visit (INDEPENDENT_AMBULATORY_CARE_PROVIDER_SITE_OTHER): Payer: Medicare Other | Admitting: Cardiology

## 2011-06-15 ENCOUNTER — Encounter: Payer: Self-pay | Admitting: Cardiology

## 2011-06-15 VITALS — BP 153/85 | HR 90 | Ht 63.0 in | Wt 149.0 lb

## 2011-06-15 DIAGNOSIS — R609 Edema, unspecified: Secondary | ICD-10-CM

## 2011-06-15 DIAGNOSIS — I4891 Unspecified atrial fibrillation: Secondary | ICD-10-CM

## 2011-06-15 DIAGNOSIS — R0602 Shortness of breath: Secondary | ICD-10-CM

## 2011-06-15 DIAGNOSIS — I059 Rheumatic mitral valve disease, unspecified: Secondary | ICD-10-CM

## 2011-06-15 DIAGNOSIS — Z7901 Long term (current) use of anticoagulants: Secondary | ICD-10-CM

## 2011-06-15 LAB — POCT INR: INR: 2.8

## 2011-06-15 MED ORDER — DILTIAZEM HCL ER COATED BEADS 300 MG PO CP24: 300.0000 mg | ORAL_CAPSULE | Freq: Every day | ORAL | Status: DC

## 2011-06-15 MED ORDER — WARFARIN SODIUM 2.5 MG PO TABS: 2.5000 mg | ORAL_TABLET | ORAL | Status: DC

## 2011-06-15 NOTE — Patient Instructions (Signed)
**Note De-Identified [name] Obfuscation** Your physician has recommended you make the following change in your medication: increase Diltiazem to 300 mg daily   REMINDER: Take Lasix more regular for leg swelling  Your physician has requested that you have an echocardiogram. Echocardiography is a painless test that uses sound waves to create images of your heart. It provides your doctor with information about the size and shape of your heart and how well your heart's chambers and valves are working. This procedure takes approximately one hour. There are no restrictions for this procedure.  Your physician recommends that you schedule a follow-up appointment in: 6 months

## 2011-06-15 NOTE — Assessment & Plan Note (Signed)
Reminded her about PRN use of Lasix which she has at home already.

## 2011-06-15 NOTE — Progress Notes (Signed)
Clinical Summary Ms. Bischof is a 78 y.o.female presenting for followup. She was seen in October 2012. She reports problems with anxiety - requested refills on Ativan (prescribed by Dr. Sherwood Gambler). Also recent increase in shortness of breath. No chest pain. Some sense of increased palpitations also noted.  No bleeding reported on Coumadin. No falls.  Last echocardiogram report reviewed from 2011.  Has noticed more leg edema, although states that she has not been using her Lasix.  Allergies  Allergen Reactions  . Ciprofloxacin   . Sulfonamide Derivatives     Current Outpatient Prescriptions  Medication Sig Dispense Refill  . amoxicillin (AMOXIL) 250 MG capsule Take 250 mg by mouth 2 (two) times daily.      . Calcium Carbonate-Vit D-Min (CALTRATE 600+D PLUS PO) Take 400 mg by mouth.        . chlorthalidone (HYGROTON) 25 MG tablet Take 25 mg by mouth daily.        Marland Kitchen COUMADIN 2.5 MG tablet TAKE 1 TABLET BY MOUTH DAILY EXCEPT 1/2 TABLET ON MONDAY,WEDNESDAY,& FRIDAY.  60 each  3  . diclofenac sodium (VOLTAREN) 1 % GEL Apply 1 application topically 3 (three) times daily.      Marland Kitchen dicyclomine (BENTYL) 10 MG capsule Take 10 mg by mouth 4 (four) times daily.        Marland Kitchen diltiazem (CARDIZEM CD) 300 MG 24 hr capsule Take 1 capsule (300 mg total) by mouth daily.  30 capsule  6  . furosemide (LASIX) 20 MG tablet Take 20 mg by mouth as needed.        Marland Kitchen HYDROcodone-acetaminophen (LORTAB) 10-500 MG per tablet Take 1 tablet by mouth every 6 (six) hours as needed.        . hydrocortisone (CORTEF) 10 MG tablet Take 10 mg by mouth daily.        Marland Kitchen levothyroxine (SYNTHROID, LEVOTHROID) 88 MCG tablet Take 88 mcg by mouth daily.        Marland Kitchen LORazepam (ATIVAN) 0.5 MG tablet Take 0.5 mg by mouth as needed.        . pantoprazole (PROTONIX) 40 MG tablet Take 40 mg by mouth 2 (two) times daily.        . potassium chloride (KLOR-CON) 10 MEQ CR tablet Take 10 mEq by mouth daily.        . Probiotic Product (RESTORA) CAPS Take  by mouth daily.      . propranolol (INDERAL) 80 MG tablet Take 80 mg by mouth daily.        Marland Kitchen DISCONTD: diltiazem (CARDIZEM CD) 240 MG 24 hr capsule Take 240 mg by mouth daily.          Past Medical History  Diagnosis Date  . Rheumatic heart disease   . IBS (irritable bowel syndrome)   . GERD (gastroesophageal reflux disease)   . Breast cancer   . Mitral regurgitation   . Mitral stenosis   . Atrial fibrillation   . PAT (paroxysmal atrial tachycardia)   . Hypothyroidism   . Melanoma of neck   . Microscopic colitis 2003    Was maintained on Asacol BID  . S/P colonoscopy April 2010    Friable anal canal, left-sided diverticula, adenomatous polyp  . S/P endoscopy April 2010    Benign polyp, s/p 54-F Maloney dilation    Social History Ms. Johanning reports that she has never smoked. She has never used smokeless tobacco. Ms. Welshans reports that she does not drink alcohol.  Review of Systems Negative  except as outlined above.  Physical Examination Filed Vitals:   06/15/11 0837  BP: 153/85  Pulse: 90   Elderly woman in no acute distress.  HEENT: Conjunctiva and lids are normal, oropharynx clear. Neck: Supple, no elevated jugular venous pressure, no carotid bruits. No thyromegaly.  Lungs: Diminished breath sounds, no rales, nonlabored.  Cardiac: Irregularly irregular, 2/6 apical systolic murmur, no S3 gallop, no pericardial rub.  Abdomen: Soft, nontender, bowel sounds present.  Extremities: 1-2+ edema left greater than right, distal pulses one plus.  Neuropsychiatric: Alert oriented x3, affect appropriate.  Skin: Warm and dry.  Musculoskeletal: Kyphosis noted.    Problem List and Plan

## 2011-06-15 NOTE — Assessment & Plan Note (Signed)
-   Plan as noted above

## 2011-06-15 NOTE — Assessment & Plan Note (Signed)
Last evaluation showed mild stenosis and regurgitation. Will followup echocardiogram to reassess in light of increased dyspnea.

## 2011-06-15 NOTE — Assessment & Plan Note (Signed)
Plan to increase Cardizem CD to 300 mg daily to see if better heart rate control will lead to less dyspnea. Continue Coumadin with followup in the Coumadin clinic.

## 2011-06-23 ENCOUNTER — Ambulatory Visit (HOSPITAL_COMMUNITY)
Admission: RE | Admit: 2011-06-23 | Discharge: 2011-06-23 | Disposition: A | Discharge status: Home / Self Care | Payer: Medicare Other | Source: Ambulatory Visit | Attending: Cardiology | Admitting: Cardiology

## 2011-06-23 DIAGNOSIS — R0602 Shortness of breath: Secondary | ICD-10-CM

## 2011-06-23 DIAGNOSIS — I359 Nonrheumatic aortic valve disorder, unspecified: Secondary | ICD-10-CM

## 2011-06-23 DIAGNOSIS — I4891 Unspecified atrial fibrillation: Secondary | ICD-10-CM | POA: Insufficient documentation

## 2011-06-23 DIAGNOSIS — I059 Rheumatic mitral valve disease, unspecified: Secondary | ICD-10-CM | POA: Insufficient documentation

## 2011-06-23 NOTE — Progress Notes (Signed)
*  PRELIMINARY RESULTS* Echocardiogram 2D Echocardiogram has been performed.  [place] 06/23/2011, 8:21 AM

## 2011-07-04 ENCOUNTER — Telehealth: Payer: Self-pay | Admitting: Cardiology

## 2011-07-04 NOTE — Telephone Encounter (Signed)
**Note De-identified [name] Obfuscation** Pt. advised, she verbalized understanding./LV 

## 2011-07-04 NOTE — Telephone Encounter (Signed)
Patient would like to speak with nurse regarding medications. / tg  °

## 2011-07-07 ENCOUNTER — Telehealth: Payer: Self-pay

## 2011-07-07 NOTE — Telephone Encounter (Signed)
**Note De-identified Amahri Dengel Obfuscation** Pt. Advised, she verbalized understanding./LV 

## 2011-07-07 NOTE — Telephone Encounter (Signed)
Reviewed. I would not cut back the Cardizem as I suspect that she is breathing better because her heart rate is better controlled. She mentioned edema at her last OV, although had not been taking her Lasix regularly. Would suggest following weights, using Lasix more regularly.

## 2011-07-07 NOTE — Telephone Encounter (Signed)
**Note De-Identified [name] Obfuscation** Pt. had a OV with Dr. Sherwood Gambler early this week and was advised to contact us concerning weight gain and edema. I called [facility] and spoke with Dr. Sherwood Gambler who said that pt. was advised to increase Lasix 20 mg to BID and to call our office concerning recent increase in Diltiazem to 300 mg daily at last OV with Dr. Diona Browner on 4-25.  He states that the increase in Diltiazem may be contributing to her weight gain and edema. Pt. States that she has been breathing so much better since increase in Diltiazem that she wants to continue to take at same dose. Please advise./LV

## 2011-07-11 ENCOUNTER — Telehealth: Payer: Self-pay | Admitting: Orthopedic Surgery

## 2011-07-11 NOTE — Telephone Encounter (Signed)
Call received from patient, requests to postpone fol/up appointment for 07/19/11, due to illness.  States treating with cardiologist and with primary care physician.  Offered re-schedule; she wishes to wait and call back.

## 2011-07-12 ENCOUNTER — Ambulatory Visit (HOSPITAL_COMMUNITY): Payer: Medicare Other | Admitting: Oncology

## 2011-07-19 ENCOUNTER — Ambulatory Visit: Payer: Medicare Other | Admitting: Orthopedic Surgery

## 2011-07-26 ENCOUNTER — Ambulatory Visit: Payer: Medicare Other | Admitting: Orthopedic Surgery

## 2011-07-26 ENCOUNTER — Encounter: Payer: Self-pay | Admitting: Orthopedic Surgery

## 2011-07-26 ENCOUNTER — Ambulatory Visit (INDEPENDENT_AMBULATORY_CARE_PROVIDER_SITE_OTHER): Payer: Medicare Other | Admitting: Orthopedic Surgery

## 2011-07-26 VITALS — BP 102/66 | Ht 63.0 in | Wt 150.0 lb

## 2011-07-26 DIAGNOSIS — M84376G Stress fracture, unspecified foot, subsequent encounter for fracture with delayed healing: Secondary | ICD-10-CM

## 2011-07-26 DIAGNOSIS — Z4789 Encounter for other orthopedic aftercare: Secondary | ICD-10-CM

## 2011-07-26 NOTE — Progress Notes (Signed)
Patient ID: [name], female   DOB: September 01, 1929, 78 y.o.   MRN: [id number] Chief Complaint  Patient presents with  . Follow-up    6 week recheck Right foot    BP 102/66  Ht 5\' 3"  (1.6 m)  Wt 150 lb (68.04 kg)  BMI 26.57 kg/m2  Followup status post treatment for possible stress fracture. We used a postop shoe she can wear it but it's been 8 weeks and her symptoms have resolved  Medical exam negative  She is being treated for possible cellulitis/phlebitis right foot and lower leg  She will followup with her primary care physician.

## 2011-07-26 NOTE — Patient Instructions (Signed)
activities as tolerated 

## 2011-08-03 ENCOUNTER — Other Ambulatory Visit (HOSPITAL_COMMUNITY): Payer: Self-pay | Admitting: Family Medicine

## 2011-08-03 ENCOUNTER — Ambulatory Visit (HOSPITAL_COMMUNITY)
Admission: RE | Admit: 2011-08-03 | Discharge: 2011-08-03 | Disposition: A | Discharge status: Home / Self Care | Payer: Medicare Other | Source: Ambulatory Visit | Attending: Family Medicine | Admitting: Family Medicine

## 2011-08-03 DIAGNOSIS — R110 Nausea: Secondary | ICD-10-CM | POA: Insufficient documentation

## 2011-08-03 DIAGNOSIS — R109 Unspecified abdominal pain: Secondary | ICD-10-CM | POA: Insufficient documentation

## 2011-08-15 ENCOUNTER — Other Ambulatory Visit (HOSPITAL_COMMUNITY): Payer: Self-pay | Admitting: Internal Medicine

## 2011-08-15 ENCOUNTER — Ambulatory Visit (HOSPITAL_COMMUNITY)
Admission: RE | Admit: 2011-08-15 | Discharge: 2011-08-15 | Disposition: A | Discharge status: Home / Self Care | Payer: Medicare Other | Source: Ambulatory Visit | Attending: Internal Medicine | Admitting: Internal Medicine

## 2011-08-15 DIAGNOSIS — S80929A Unspecified superficial injury of unspecified lower leg, initial encounter: Secondary | ICD-10-CM | POA: Insufficient documentation

## 2011-08-15 DIAGNOSIS — T07XXXA Unspecified multiple injuries, initial encounter: Secondary | ICD-10-CM | POA: Insufficient documentation

## 2011-08-15 DIAGNOSIS — M899 Disorder of bone, unspecified: Secondary | ICD-10-CM | POA: Insufficient documentation

## 2011-08-15 DIAGNOSIS — M25559 Pain in unspecified hip: Secondary | ICD-10-CM | POA: Insufficient documentation

## 2011-08-15 DIAGNOSIS — S70919A Unspecified superficial injury of unspecified hip, initial encounter: Secondary | ICD-10-CM | POA: Insufficient documentation

## 2011-08-15 DIAGNOSIS — M545 Low back pain, unspecified: Secondary | ICD-10-CM | POA: Insufficient documentation

## 2011-08-15 DIAGNOSIS — M533 Sacrococcygeal disorders, not elsewhere classified: Secondary | ICD-10-CM | POA: Insufficient documentation

## 2011-08-15 DIAGNOSIS — W19XXXA Unspecified fall, initial encounter: Secondary | ICD-10-CM | POA: Insufficient documentation

## 2011-08-15 DIAGNOSIS — M949 Disorder of cartilage, unspecified: Secondary | ICD-10-CM | POA: Insufficient documentation

## 2011-08-15 DIAGNOSIS — Z0000 Encounter for general adult medical examination without abnormal findings: Secondary | ICD-10-CM

## 2011-08-15 DIAGNOSIS — S90919A Unspecified superficial injury of unspecified ankle, initial encounter: Secondary | ICD-10-CM | POA: Insufficient documentation

## 2011-08-15 DIAGNOSIS — Z139 Encounter for screening, unspecified: Secondary | ICD-10-CM

## 2011-08-23 ENCOUNTER — Ambulatory Visit (INDEPENDENT_AMBULATORY_CARE_PROVIDER_SITE_OTHER): Payer: Medicare Other | Admitting: *Deleted

## 2011-08-23 DIAGNOSIS — Z7901 Long term (current) use of anticoagulants: Secondary | ICD-10-CM

## 2011-08-23 DIAGNOSIS — I4891 Unspecified atrial fibrillation: Secondary | ICD-10-CM

## 2011-08-23 LAB — POCT INR: INR: 2.3

## 2011-08-25 ENCOUNTER — Ambulatory Visit (HOSPITAL_COMMUNITY)
Admission: RE | Admit: 2011-08-25 | Discharge: 2011-08-25 | Disposition: A | Discharge status: Home / Self Care | Payer: Medicare Other | Source: Ambulatory Visit | Attending: Internal Medicine | Admitting: Internal Medicine

## 2011-08-25 DIAGNOSIS — Z139 Encounter for screening, unspecified: Secondary | ICD-10-CM

## 2011-08-25 DIAGNOSIS — Z0000 Encounter for general adult medical examination without abnormal findings: Secondary | ICD-10-CM

## 2011-08-25 DIAGNOSIS — Z1231 Encounter for screening mammogram for malignant neoplasm of breast: Secondary | ICD-10-CM | POA: Insufficient documentation

## 2011-09-01 ENCOUNTER — Encounter: Payer: Self-pay | Admitting: Internal Medicine

## 2011-09-01 ENCOUNTER — Ambulatory Visit (INDEPENDENT_AMBULATORY_CARE_PROVIDER_SITE_OTHER): Payer: Medicare Other | Admitting: Internal Medicine

## 2011-09-01 VITALS — BP 134/82 | HR 77 | Temp 97.2°F | Ht 63.0 in | Wt 135.6 lb

## 2011-09-01 DIAGNOSIS — D126 Benign neoplasm of colon, unspecified: Secondary | ICD-10-CM

## 2011-09-01 DIAGNOSIS — Z86010 Personal history of colonic polyps: Secondary | ICD-10-CM

## 2011-09-01 DIAGNOSIS — K5900 Constipation, unspecified: Secondary | ICD-10-CM

## 2011-09-01 NOTE — Progress Notes (Signed)
Primary Care Physician:  Cassell Smiles., MD Primary Gastroenterologist:  Dr. Jena Gauss  Pre-Procedure History & Physical: HPI:  [name] is a 78 y.o. female here for followup. She needs to set up surveillance colonoscopy given she had high-grade adenomas removed  3 years ago. She chronically constipated. She has chronic abdominal bloating. Only taking a stool softener. She is on hydrocodone. Not taking MiraLax. Hasn't seen any rectal bleeding;weight down 4 pounds since her last office visit-last year. Continues to be on Coumadin for atrial fibrillation. Dysphagia nicely improved since I dilated her at EGD previously. She is followed closely by Dr. Diona Browner.  Past Medical History  Diagnosis Date  . Rheumatic heart disease   . IBS (irritable bowel syndrome)   . GERD (gastroesophageal reflux disease)   . Breast cancer   . Mitral regurgitation   . Mitral stenosis   . Atrial fibrillation   . PAT (paroxysmal atrial tachycardia)   . Hypothyroidism   . Melanoma of neck   . Microscopic colitis 2003    Was maintained on Asacol BID  . S/P colonoscopy April 2010    Friable anal canal, left-sided diverticula, adenomatous polyp  . S/P endoscopy April 2010    Benign polyp, s/p 54-F Maloney dilation  . Hiatal hernia   . Diverticula of colon   . Tubulovillous adenoma   . Adenomatous polyp     Past Surgical History  Procedure Date  . Cardiac catheterization     NORMAL CORONARY ARTERIES AT CATH IN 2006  . Abdominal hysterectomy   . Mastectomy     RIGHT BREAST, followed by left breast after radiation  . Melanoma removal     SURGERY NECK  . Cataracts   . Hemorrhoid surgery   . Tonsillectomy   . Colonoscopy 06/12/2008    Dr. Audelia Hives anal canal, left-sided diverticula, adenomatous polyp  . Esophagogastroduodenoscopy 06/12/2008    Dr. Larina Bras polyp, s/p 54-F Elease Hashimoto dilation    Prior to Admission medications   Medication Sig Start Date End Date Taking? Authorizing Provider    ALPRAZolam Prudy Feeler) 0.5 MG tablet Take 0.5 mg by mouth at bedtime as needed.  06/22/11  Yes Historical Provider, MD  amoxicillin (AMOXIL) 250 MG capsule Take 250 mg by mouth 2 (two) times daily.   Yes Historical Provider, MD  chlorthalidone (HYGROTON) 25 MG tablet Take 25 mg by mouth daily.     Yes Historical Provider, MD  dicyclomine (BENTYL) 10 MG capsule Take 10 mg by mouth 4 (four) times daily.     Yes Historical Provider, MD  diltiazem (CARDIZEM CD) 300 MG 24 hr capsule Take 1 capsule (300 mg total) by mouth daily. 06/15/11  Yes Jonelle Sidle, MD  furosemide (LASIX) 20 MG tablet Take 20 mg by mouth as needed.     Yes Historical Provider, MD  HYDROcodone-acetaminophen (LORTAB) 10-500 MG per tablet Take 1 tablet by mouth every 6 (six) hours as needed.     Yes Historical Provider, MD  hydrocortisone (CORTEF) 10 MG tablet Take 10 mg by mouth daily.     Yes Historical Provider, MD  levothyroxine (SYNTHROID, LEVOTHROID) 88 MCG tablet Take 88 mcg by mouth daily.     Yes Historical Provider, MD  pantoprazole (PROTONIX) 40 MG tablet Take 40 mg by mouth 2 (two) times daily.     Yes Historical Provider, MD  potassium chloride (KLOR-CON) 10 MEQ CR tablet Take 10 mEq by mouth daily.     Yes Historical Provider, MD  Probiotic Product (RESTORA) CAPS  Take by mouth daily.   Yes Historical Provider, MD  propranolol (INDERAL) 80 MG tablet Take 80 mg by mouth daily.     Yes Historical Provider, MD  warfarin (COUMADIN) 2.5 MG tablet Take 1 tablet (2.5 mg total) by mouth as directed. Takes 1 tablet daily except 1/2 tablet on M,W,F 06/15/11  Yes Jonelle Sidle, MD  Calcium Carbonate-Vit D-Min (CALTRATE 600+D PLUS PO) Take 400 mg by mouth.      Historical Provider, MD  Celecoxib (CELEBREX PO) Take by mouth.    Historical Provider, MD  diclofenac sodium (VOLTAREN) 1 % GEL Apply 1 application topically 3 (three) times daily.    Historical Provider, MD  LORazepam (ATIVAN) 0.5 MG tablet Take 0.5 mg by mouth as needed.       Historical Provider, MD    Allergies as of 09/01/2011 - Review Complete 09/01/2011  Allergen Reaction Noted  . Ciprofloxacin  06/07/2011  . Sulfonamide derivatives  10/23/2007    Family History  Problem Relation Age of Onset  . Stroke Mother   . Heart attack Father   . Breast cancer Sister   . Colon cancer Neg Hx     History   Social History  . Marital Status: Single    Spouse Name: N/A    Number of Children: N/A  . Years of Education: N/A   Occupational History  . RETIRED    Social History Main Topics  . Smoking status: Never Smoker   . Smokeless tobacco: Never Used  . Alcohol Use: No  . Drug Use: No  . Sexually Active: Not on file   Other Topics Concern  . Not on file   Social History Narrative  . No narrative on file    Review of Systems: See HPI, otherwise negative ROS  Physical Exam: BP 134/82  Pulse 77  Temp 97.2 F (36.2 C) (Temporal)  Ht 5\' 3"  (1.6 m)  Wt 135 lb 9.6 oz (61.508 kg)  BMI 24.02 kg/m2 General:   Alert,  somewhat feeble appear pleasant and cooperative in NAD Skin:  Intact without significant lesions or rashes. Eyes:  Sclera clear, no icterus.   Conjunctiva pink. Ears:  Normal auditory acuity. Nose:  No deformity, discharge,  or lesions. Mouth:  No deformity or lesions. Neck:  Supple; no masses or thyromegaly. No significant cervical adenopathy. Lungs:  Clear throughout to auscultation.   No wheezes, crackles, or rhonchi. No acute distress. Heart: Irregularly irregular rhythm with faint diastolic murmur, clicks, rubs,  or gallops. Abdomen: Non-distended, normal bowel sounds.  Soft with minimal epigastric tenderness to palpation; no appreciable mass or hepatosplenomegaly.  Pulses:  Normal pulses noted. Extremities:  Without clubbing or edema.  Impression/Plan:   Pleasant 78 year old lady with multiple medical problems with a history of tubular and tubulovillous adenomas removed from her colon in 2010. She needs a surveillance  examination at this time. She is chronically constipated. She is on an adequate bowel regimen. She takes hydrocodone as well.  Recommendations I've offered this nice lady a surveillance colonoscopy.The risks, benefits, limitations, alternatives and imponderables have been reviewed with the patient. Questions have been answered. All parties are agreeable. I recommended she take MiraLax 17 g orally at bedtime as needed for constipation. Her Coumadin will need to be stopped 4 days prior to the procedure. We'll touch base with Dr. Diona Browner to see if we can simply hold the agent without ridging. Further recommendations to follow.

## 2011-09-01 NOTE — Patient Instructions (Addendum)
Schedule colonoscopy to follow-up on polyps  Will need to ask Dr. Diona Browner if you can come off coumadin x 4 days prior to colonoscopy  Take Miralax 17 grams at bedtime as needed for constipation

## 2011-09-03 ENCOUNTER — Encounter: Payer: Self-pay | Admitting: Internal Medicine

## 2011-09-05 ENCOUNTER — Telehealth: Payer: Self-pay | Admitting: Cardiology

## 2011-09-05 ENCOUNTER — Telehealth: Payer: Self-pay

## 2011-09-05 NOTE — Telephone Encounter (Signed)
Pt. was last seen by Dr. Diona Browner on 4-25 and was advised to have Echo which pt. did on 5-3 with result of Mild degree of mitral stenosis and regurgitation, overall stable. Pt. scheduled to have Colonoscopy with [facility] under moderate sedation and needs to hold Coumadin 4 days prior to procedure. Please advise./LV

## 2011-09-05 NOTE — Telephone Encounter (Signed)
NEED TO HOLD COUMADIN FOR 4 DAYS FOR COLONOSCOPY.

## 2011-09-05 NOTE — Telephone Encounter (Signed)
Called to see status of request faxed on 09/01/2011 inquiring about holding coumadin x 4 days to schedule procedure. Per Archie Patten, Dr. Diona Browner has been out, she will type a message and send to him.

## 2011-09-06 NOTE — Telephone Encounter (Signed)
Faxed to [facility]./LV

## 2011-09-06 NOTE — Telephone Encounter (Signed)
She should be able to hold Coumadin as requested. No clear indication for bridging with Lovenox. Will also forward to Coumadin clinic.

## 2011-09-07 ENCOUNTER — Ambulatory Visit: Payer: Medicare Other | Admitting: Cardiology

## 2011-09-08 ENCOUNTER — Other Ambulatory Visit: Payer: Self-pay | Admitting: Internal Medicine

## 2011-09-08 DIAGNOSIS — K635 Polyp of colon: Secondary | ICD-10-CM

## 2011-09-08 DIAGNOSIS — D126 Benign neoplasm of colon, unspecified: Secondary | ICD-10-CM

## 2011-09-08 DIAGNOSIS — K5900 Constipation, unspecified: Secondary | ICD-10-CM

## 2011-09-08 MED ORDER — PEG 3350-KCL-NA BICARB-NACL 420 G PO SOLR: ORAL | Status: AC

## 2011-09-08 NOTE — Telephone Encounter (Signed)
[name] is scheduled for Colonoscopy on Thursday 8/08 at 12:00 arrival time is 11:00 and instructions were mailed to the patient and she is aware

## 2011-09-08 NOTE — Telephone Encounter (Signed)
Please see separate phone note of Dr. Ival Bible dated 09/05/2011 . Routing to Soledad Gerlach to schedule.

## 2011-09-15 ENCOUNTER — Encounter (HOSPITAL_COMMUNITY): Payer: Self-pay | Admitting: Pharmacy Technician

## 2011-09-27 ENCOUNTER — Ambulatory Visit: Payer: Medicare Other | Admitting: Cardiology

## 2011-09-27 MED ORDER — SODIUM CHLORIDE 0.45 % IV SOLN
Freq: Once | INTRAVENOUS | Status: AC
  Administered 2011-09-28: 1000 mL via INTRAVENOUS

## 2011-09-28 ENCOUNTER — Ambulatory Visit (HOSPITAL_COMMUNITY)
Admission: RE | Admit: 2011-09-28 | Discharge: 2011-09-28 | Disposition: A | Discharge status: Home / Self Care | Payer: Medicare Other | Source: Ambulatory Visit | Attending: Internal Medicine | Admitting: Internal Medicine

## 2011-09-28 ENCOUNTER — Encounter (HOSPITAL_COMMUNITY): Payer: Self-pay | Admitting: *Deleted

## 2011-09-28 ENCOUNTER — Encounter (HOSPITAL_COMMUNITY)
Admission: RE | Disposition: A | Discharge status: Home / Self Care | Payer: Self-pay | Source: Ambulatory Visit | Attending: Internal Medicine

## 2011-09-28 DIAGNOSIS — Z1211 Encounter for screening for malignant neoplasm of colon: Secondary | ICD-10-CM

## 2011-09-28 DIAGNOSIS — Z7901 Long term (current) use of anticoagulants: Secondary | ICD-10-CM | POA: Insufficient documentation

## 2011-09-28 DIAGNOSIS — K5900 Constipation, unspecified: Secondary | ICD-10-CM

## 2011-09-28 DIAGNOSIS — D126 Benign neoplasm of colon, unspecified: Secondary | ICD-10-CM

## 2011-09-28 DIAGNOSIS — I4891 Unspecified atrial fibrillation: Secondary | ICD-10-CM | POA: Insufficient documentation

## 2011-09-28 DIAGNOSIS — Z860100 Personal history of colon polyps, unspecified: Secondary | ICD-10-CM | POA: Insufficient documentation

## 2011-09-28 DIAGNOSIS — K5730 Diverticulosis of large intestine without perforation or abscess without bleeding: Secondary | ICD-10-CM

## 2011-09-28 DIAGNOSIS — K635 Polyp of colon: Secondary | ICD-10-CM

## 2011-09-28 DIAGNOSIS — Z09 Encounter for follow-up examination after completed treatment for conditions other than malignant neoplasm: Secondary | ICD-10-CM | POA: Insufficient documentation

## 2011-09-28 HISTORY — PX: COLONOSCOPY: SHX5424

## 2011-09-28 SURGERY — COLONOSCOPY
Anesthesia: Moderate Sedation

## 2011-09-28 MED ORDER — MIDAZOLAM HCL 5 MG/5ML IJ SOLN
INTRAMUSCULAR | Status: AC
  Filled 2011-09-28: qty 10

## 2011-09-28 MED ORDER — MEPERIDINE HCL 100 MG/ML IJ SOLN
INTRAMUSCULAR | Status: AC
  Filled 2011-09-28: qty 1

## 2011-09-28 MED ORDER — MEPERIDINE HCL 100 MG/ML IJ SOLN
INTRAMUSCULAR | Status: DC | PRN
  Administered 2011-09-28 (×2): 25 mg via INTRAVENOUS

## 2011-09-28 MED ORDER — STERILE WATER FOR IRRIGATION IR SOLN
Status: DC | PRN
  Administered 2011-09-28: 13:00:00

## 2011-09-28 MED ORDER — MIDAZOLAM HCL 5 MG/5ML IJ SOLN
INTRAMUSCULAR | Status: DC | PRN
  Administered 2011-09-28: 2 mg via INTRAVENOUS
  Administered 2011-09-28 (×2): 1 mg via INTRAVENOUS

## 2011-09-28 NOTE — Interval H&P Note (Signed)
History and Physical Interval Note:  09/28/2011 12:41 PM  [name]  has presented today for surgery, with the diagnosis of Colonic  Adenoma, Constipation and colonic polyp  The various methods of treatment have been discussed with the patient and family. After consideration of risks, benefits and other options for treatment, the patient has consented to  Procedure(s) (LRB): COLONOSCOPY (N/A) as a surgical intervention .  The patient's history has been reviewed, patient examined, no change in status, stable for surgery.  I have reviewed the patient's chart and labs.  Questions were answered to the patient's satisfaction.     Dotti Busey  Coumadin held 4 days ago per plan.

## 2011-09-28 NOTE — H&P (View-Only) (Signed)
Primary Care Physician:  FUSCO,LAWRENCE J., MD Primary Gastroenterologist:  Dr. Daneen Volcy  Pre-Procedure History & Physical: HPI:  [name] is a 78 y.o. female here for followup. She needs to set up surveillance colonoscopy given she had high-grade adenomas removed  3 years ago. She chronically constipated. She has chronic abdominal bloating. Only taking a stool softener. She is on hydrocodone. Not taking MiraLax. Hasn't seen any rectal bleeding;weight down 4 pounds since her last office visit-last year. Continues to be on Coumadin for atrial fibrillation. Dysphagia nicely improved since I dilated her at EGD previously. She is followed closely by Dr. McDowell.  Past Medical History  Diagnosis Date  . Rheumatic heart disease   . IBS (irritable bowel syndrome)   . GERD (gastroesophageal reflux disease)   . Breast cancer   . Mitral regurgitation   . Mitral stenosis   . Atrial fibrillation   . PAT (paroxysmal atrial tachycardia)   . Hypothyroidism   . Melanoma of neck   . Microscopic colitis 2003    Was maintained on Asacol BID  . S/P colonoscopy April 2010    Friable anal canal, left-sided diverticula, adenomatous polyp  . S/P endoscopy April 2010    Benign polyp, s/p 54-F Maloney dilation  . Hiatal hernia   . Diverticula of colon   . Tubulovillous adenoma   . Adenomatous polyp     Past Surgical History  Procedure Date  . Cardiac catheterization     NORMAL CORONARY ARTERIES AT CATH IN 2006  . Abdominal hysterectomy   . Mastectomy     RIGHT BREAST, followed by left breast after radiation  . Melanoma removal     SURGERY NECK  . Cataracts   . Hemorrhoid surgery   . Tonsillectomy   . Colonoscopy 06/12/2008    Dr. Zhi Geier-Friable anal canal, left-sided diverticula, adenomatous polyp  . Esophagogastroduodenoscopy 06/12/2008    Dr. Aidric Endicott-Benign polyp, s/p 54-F Maloney dilation    Prior to Admission medications   Medication Sig Start Date End Date Taking? Authorizing Provider    ALPRAZolam (XANAX) 0.5 MG tablet Take 0.5 mg by mouth at bedtime as needed.  06/22/11  Yes Historical Provider, MD  amoxicillin (AMOXIL) 250 MG capsule Take 250 mg by mouth 2 (two) times daily.   Yes Historical Provider, MD  chlorthalidone (HYGROTON) 25 MG tablet Take 25 mg by mouth daily.     Yes Historical Provider, MD  dicyclomine (BENTYL) 10 MG capsule Take 10 mg by mouth 4 (four) times daily.     Yes Historical Provider, MD  diltiazem (CARDIZEM CD) 300 MG 24 hr capsule Take 1 capsule (300 mg total) by mouth daily. 06/15/11  Yes Samuel G McDowell, MD  furosemide (LASIX) 20 MG tablet Take 20 mg by mouth as needed.     Yes Historical Provider, MD  HYDROcodone-acetaminophen (LORTAB) 10-500 MG per tablet Take 1 tablet by mouth every 6 (six) hours as needed.     Yes Historical Provider, MD  hydrocortisone (CORTEF) 10 MG tablet Take 10 mg by mouth daily.     Yes Historical Provider, MD  levothyroxine (SYNTHROID, LEVOTHROID) 88 MCG tablet Take 88 mcg by mouth daily.     Yes Historical Provider, MD  pantoprazole (PROTONIX) 40 MG tablet Take 40 mg by mouth 2 (two) times daily.     Yes Historical Provider, MD  potassium chloride (KLOR-CON) 10 MEQ CR tablet Take 10 mEq by mouth daily.     Yes Historical Provider, MD  Probiotic Product (RESTORA) CAPS   Take by mouth daily.   Yes Historical Provider, MD  propranolol (INDERAL) 80 MG tablet Take 80 mg by mouth daily.     Yes Historical Provider, MD  warfarin (COUMADIN) 2.5 MG tablet Take 1 tablet (2.5 mg total) by mouth as directed. Takes 1 tablet daily except 1/2 tablet on M,W,F 06/15/11  Yes Samuel G McDowell, MD  Calcium Carbonate-Vit D-Min (CALTRATE 600+D PLUS PO) Take 400 mg by mouth.      Historical Provider, MD  Celecoxib (CELEBREX PO) Take by mouth.    Historical Provider, MD  diclofenac sodium (VOLTAREN) 1 % GEL Apply 1 application topically 3 (three) times daily.    Historical Provider, MD  LORazepam (ATIVAN) 0.5 MG tablet Take 0.5 mg by mouth as needed.       Historical Provider, MD    Allergies as of 09/01/2011 - Review Complete 09/01/2011  Allergen Reaction Noted  . Ciprofloxacin  06/07/2011  . Sulfonamide derivatives  10/23/2007    Family History  Problem Relation Age of Onset  . Stroke Mother   . Heart attack Father   . Breast cancer Sister   . Colon cancer Neg Hx     History   Social History  . Marital Status: Single    Spouse Name: N/A    Number of Children: N/A  . Years of Education: N/A   Occupational History  . RETIRED    Social History Main Topics  . Smoking status: Never Smoker   . Smokeless tobacco: Never Used  . Alcohol Use: No  . Drug Use: No  . Sexually Active: Not on file   Other Topics Concern  . Not on file   Social History Narrative  . No narrative on file    Review of Systems: See HPI, otherwise negative ROS  Physical Exam: BP 134/82  Pulse 77  Temp 97.2 F (36.2 C) (Temporal)  Ht 5' 3" (1.6 m)  Wt 135 lb 9.6 oz (61.508 kg)  BMI 24.02 kg/m2 General:   Alert,  somewhat feeble appear pleasant and cooperative in NAD Skin:  Intact without significant lesions or rashes. Eyes:  Sclera clear, no icterus.   Conjunctiva pink. Ears:  Normal auditory acuity. Nose:  No deformity, discharge,  or lesions. Mouth:  No deformity or lesions. Neck:  Supple; no masses or thyromegaly. No significant cervical adenopathy. Lungs:  Clear throughout to auscultation.   No wheezes, crackles, or rhonchi. No acute distress. Heart: Irregularly irregular rhythm with faint diastolic murmur, clicks, rubs,  or gallops. Abdomen: Non-distended, normal bowel sounds.  Soft with minimal epigastric tenderness to palpation; no appreciable mass or hepatosplenomegaly.  Pulses:  Normal pulses noted. Extremities:  Without clubbing or edema.  Impression/Plan:   Pleasant 78-year-old lady with multiple medical problems with a history of tubular and tubulovillous adenomas removed from her colon in 2010. She needs a surveillance  examination at this time. She is chronically constipated. She is on an adequate bowel regimen. She takes hydrocodone as well.  Recommendations I've offered this nice lady a surveillance colonoscopy.The risks, benefits, limitations, alternatives and imponderables have been reviewed with the patient. Questions have been answered. All parties are agreeable. I recommended she take MiraLax 17 g orally at bedtime as needed for constipation. Her Coumadin will need to be stopped 4 days prior to the procedure. We'll touch base with Dr. McDowell to see if we can simply hold the agent without ridging. Further recommendations to follow.  

## 2011-09-28 NOTE — Op Note (Signed)
[facility] [address] [place], [place]  [address]  COLONOSCOPY PROCEDURE REPORT  PATIENT:  [name], [name]  MR#:  [id number] BIRTHDATE:  [birth year], 82 yrs. old  GENDER:  female ENDOSCOPIST:  R. Roetta Sessions, MD FACP [facility] REF. BY:  Artis Delay, M.D. PROCEDURE DATE:  09/28/2011 PROCEDURE:  Colonoscopy with snare polypectomy  INDICATIONS:  Surveillance colonoscopy; history of high-grade adenomas previously  INFORMED CONSENT:  The risks, benefits, alternatives and imponderables including but not limited to bleeding, perforation as well as the possibility of a missed lesion have been reviewed. The potential for biopsy, lesion removal, etc. have also been discussed.  Questions have been answered.  All parties agreeable. Please see the history and physical in the medical record for more information.  MEDICATIONS:  Versed 4 mg IV and Demerol 50 mg IV in divided doses.  DESCRIPTION OF PROCEDURE:  After a digital rectal exam was performed, the EC-[id number] ([id number]) colonoscope was advanced from the anus through the rectum and colon to the area of the cecum, ileocecal valve and appendiceal orifice.  The cecum was deeply intubated.  These structures were well-seen and photographed for the record.  From the level of the cecum and ileocecal valve, the scope was slowly and cautiously withdrawn.  The mucosal surfaces were carefully surveyed utilizing scope tip deflection to facilitate fold flattening as needed.  The scope was pulled down into the rectum where a thorough examination including retroflexion was performed. <<PROCEDUREIMAGES>>  FINDINGS: Adequate preparation. Normal rectum. Left-sided diverticula. (3) 6 mm pedunculated polyps in the vicinity of the hepatic flexure; remainder of colonic mucosa appeared normal.  THERAPEUTIC / DIAGNOSTIC MANEUVERS PERFORMED:    The 3 above-mentioned polyps were either hot or cold snared/removed.  COMPLICATIONS:  None  CECAL  WITHDRAWAL TIME:   11 minutes  IMPRESSION:  Colonic polyps-removed as described above. Colonic diverticulosis.  RECOMMENDATIONS:  Resume Coumadin today. Followup on pathology.  ______________________________ R. Roetta Sessions, MD Caleen Essex  CC:  Artis Delay, M.D.  n. eSIGNED:   R. Roetta Sessions at 09/28/2011 01:35 PM  Alonna Buckler, [phone]

## 2011-10-03 ENCOUNTER — Encounter: Payer: Self-pay | Admitting: *Deleted

## 2011-10-03 ENCOUNTER — Encounter (HOSPITAL_COMMUNITY): Payer: Self-pay | Admitting: Internal Medicine

## 2011-10-03 ENCOUNTER — Encounter: Payer: Self-pay | Admitting: Internal Medicine

## 2011-10-04 ENCOUNTER — Ambulatory Visit (INDEPENDENT_AMBULATORY_CARE_PROVIDER_SITE_OTHER): Payer: Medicare Other | Admitting: *Deleted

## 2011-10-04 DIAGNOSIS — Z7901 Long term (current) use of anticoagulants: Secondary | ICD-10-CM

## 2011-10-04 DIAGNOSIS — I4891 Unspecified atrial fibrillation: Secondary | ICD-10-CM

## 2011-10-04 LAB — POCT INR: INR: 1.8

## 2011-10-11 ENCOUNTER — Ambulatory Visit (INDEPENDENT_AMBULATORY_CARE_PROVIDER_SITE_OTHER): Payer: Medicaid Other | Admitting: Cardiology

## 2011-10-11 ENCOUNTER — Encounter: Payer: Self-pay | Admitting: Cardiology

## 2011-10-11 ENCOUNTER — Ambulatory Visit (INDEPENDENT_AMBULATORY_CARE_PROVIDER_SITE_OTHER): Payer: Medicare Other | Admitting: *Deleted

## 2011-10-11 VITALS — BP 110/80 | HR 72 | Wt 149.0 lb

## 2011-10-11 DIAGNOSIS — I4891 Unspecified atrial fibrillation: Secondary | ICD-10-CM

## 2011-10-11 DIAGNOSIS — Z7901 Long term (current) use of anticoagulants: Secondary | ICD-10-CM

## 2011-10-11 DIAGNOSIS — I050 Rheumatic mitral stenosis: Secondary | ICD-10-CM

## 2011-10-11 LAB — POCT INR: INR: 2.9

## 2011-10-11 NOTE — Assessment & Plan Note (Signed)
Mild, associated with mild regurgitation.

## 2011-10-11 NOTE — Assessment & Plan Note (Signed)
Persistent. Rate is better controlled. Will continue current dose of Cardizem. Cautioned her that if she does feel dizziness or has falls/syncope, she should let us know. Possibility of bradycardia or pauses could be further evaluated with a monitor.

## 2011-10-11 NOTE — Patient Instructions (Addendum)
Your physician recommends that you schedule a follow-up appointment in: 3 months.  

## 2011-10-11 NOTE — Progress Notes (Signed)
Clinical Summary [name] is a 78 y.o.female presenting for followup. She was last seen back in April. She is referred back to the office by Dr. Sherwood Gambler with question of falls and syncope. The patient tells me that she has not had any falls. She denies dizziness and states that her dyspnea has been better since increasing her Cardizem at the least visit.  ECG today shows atrial fibrillation with poor R wave progression, nonspecific T waves changes.  Followup echocardiogram in May of this year showed LVEF of 55-60%, mild to moderate aortic rotation, mild mitral stenosis and regurgitation, moderately dilated left atrium and right atrium.  She had a colonoscopy recently with polyps removed. Was off Coumadin temporarily.   Allergies  Allergen Reactions  . Ciprofloxacin Nausea And Vomiting  . Sulfonamide Derivatives Nausea And Vomiting and Rash    Current Outpatient Prescriptions  Medication Sig Dispense Refill  . ALPRAZolam (XANAX) 0.5 MG tablet Take 0.5 mg by mouth at bedtime as needed.       . chlorthalidone (HYGROTON) 25 MG tablet Take 25 mg by mouth daily.        . diclofenac sodium (VOLTAREN) 1 % GEL Apply 1 application topically 3 (three) times daily.      Marland Kitchen dicyclomine (BENTYL) 10 MG capsule Take 10 mg by mouth daily as needed.       . diltiazem (CARDIZEM CD) 300 MG 24 hr capsule Take 1 capsule (300 mg total) by mouth daily.  30 capsule  6  . furosemide (LASIX) 20 MG tablet Take 20 mg by mouth as needed. Swelling      . HYDROcodone-acetaminophen (LORTAB) 10-500 MG per tablet Take 1 tablet by mouth every 6 (six) hours as needed.        . hydrocortisone (CORTEF) 10 MG tablet Take 10 mg by mouth daily.      Marland Kitchen levothyroxine (SYNTHROID, LEVOTHROID) 125 MCG tablet Take 125 mcg by mouth daily.      . pantoprazole (PROTONIX) 40 MG tablet Take 40 mg by mouth 2 (two) times daily.        . potassium chloride (KLOR-CON) 10 MEQ CR tablet Take 10 mEq by mouth 3 (three) times daily.       .  Probiotic Product (RESTORA) CAPS Take by mouth daily.      . propranolol (INDERAL) 80 MG tablet Take 80 mg by mouth daily.        . traMADol (ULTRAM) 50 MG tablet       . warfarin (COUMADIN) 2.5 MG tablet Take 2.5 mg by mouth as directed. Takes 1.25 mg on Monday, Wednesday, Friday. Takes 2.5 mg on Tuesday, Thursday, Saturday, and Sunday.        Past Medical History  Diagnosis Date  . Rheumatic heart disease   . IBS (irritable bowel syndrome)   . GERD (gastroesophageal reflux disease)   . Breast cancer   . Mitral regurgitation   . Mitral stenosis   . Atrial fibrillation   . PAT (paroxysmal atrial tachycardia)   . Hypothyroidism   . Melanoma of neck   . Microscopic colitis 2003    Was maintained on Asacol BID  . S/P colonoscopy April 2010    Friable anal canal, left-sided diverticula, adenomatous polyp  . S/P endoscopy April 2010    Benign polyp, s/p 54-F Maloney dilation  . Hiatal hernia   . Diverticula of colon   . Tubulovillous adenoma   . Adenomatous polyp     Social History [name]  reports that she has never smoked. She has never used smokeless tobacco. Ms. Morrissette reports that she does not drink alcohol.   Review of Systems As outlined above. Also had anxiety. Otherwise negative.  Physical Examination Filed Vitals:   10/11/11 1339  BP: 110/80  Pulse: 72    Elderly woman in no acute distress.  HEENT: Conjunctiva and lids are normal, oropharynx clear.  Neck: Supple, no elevated jugular venous pressure, no carotid bruits. No thyromegaly.  Lungs: Diminished breath sounds, no rales, nonlabored.  Cardiac: Irregularly irregular, 2/6 apical systolic murmur, no S3 gallop, no pericardial rub.  Abdomen: Soft, nontender, bowel sounds present.  Extremities: 1-2+ edema left greater than right, distal pulses one plus.  Neuropsychiatric: Alert oriented x3, affect appropriate.  Skin: Warm and dry.  Musculoskeletal: Kyphosis noted.   Problem List and Plan   Atrial  fibrillation Persistent. Rate is better controlled. Will continue current dose of Cardizem. Cautioned her that if she does feel dizziness or has falls/syncope, she should let us know. Possibility of bradycardia or pauses could be further evaluated with a monitor.  Mitral stenosis Mild, associated with mild regurgitation.    Jonelle Sidle, M.D., F.A.C.C.

## 2011-11-14 ENCOUNTER — Encounter: Payer: Self-pay | Admitting: Internal Medicine

## 2011-11-14 ENCOUNTER — Ambulatory Visit (INDEPENDENT_AMBULATORY_CARE_PROVIDER_SITE_OTHER): Payer: Medicare Other | Admitting: Internal Medicine

## 2011-11-14 ENCOUNTER — Other Ambulatory Visit: Payer: Self-pay | Admitting: Cardiology

## 2011-11-14 VITALS — BP 115/58 | HR 80 | Temp 98.4°F | Ht 63.0 in | Wt 151.8 lb

## 2011-11-14 DIAGNOSIS — R140 Abdominal distension (gaseous): Secondary | ICD-10-CM

## 2011-11-14 DIAGNOSIS — K5900 Constipation, unspecified: Secondary | ICD-10-CM

## 2011-11-14 DIAGNOSIS — R141 Gas pain: Secondary | ICD-10-CM

## 2011-11-14 NOTE — Progress Notes (Deleted)
Primary Care Physician:  Cassell Smiles., MD Primary Gastroenterologist:  Dr. Jena Gauss  Pre-Procedure History & Physical: HPI:  [name] is a 78 y.o. female here for further evaluation of abdominal bloating and worsening of chronic constipation chronic narcotic therapy. This nice lady underwent a colonoscopy on August 8 of this year by me. She had some adenomatous polyps removed. She also colonic diverticulosis. She states she did very well for about 2 weeks after the procedure and then started having vagu    e abdominal bloating and difficulty having a bowel movement. Passes hard Past Medical History  Diagnosis Date  . Rheumatic heart disease   . IBS (irritable bowel syndrome)   . GERD (gastroesophageal reflux disease)   . Breast cancer   . Mitral regurgitation   . Mitral stenosis   . Atrial fibrillation   . PAT (paroxysmal atrial tachycardia)   . Hypothyroidism   . Melanoma of neck   . Microscopic colitis 2003    Was maintained on Asacol BID  . S/P colonoscopy April 2010    Friable anal canal, left-sided diverticula, adenomatous polyp  . S/P endoscopy April 2010    Benign polyp, s/p 54-F Maloney dilation  . Hiatal hernia   . Diverticula of colon   . Tubulovillous adenoma   . Adenomatous polyp     Past Surgical History  Procedure Date  . Cardiac catheterization     NORMAL CORONARY ARTERIES AT CATH IN 2006  . Abdominal hysterectomy   . Mastectomy     RIGHT BREAST, followed by left breast after radiation  . Melanoma removal     SURGERY NECK  . Cataracts   . Hemorrhoid surgery   . Tonsillectomy   . Colonoscopy 06/12/2008    Dr. Audelia Hives anal canal, left-sided diverticula, adenomatous polyp  . Esophagogastroduodenoscopy 06/12/2008    Dr. Larina Bras polyp, s/p 54-F Elease Hashimoto dilation  . Colonoscopy 09/28/2011    Dr. Jena Gauss- tubular adenoma, colonic diverticulitis    Prior to Admission medications   Medication Sig Start Date End Date Taking? Authorizing Provider    ALPRAZolam Prudy Feeler) 0.5 MG tablet Take 0.5 mg by mouth at bedtime as needed.  06/22/11  Yes Historical Provider, MD  chlorthalidone (HYGROTON) 25 MG tablet Take 25 mg by mouth daily.     Yes Historical Provider, MD  diclofenac sodium (VOLTAREN) 1 % GEL Apply 1 application topically 3 (three) times daily.   Yes Historical Provider, MD  dicyclomine (BENTYL) 10 MG capsule Take 10 mg by mouth daily as needed.    Yes Historical Provider, MD  diltiazem (CARDIZEM CD) 300 MG 24 hr capsule Take 1 capsule (300 mg total) by mouth daily. 06/15/11  Yes Jonelle Sidle, MD  furosemide (LASIX) 20 MG tablet Take 20 mg by mouth as needed. Swelling   Yes Historical Provider, MD  HYDROcodone-acetaminophen (LORTAB) 10-500 MG per tablet Take 1 tablet by mouth every 6 (six) hours as needed.     Yes Historical Provider, MD  hydrocortisone (CORTEF) 10 MG tablet Take 10 mg by mouth daily.   Yes Historical Provider, MD  levothyroxine (SYNTHROID, LEVOTHROID) 125 MCG tablet Take 125 mcg by mouth daily.   Yes Historical Provider, MD  pantoprazole (PROTONIX) 40 MG tablet Take 40 mg by mouth 2 (two) times daily.     Yes Historical Provider, MD  potassium chloride (KLOR-CON) 10 MEQ CR tablet Take 10 mEq by mouth 3 (three) times daily.    Yes Historical Provider, MD  Probiotic Product (RESTORA) CAPS Take  by mouth daily.   Yes Historical Provider, MD  propranolol (INDERAL) 80 MG tablet Take 80 mg by mouth daily.     Yes Historical Provider, MD  traMADol (ULTRAM) 50 MG tablet Take 50 mg by mouth every 6 (six) hours as needed.  09/25/11  Yes Historical Provider, MD  warfarin (COUMADIN) 2.5 MG tablet Take 2.5 mg by mouth as directed. Takes 1.25 mg on Monday, Wednesday, Friday. Takes 2.5 mg on Tuesday, Thursday, Saturday, and Sunday. 06/15/11  Yes Jonelle Sidle, MD  metroNIDAZOLE (FLAGYL) 250 MG tablet Take 250 mg by mouth 3 (three) times daily.  10/27/11   Historical Provider, MD    Allergies as of 11/14/2011 - Review Complete  11/14/2011  Allergen Reaction Noted  . Ciprofloxacin Nausea And Vomiting 06/07/2011  . Sulfonamide derivatives Nausea And Vomiting and Rash 10/23/2007    Family History  Problem Relation Age of Onset  . Stroke Mother   . Heart attack Father   . Breast cancer Sister   . Colon cancer Neg Hx     History   Social History  . Marital Status: Single    Spouse Name: N/A    Number of Children: N/A  . Years of Education: N/A   Occupational History  . RETIRED    Social History Main Topics  . Smoking status: Never Smoker   . Smokeless tobacco: Never Used  . Alcohol Use: No  . Drug Use: No  . Sexually Active: Not on file   Other Topics Concern  . Not on file   Social History Narrative  . No narrative on file    Review of Systems: See HPI, otherwise negative ROS  Physical Exam: BP 115/58  Pulse 80  Temp 98.4 F (36.9 C) (Temporal)  Ht 5\' 3"  (1.6 m)  Wt 151 lb 12.8 oz (68.856 kg)  BMI 26.89 kg/m2 General:   Alert,  Well-developed, well-nourished, pleasant and cooperative in NAD Skin:  Intact without significant lesions or rashes. Eyes:  Sclera clear, no icterus.   Conjunctiva pink. Ears:  Normal auditory acuity. Nose:  No deformity, discharge,  or lesions. Mouth:  No deformity or lesions. Neck:  Supple; no masses or thyromegaly. No significant cervical adenopathy. Lungs:  Clear throughout to auscultation.   No wheezes, crackles, or rhonchi. No acute distress. Heart:  Regular rate and rhythm; no murmurs, clicks, rubs,  or gallops. Abdomen: Non-distended, normal bowel sounds.  Soft and nontender without appreciable mass or hepatosplenomegaly.  Pulses:  Normal pulses noted. Extremities:  Without clubbing or edema.  Impression/Plan:  Vague abdominal discomfort in the setting of progressive constipation / chronic narcotic therapy. It sounds as though bowel function has slowed down and this may have more to do with her abdominal bloating and discomfort than anything else. I  do not detect a surgical process at this time. She does not appear acutely ill or toxic. Has had no symptoms at all consistent with diarrhea. Gallbladder is out. Symptoms will be atypical for mesenteric ischemia. Does not sound like acid peptic disease.   Recommendations: Continue Colace 100 mg orally twice daily. No more metronidazole. Add MiraLax 17 g orally nightly to her regimen for the next week. Continue probiotic therapy. Patient is to let me know on September 30 how she is doing and we will go from there. If her symptoms aren't significantly ameliorated with the above regimen at that time , we will embark on further diagnostic studies. If she gets worse in the interim, she is to  let me know.

## 2011-11-14 NOTE — Progress Notes (Signed)
Primary Care Physician:  Cassell Smiles., MD Primary Gastroenterologist:  Dr. Jena Gauss  Pre-Procedure History & Physical: HPI:  [name] is a 78 y.o. female here for evaluation of one-month history of abdominal bloating in the setting of constipation/chronic or chronic and diltiazem therapy. Had a colonoscopy back on August 8 was found to have diverticulosis and a few adenomas which were removed. She did just fine until a couple of weeks and started having the above-mentioned symptoms. Vaguely, symptoms worsen after eating. No frank abdominal pain. Certainly no diarrhea. Has utilized preparation H., enemas and Colace to have a few hard "balls" of stool daily. Was given metronidazole recently for "colitis". One dose - made her sick. She takes probiotic therapy chronically. Has MiraLax at home but has not tried it. Gallbladder is out. No melena no rectal bleeding.  Has a distant history of microscopic colitis but no diarrhea and years. Has not had any fever chills. Patient tells me that Dr Diona Browner reported to her that her heart failure is somewhat worse.  Past Medical History  Diagnosis Date  . Rheumatic heart disease   . IBS (irritable bowel syndrome)   . GERD (gastroesophageal reflux disease)   . Breast cancer   . Mitral regurgitation   . Mitral stenosis   . Atrial fibrillation   . PAT (paroxysmal atrial tachycardia)   . Hypothyroidism   . Melanoma of neck   . Microscopic colitis 2003    Was maintained on Asacol BID  . S/P colonoscopy April 2010    Friable anal canal, left-sided diverticula, adenomatous polyp  . S/P endoscopy April 2010    Benign polyp, s/p 54-F Maloney dilation  . Hiatal hernia   . Diverticula of colon   . Tubulovillous adenoma   . Adenomatous polyp     Past Surgical History  Procedure Date  . Cardiac catheterization     NORMAL CORONARY ARTERIES AT CATH IN 2006  . Abdominal hysterectomy   . Mastectomy     RIGHT BREAST, followed by left breast after radiation    . Melanoma removal     SURGERY NECK  . Cataracts   . Hemorrhoid surgery   . Tonsillectomy   . Colonoscopy 06/12/2008    Dr. Audelia Hives anal canal, left-sided diverticula, adenomatous polyp  . Esophagogastroduodenoscopy 06/12/2008    Dr. Larina Bras polyp, s/p 54-F Elease Hashimoto dilation  . Colonoscopy 09/28/2011    Dr. Jena Gauss- tubular adenoma, colonic diverticulitis    Prior to Admission medications   Medication Sig Start Date End Date Taking? Authorizing Provider  ALPRAZolam Prudy Feeler) 0.5 MG tablet Take 0.5 mg by mouth at bedtime as needed.  06/22/11  Yes Historical Provider, MD  chlorthalidone (HYGROTON) 25 MG tablet Take 25 mg by mouth daily.     Yes Historical Provider, MD  diclofenac sodium (VOLTAREN) 1 % GEL Apply 1 application topically 3 (three) times daily.   Yes Historical Provider, MD  dicyclomine (BENTYL) 10 MG capsule Take 10 mg by mouth daily as needed.    Yes Historical Provider, MD  diltiazem (CARDIZEM CD) 300 MG 24 hr capsule Take 1 capsule (300 mg total) by mouth daily. 06/15/11  Yes Jonelle Sidle, MD  furosemide (LASIX) 20 MG tablet Take 20 mg by mouth as needed. Swelling   Yes Historical Provider, MD  HYDROcodone-acetaminophen (LORTAB) 10-500 MG per tablet Take 1 tablet by mouth every 6 (six) hours as needed.     Yes Historical Provider, MD  hydrocortisone (CORTEF) 10 MG tablet Take 10 mg by  mouth daily.   Yes Historical Provider, MD  levothyroxine (SYNTHROID, LEVOTHROID) 125 MCG tablet Take 125 mcg by mouth daily.   Yes Historical Provider, MD  pantoprazole (PROTONIX) 40 MG tablet Take 40 mg by mouth 2 (two) times daily.     Yes Historical Provider, MD  potassium chloride (KLOR-CON) 10 MEQ CR tablet Take 10 mEq by mouth 3 (three) times daily.    Yes Historical Provider, MD  Probiotic Product (RESTORA) CAPS Take by mouth daily.   Yes Historical Provider, MD  propranolol (INDERAL) 80 MG tablet Take 80 mg by mouth daily.     Yes Historical Provider, MD  traMADol (ULTRAM) 50 MG  tablet Take 50 mg by mouth every 6 (six) hours as needed.  09/25/11  Yes Historical Provider, MD  warfarin (COUMADIN) 2.5 MG tablet Take 2.5 mg by mouth as directed. Takes 1.25 mg on Monday, Wednesday, Friday. Takes 2.5 mg on Tuesday, Thursday, Saturday, and Sunday. 06/15/11  Yes Jonelle Sidle, MD  metroNIDAZOLE (FLAGYL) 250 MG tablet Take 250 mg by mouth 3 (three) times daily.  10/27/11   Historical Provider, MD    Allergies as of 11/14/2011 - Review Complete 11/14/2011  Allergen Reaction Noted  . Ciprofloxacin Nausea And Vomiting 06/07/2011  . Sulfonamide derivatives Nausea And Vomiting and Rash 10/23/2007    Family History  Problem Relation Age of Onset  . Stroke Mother   . Heart attack Father   . Breast cancer Sister   . Colon cancer Neg Hx     History   Social History  . Marital Status: Single    Spouse Name: N/A    Number of Children: N/A  . Years of Education: N/A   Occupational History  . RETIRED    Social History Main Topics  . Smoking status: Never Smoker   . Smokeless tobacco: Never Used  . Alcohol Use: No  . Drug Use: No  . Sexually Active: Not on file   Other Topics Concern  . Not on file   Social History Narrative  . No narrative on file    Review of Systems: See HPI, otherwise negative ROS  Physical Exam: BP 115/58  Pulse 80  Temp 98.4 F (36.9 C) (Temporal)  Ht 5\' 3"  (1.6 m)  Wt 151 lb 12.8 oz (68.856 kg)  BMI 26.89 kg/m2 General:   Alert,  Well-developed, well-nourished, pleasant and cooperative in NAD Skin:  Intact without significant lesions or rashes. Eyes:  Sclera clear, no icterus.   Conjunctiva pink. Ears:  Normal auditory acuity. Nose:  No deformity, discharge,  or lesions. Mouth:  No deformity or lesions. Neck:  Supple; no masses or thyromegaly. No significant cervical adenopathy. Lungs:  Clear throughout to auscultation.   No wheezes, crackles, or rhonchi. No acute distress. Heart:  Regular rate and rhythm; no murmurs, clicks,  rubs,  or gallops. Abdomen: Non-distended, normal bowel sounds. No bruits. Soft with very mild vague diffuse abdominal tenderness to deep palpation. No obvious mass or organomegaly. Certainly no rebound tenderness or guarding. Pulses:  Normal pulses noted. Extremities:  Without clubbing or edema.   Impression/Plan:  Vague abdominal discomfort in the setting of progressive constipation / chronic narcotic therapy/diltiazem. It sounds as though bowel function has slowed down and this may have more to do with her abdominal bloating and discomfort than anything else. I do not detect a surgical process at this time. She does not appear acutely ill or toxic. Has had no symptoms at all consistent with diarrhea. Gallbladder  is out. Symptoms will be atypical for mesenteric ischemia. Does not sound like acid peptic disease.   Recommendations: Continue Colace 100 mg orally twice daily. No more metronidazole. Add MiraLax 17 g orally nightly to her regimen for the next week. Continue probiotic therapy. Patient is to let me know on September 30 how she is doing and we will go from there. If her symptoms aren't significantly ameliorated with the above regimen at that time , we will embark on further diagnostic studies. If she gets worse in the interim, she is to let me know.

## 2011-11-14 NOTE — Patient Instructions (Signed)
Miralax 17 grams orally at bedtime  Colace 100 mg twice daily; stop metronitizole  Call me with a progress report on September 30th; If your abdominal symptoms don't improve wiith the above, then further evaluation may be needed.

## 2011-11-20 ENCOUNTER — Telehealth: Payer: Self-pay

## 2011-11-20 NOTE — Telephone Encounter (Signed)
Pt called with progress report- she is having a lot of pain in her rectum. She said she had hemorrhoid surgery when she was 78 years old and had some scar tissue removed about 9 years ago and she wants to know if that could be causing her problems. She does have some nausea, but no fever and no blood in stool and is not constipated. Last bm was this morning and it was normal but it hurt a lot when coming out.   She wants to know if there are any other tests that can be done to see if it is hemorrhoids again or more scar tissue. I advised pt that if she got worse overnight or if she develops a fever, vomiting, SOB, she should go to ED.   Please advise.

## 2011-11-21 NOTE — Telephone Encounter (Signed)
She is presenting a new problem to [place] now; needs ov w extender to evaluate

## 2011-11-21 NOTE — Telephone Encounter (Signed)
Pt has an appointment on Thursday to see KJ at 9:30

## 2011-11-23 ENCOUNTER — Encounter: Payer: Self-pay | Admitting: Urgent Care

## 2011-11-23 ENCOUNTER — Ambulatory Visit (INDEPENDENT_AMBULATORY_CARE_PROVIDER_SITE_OTHER): Payer: Medicare Other | Admitting: Urgent Care

## 2011-11-23 VITALS — BP 109/69 | HR 80 | Temp 98.0°F | Ht 63.0 in | Wt 147.4 lb

## 2011-11-23 DIAGNOSIS — K5900 Constipation, unspecified: Secondary | ICD-10-CM

## 2011-11-23 DIAGNOSIS — R109 Unspecified abdominal pain: Secondary | ICD-10-CM

## 2011-11-23 LAB — COMPREHENSIVE METABOLIC PANEL
ALT: 16 U/L (ref 0–35)
AST: 28 U/L (ref 0–37)
Albumin: 4.5 g/dL (ref 3.5–5.2)
BUN: 28 mg/dL — ABNORMAL HIGH (ref 6–23)
CO2: 27 mEq/L (ref 19–32)
Calcium: 9.7 mg/dL (ref 8.4–10.5)
Chloride: 91 mEq/L — ABNORMAL LOW (ref 96–112)
Potassium: 3.6 mEq/L (ref 3.5–5.3)

## 2011-11-23 LAB — CBC WITH DIFFERENTIAL/PLATELET
Basophils Absolute: 0 10*3/uL (ref 0.0–0.1)
Basophils Relative: 0 % (ref 0–1)
MCHC: 34.4 g/dL (ref 30.0–36.0)
Neutro Abs: 4.3 10*3/uL (ref 1.7–7.7)
Neutrophils Relative %: 52 % (ref 43–77)
RDW: 14.3 % (ref 11.5–15.5)

## 2011-11-23 LAB — LIPASE: Lipase: 25 U/L (ref 0–75)

## 2011-11-23 NOTE — Patient Instructions (Addendum)
STOP Restora BEGIN ALIGN daily Continue colace twice daily & miralax at bedtime if needed We will call with lab & CT results To ER if severe pain or unable to keep down fluids You have Lortab you may use for pain

## 2011-11-23 NOTE — Progress Notes (Signed)
Primary Care Physician:  Cassell Smiles., MD Primary Gastroenterologist:  Dr. Jena Gauss  HPI:  [name] is a 78 y.o. female here for evaluation of abdominal and rectal pain.  She has history of Hemorrhoids from childhood. She is status post hemorrhoidectomy by Dr. Lovell Sheehan many years ago. She is a chronic counter since that time. Today she reports her "bowels ache all the time".  C/o chronic constipation & abdominal bloating.  She tells me she can't eat.  She has had vomiting 3 days ago.  C/o lower abd pain that is constant.  She also states that "If it weren't for pain meds, I coulnd't go".  She is taking Colace BID and Miralax hs prn.  C/o dark stools, but denies rectal bleeding or melena.  She takes Lortab every 4 hrs for abdominal pain.  She is on diltiazem therapy. She is followed by cardiologist and tells me her valvular/congestive heart disease has worsened. She has no appetite. Her weight has remained stable. Denies fever, chills, heartburn or indigestion.  Past Medical History  Diagnosis Date  . Rheumatic heart disease   . IBS (irritable bowel syndrome)   . GERD (gastroesophageal reflux disease)   . Breast cancer   . Mitral regurgitation   . Mitral stenosis   . Atrial fibrillation   . PAT (paroxysmal atrial tachycardia)   . Hypothyroidism   . Melanoma of neck   . Microscopic colitis 2003    Was maintained on Asacol BID  . S/P colonoscopy April 2010    Friable anal canal, left-sided diverticula, adenomatous polyp  . S/P endoscopy April 2010    Benign polyp, s/p 54-F Maloney dilation  . Hiatal hernia   . Diverticula of colon   . Tubulovillous adenoma   . Adenomatous polyp     Past Surgical History  Procedure Date  . Cardiac catheterization     NORMAL CORONARY ARTERIES AT CATH IN 2006  . Abdominal hysterectomy   . Mastectomy     RIGHT BREAST, followed by left breast after radiation  . Melanoma removal     SURGERY NECK  . Cataracts   . Hemorrhoid surgery   .  Tonsillectomy   . Colonoscopy 06/12/2008    Dr. Audelia Hives anal canal, left-sided diverticula, adenomatous polyp  . Esophagogastroduodenoscopy 06/12/2008    Dr. Larina Bras polyp, s/p 54-F Elease Hashimoto dilation  . Colonoscopy 09/28/2011    Dr. Jena Gauss- tubular adenoma, colonic diverticulitis   Current Outpatient Prescriptions  Medication Sig Dispense Refill  . ALPRAZolam (XANAX) 0.5 MG tablet Take 0.5 mg by mouth at bedtime as needed.       . chlorthalidone (HYGROTON) 25 MG tablet Take 25 mg by mouth daily.        Marland Kitchen COUMADIN 2.5 MG tablet TAKE 1 TABLET BY MOUTH DAILY EXCEPT 1/2 TABLET ON MONDAY,WEDNESDAY,& FRIDAY.  30 tablet  4  . dicyclomine (BENTYL) 10 MG capsule Take 10 mg by mouth daily as needed.       . diltiazem (CARDIZEM CD) 300 MG 24 hr capsule Take 1 capsule (300 mg total) by mouth daily.  30 capsule  6  . furosemide (LASIX) 20 MG tablet Take 20 mg by mouth as needed. Swelling      . HYDROcodone-acetaminophen (LORTAB) 10-500 MG per tablet Take 1 tablet by mouth every 6 (six) hours as needed.        . hydrocortisone (CORTEF) 10 MG tablet Take 10 mg by mouth daily.      Marland Kitchen levothyroxine (SYNTHROID, LEVOTHROID) 125 MCG  tablet Take 125 mcg by mouth daily.      . pantoprazole (PROTONIX) 40 MG tablet Take 40 mg by mouth 2 (two) times daily.        . potassium chloride (K-DUR,KLOR-CON) 10 MEQ tablet Take 1 tablet by mouth Daily.      . potassium chloride (KLOR-CON) 10 MEQ CR tablet Take 10 mEq by mouth 3 (three) times daily.       . Probiotic Product (RESTORA) CAPS Take by mouth daily.      . propranolol (INDERAL) 80 MG tablet Take 80 mg by mouth daily.          Allergies as of 11/23/2011 - Review Complete 11/23/2011  Allergen Reaction Noted  . Ciprofloxacin Nausea And Vomiting 06/07/2011  . Sulfonamide derivatives Nausea And Vomiting and Rash 10/23/2007    Family History  Problem Relation Age of Onset  . Stroke Mother   . Heart attack Father   . Breast cancer Sister   . Colon cancer  Neg Hx     History   Social History  . Marital Status: Single    Spouse Name: N/A    Number of Children: N/A  . Years of Education: N/A   Occupational History  . RETIRED    Social History Main Topics  . Smoking status: Never Smoker   . Smokeless tobacco: Never Used  . Alcohol Use: No  . Drug Use: No  . Sexually Active: Not on file   Review of Systems: See HPI, otherwise negative ROS  Physical Exam: BP 109/69  Pulse 80  Temp 98 F (36.7 C) (Temporal)  Ht 5\' 3"  (1.6 m)  Wt 147 lb 6.4 oz (66.86 kg)  BMI 26.11 kg/m2 General:   Alert,  Well-developed, well-nourished, pleasant and cooperative in NAD Skin:  Intact without significant lesions or rashes. Eyes:  Sclera clear, no icterus.   Conjunctiva pink. Ears:  Normal auditory acuity. Nose:  No deformity, discharge,  or lesions. Mouth:  No deformity or lesions. Neck:  Supple; no masses or thyromegaly. No significant cervical adenopathy. Lungs:  Clear throughout to auscultation.   No wheezes, crackles, or rhonchi. No acute distress. Heart:  Regular rate and rhythm; no murmurs, clicks, rubs,  or gallops. Abdomen: + BS x4.  Mildly distended. Soft. Tenderness to lower abdomen around the umbilicus. No rebound tenderness or guarding.  Extremities:  No lower extremity edema.

## 2011-11-23 NOTE — Progress Notes (Signed)
Faxed to PCP

## 2011-11-23 NOTE — Assessment & Plan Note (Signed)
See HPI

## 2011-11-23 NOTE — Assessment & Plan Note (Addendum)
[name] is a pleasant 78 y.o. female with chronic constipation and chronic abdominal pain. Pain persists and has actually worsened despite adequate bowel evacuation.  Patient is very concerned that she has a "bowel obstruction", however I doubt this is the case at this time. She does have lower abdominal tenderness as well as umbilical tenderness.  CT scan to rule out diverticulitis, pancreatitis, and mesenteric ischemia due to low flow state remains in the differential as well.  Cannot r/o functional component & pain due to constipation.  UA to r/o UTI.  STOP Restora BEGIN ALIGN daily Continue colace twice daily & miralax at bedtime if needed CBC, CMP, lipase, UA To ER if severe pain or unable to keep down fluids You have Lortab you may use for pain

## 2011-11-24 LAB — URINALYSIS W MICROSCOPIC + REFLEX CULTURE
Bacteria, UA: NONE SEEN
Crystals: NONE SEEN
Ketones, ur: NEGATIVE mg/dL
Leukocytes, UA: NEGATIVE
Nitrite: NEGATIVE
Specific Gravity, Urine: 1.012 (ref 1.005–1.030)
Urobilinogen, UA: 0.2 mg/dL (ref 0.0–1.0)

## 2011-11-24 NOTE — Progress Notes (Signed)
Quick Note:  Await CT. ______ 

## 2011-11-30 ENCOUNTER — Ambulatory Visit (HOSPITAL_COMMUNITY)
Admission: RE | Admit: 2011-11-30 | Discharge: 2011-11-30 | Disposition: A | Discharge status: Home / Self Care | Payer: Medicare Other | Source: Ambulatory Visit | Attending: Urgent Care | Admitting: Urgent Care

## 2011-11-30 DIAGNOSIS — R1031 Right lower quadrant pain: Secondary | ICD-10-CM | POA: Insufficient documentation

## 2011-11-30 MED ORDER — IOHEXOL 300 MG/ML  SOLN
100.0000 mL | Freq: Once | INTRAMUSCULAR | Status: AC | PRN
  Administered 2011-11-30: 100 mL via INTRAVENOUS

## 2011-12-01 NOTE — Progress Notes (Addendum)
Quick Note:  Please call pt. Her CT scan is normal. Her labs show 1 very mildly elevated liver test.  Kidney function is a bit high.  Please have pt call Dr Sherwood Gambler about this- He may want to change her fluid pills. Please see if BILI can be fractionated on blood already in lab. If not, let me know. Start Linzess daily for constipation, #30, 1 RF. Office visit in 6 weeks FU abd pain, constipation Thanks [id number] J., MD  ______

## 2011-12-05 ENCOUNTER — Other Ambulatory Visit: Payer: Self-pay | Admitting: Urgent Care

## 2011-12-05 NOTE — Progress Notes (Signed)
Quick Note:  Pt aware, rx called to [place] at [facility]. Per KJ- if pt is not having any pain right now she needs LFT's in 1 month. Pt stated she was doing ok. Lab order on file.   Darl Pikes, please schedule appt for 6 weeks. ______

## 2011-12-06 ENCOUNTER — Telehealth: Payer: Self-pay

## 2011-12-06 ENCOUNTER — Other Ambulatory Visit: Payer: Self-pay

## 2011-12-06 MED ORDER — LUBIPROSTONE 8 MCG PO CAPS: 8.0000 ug | ORAL_CAPSULE | Freq: Two times a day (BID) | ORAL | Status: DC

## 2011-12-06 NOTE — Telephone Encounter (Signed)
Pt is aware.  

## 2011-12-06 NOTE — Telephone Encounter (Signed)
Sure, let's try Amitiza WITH FOOD BID for constipation

## 2011-12-06 NOTE — Telephone Encounter (Signed)
Pt came by office this morning and stated her insurance does not cover linzess. Gave pt #3 boxes of samples of linzess . Received paper from [employer] , pts insurance prefers Amitiza. Pt has tried colace bid and miralax. Do you want to change to Amitiza?

## 2011-12-06 NOTE — Progress Notes (Signed)
Faxed to PCP

## 2011-12-13 ENCOUNTER — Encounter: Payer: Self-pay | Admitting: *Deleted

## 2011-12-18 ENCOUNTER — Telehealth: Payer: Self-pay | Admitting: Cardiology

## 2011-12-18 NOTE — Telephone Encounter (Signed)
Patient wants to speak with you regarding the letter she received about not keeping her appointments. / tg

## 2011-12-21 NOTE — Telephone Encounter (Signed)
Pt refused to come in for INR check before OV appt with Dr Diona Browner on 11/22.  Explained risks to pt of not keeping coumadin appt.  She verbalized understanding but states she has been sick and in the bed for 2 wks.

## 2011-12-25 ENCOUNTER — Telehealth: Payer: Self-pay

## 2011-12-25 NOTE — Telephone Encounter (Signed)
Spoke with pt- she is not having any type of SOB or dizziness or N/V that she contributes to the medications. I will start working on PA, left 2 boxes of Linzess at the front desk.

## 2011-12-25 NOTE — Telephone Encounter (Signed)
Agree 

## 2011-12-25 NOTE — Telephone Encounter (Signed)
Pt called- left voicemail- she has tried the [place] and has noticed increased swelling in her legs and feet and has broke out in a rash and has been itchy. Pt stated the linzess 145 mcg worked better for her, but pt needs a PA done for it. Will work on PA for pt, tried to call pt to let her know and the number was busy.

## 2011-12-29 LAB — HEPATIC FUNCTION PANEL
ALT: 12 U/L (ref 0–35)
Bilirubin, Direct: 0.1 mg/dL (ref 0.0–0.3)
Total Bilirubin: 0.6 mg/dL (ref 0.3–1.2)

## 2012-01-02 MED ORDER — LINACLOTIDE 145 MCG PO CAPS: 145.0000 ug | ORAL_CAPSULE | Freq: Every day | ORAL | Status: DC

## 2012-01-02 NOTE — Telephone Encounter (Signed)
Working on [place] for pts medication.

## 2012-01-03 NOTE — Progress Notes (Signed)
Quick Note:  Pt aware.  Dawn, please cc Dr. Sherwood Gambler ______

## 2012-01-03 NOTE — Progress Notes (Signed)
Faxed to PCP

## 2012-01-03 NOTE — Progress Notes (Signed)
Quick Note:  Please call pt & let her know her liver function tests are normal. [id number] J., MD  ______

## 2012-01-08 NOTE — Telephone Encounter (Signed)
PA was approved and faxed to pharmacy

## 2012-01-10 ENCOUNTER — Ambulatory Visit (HOSPITAL_COMMUNITY): Admission: RE | Admit: 2012-01-10 | Payer: Medicare Other | Source: Ambulatory Visit

## 2012-01-10 ENCOUNTER — Ambulatory Visit (HOSPITAL_COMMUNITY)
Admission: RE | Admit: 2012-01-10 | Discharge: 2012-01-10 | Disposition: A | Discharge status: Home / Self Care | Payer: Medicare Other | Source: Ambulatory Visit | Attending: Urgent Care | Admitting: Urgent Care

## 2012-01-10 ENCOUNTER — Ambulatory Visit (INDEPENDENT_AMBULATORY_CARE_PROVIDER_SITE_OTHER): Payer: Medicare Other | Admitting: Urgent Care

## 2012-01-10 ENCOUNTER — Encounter: Payer: Self-pay | Admitting: Urgent Care

## 2012-01-10 VITALS — BP 131/73 | HR 83 | Temp 97.2°F | Ht 61.0 in | Wt 150.4 lb

## 2012-01-10 DIAGNOSIS — R600 Localized edema: Secondary | ICD-10-CM

## 2012-01-10 DIAGNOSIS — R609 Edema, unspecified: Secondary | ICD-10-CM

## 2012-01-10 DIAGNOSIS — M79669 Pain in unspecified lower leg: Secondary | ICD-10-CM

## 2012-01-10 DIAGNOSIS — M79609 Pain in unspecified limb: Secondary | ICD-10-CM | POA: Insufficient documentation

## 2012-01-10 DIAGNOSIS — R109 Unspecified abdominal pain: Secondary | ICD-10-CM

## 2012-01-10 DIAGNOSIS — K5900 Constipation, unspecified: Secondary | ICD-10-CM

## 2012-01-10 DIAGNOSIS — I82409 Acute embolism and thrombosis of unspecified deep veins of unspecified lower extremity: Secondary | ICD-10-CM

## 2012-01-10 NOTE — Patient Instructions (Addendum)
Go directly to radiology for doppler ultrasound of your legs.  We will call you with results.   Be sure to follow up with Dr Sherwood Gambler about your abnormal kidney (creatinine) function on last labs. Continue Linzess daily for constipation.

## 2012-01-10 NOTE — Progress Notes (Signed)
Quick Note:  Pt aware, Dawn, please cc pcp ______

## 2012-01-10 NOTE — Assessment & Plan Note (Addendum)
Left greater than right lower extremity edema along with calf pain and erythema of left calf.   Stat Doppler ultrasound for bilateral lower extremities was negative today She should follow with her cardiologist as planned.

## 2012-01-10 NOTE — Progress Notes (Signed)
Primary Care Physician:  Cassell Smiles., MD Primary Gastroenterologist:  Dr. Jena Gauss  HPI:  [name] is a 78 y.o. female here for followup constipation & abdominal pain.  When she was seen by me approximately 6 weeks ago, she was having severe abdominal pain and constipation. She underwent a CT scan of abdomen and pelvis with IV contrast that was normal.  She tells me her abdominal pain has resolved. She denies any nausea or vomiting. Her constipation is much improved on Linzess daily.  She believes she had a reaction to Amitiza. This has been discontinued. She started to have swelling and itching in both legs and noticed a red rash on her left leg. She has been given antibiotics per her primary care for this. She complains of left calf pain and bilateral lower extremity edema. She says she has some edema with her history of congestive heart failure but this seems worse. Pain is worse with walking. She is on chronic Coumadin.  She takes Lortab every 4 hrs for abdominal pain.  She is on diltiazem therapy with a recent dosage change. She is followed by cardiologist and tells me her valvular/congestive heart disease has worsened.  She has followup on Friday at [facility] cardiology.  Her acid reflux is well controlled at this time. She is taking Protonix 40 mg twice a day.  Past Medical History  Diagnosis Date  . Rheumatic heart disease   . IBS (irritable bowel syndrome)   . GERD (gastroesophageal reflux disease)   . Breast cancer   . Mitral regurgitation   . Mitral stenosis   . Atrial fibrillation   . PAT (paroxysmal atrial tachycardia)   . Hypothyroidism   . Melanoma of neck   . Microscopic colitis 2003    Was maintained on Asacol BID  . S/P colonoscopy April 2010    Friable anal canal, left-sided diverticula, adenomatous polyp  . S/P endoscopy April 2010    Benign polyp, s/p 54-F Maloney dilation  . Hiatal hernia   . Diverticula of colon   . Tubulovillous adenoma   . Adenomatous  polyp     Past Surgical History  Procedure Date  . Cardiac catheterization     NORMAL CORONARY ARTERIES AT CATH IN 2006  . Abdominal hysterectomy   . Mastectomy     RIGHT BREAST, followed by left breast after radiation  . Melanoma removal     SURGERY NECK  . Cataracts   . Hemorrhoid surgery   . Tonsillectomy   . Colonoscopy 06/12/2008    Dr. Audelia Hives anal canal, left-sided diverticula, adenomatous polyp  . Esophagogastroduodenoscopy 06/12/2008    Dr. Larina Bras polyp, s/p 54-F Elease Hashimoto dilation  . Colonoscopy 09/28/2011    Dr. Jena Gauss- tubular adenoma, colonic diverticulitis   Current Outpatient Prescriptions  Medication Sig Dispense Refill  . ALPRAZolam (XANAX) 0.5 MG tablet Take 0.5 mg by mouth at bedtime as needed.       . chlorthalidone (HYGROTON) 25 MG tablet Take 25 mg by mouth daily.        Marland Kitchen COUMADIN 2.5 MG tablet TAKE 1 TABLET BY MOUTH DAILY EXCEPT 1/2 TABLET ON MONDAY,WEDNESDAY,& FRIDAY.  30 tablet  4  . dicyclomine (BENTYL) 10 MG capsule Take 10 mg by mouth daily as needed.       . diltiazem (CARDIZEM CD) 300 MG 24 hr capsule Take 1 capsule (300 mg total) by mouth daily.  30 capsule  6  . furosemide (LASIX) 20 MG tablet Take 20 mg by mouth  as needed. Swelling      . HYDROcodone-acetaminophen (LORTAB) 10-500 MG per tablet Take 1 tablet by mouth every 6 (six) hours as needed.        . hydrocortisone (CORTEF) 10 MG tablet Take 10 mg by mouth daily.      Marland Kitchen levothyroxine (SYNTHROID, LEVOTHROID) 125 MCG tablet Take 125 mcg by mouth daily.      . Linaclotide (LINZESS) 145 MCG CAPS Take 1 capsule (145 mcg total) by mouth daily.  30 capsule  1  . pantoprazole (PROTONIX) 40 MG tablet Take 40 mg by mouth 2 (two) times daily.        . potassium chloride (K-DUR,KLOR-CON) 10 MEQ tablet Take 1 tablet by mouth Daily.      . potassium chloride (KLOR-CON) 10 MEQ CR tablet Take 10 mEq by mouth 3 (three) times daily.       . Probiotic Product (RESTORA) CAPS Take by mouth daily.      .  propranolol (INDERAL) 80 MG tablet Take 80 mg by mouth daily.          Allergies as of 01/10/2012 - Review Complete 01/10/2012  Allergen Reaction Noted  . Amitiza (lubiprostone) Swelling 01/10/2012  . Ciprofloxacin Nausea And Vomiting 06/07/2011  . Sulfonamide derivatives Nausea And Vomiting and Rash 10/23/2007   Review of Systems: See HPI, otherwise negative ROS  Physical Exam: BP 131/73  Pulse 83  Temp 97.2 F (36.2 C) (Temporal)  Ht 5\' 1"  (1.549 m)  Wt 150 lb 6.4 oz (68.221 kg)  BMI 28.42 kg/m2 No LMP recorded. Patient has had a hysterectomy. General:   Alert,  Well-developed, well-nourished, pleasant and cooperative in NAD Head:  Normocephalic and atraumatic. Eyes:  Sclera clear, no icterus.   Conjunctiva pink. Mouth:  No deformity or lesions.  Oropharynx pink & moist. Neck:  Supple; no masses or thyromegaly. Heart:  Regular rate and rhythm; no murmurs, clicks, rubs,  or gallops. Abdomen:   Normal bowel sounds.  Soft, nontender and nondistended. No masses, hepatosplenomegaly or hernias noted. No guarding or rebound tenderness.   Msk:  Symmetrical without gross deformities. Normal posture. Pulses:  Normal pulses noted. Extremities:  2+ pitting left pretibial/LE to knee edema, 1+ pitting on the right. She has warmth & mild erythema just above the medial malleolus on the left calf. She has tenderness to her left calf. She is drying scaling skin on both calves. Neurologic:  Alert and  oriented x4;  grossly normal neurologically. Skin:  Intact without significant lesions or rashes. Cervical Nodes:  No significant cervical adenopathy. Psych:  Alert and cooperative. Normal mood and affect.

## 2012-01-10 NOTE — Progress Notes (Signed)
Results faxed to PCP 

## 2012-01-10 NOTE — Progress Notes (Signed)
Quick Note:  Please call pt. No blood clots in legs just swelling. She should keep appt as planned w/ cardiologist Friday Thanks Cc: Cassell Smiles., MD  ______

## 2012-01-10 NOTE — Assessment & Plan Note (Addendum)
CT scan reassuring. Recent LFTs normal. She does have history of Gilbert's. I suspect her chronic abdominal pain is caused by chronic constipation worsened with narcotics. She has had good results with Linzess & should continue this regimen.

## 2012-01-10 NOTE — Assessment & Plan Note (Addendum)
Chronic constipation.  She is doing well on linzess daily.    Continue Linzess daily for constipation.  She is advised to follow up with Dr Sherwood Gambler about abnormal creatinine on last lab

## 2012-01-11 NOTE — Progress Notes (Signed)
Faxed to PCP

## 2012-01-12 ENCOUNTER — Encounter: Payer: Self-pay | Admitting: Cardiology

## 2012-01-12 ENCOUNTER — Ambulatory Visit (INDEPENDENT_AMBULATORY_CARE_PROVIDER_SITE_OTHER): Payer: Medicaid Other | Admitting: Cardiology

## 2012-01-12 VITALS — BP 129/85 | HR 86 | Ht 63.0 in | Wt 149.0 lb

## 2012-01-12 DIAGNOSIS — I4891 Unspecified atrial fibrillation: Secondary | ICD-10-CM

## 2012-01-12 DIAGNOSIS — Z7901 Long term (current) use of anticoagulants: Secondary | ICD-10-CM

## 2012-01-12 DIAGNOSIS — I050 Rheumatic mitral stenosis: Secondary | ICD-10-CM

## 2012-01-12 LAB — POCT INR: INR: 2.9

## 2012-01-12 MED ORDER — DILTIAZEM HCL ER COATED BEADS 300 MG PO CP24: 300.0000 mg | ORAL_CAPSULE | Freq: Every day | ORAL | Status: DC

## 2012-01-12 NOTE — Patient Instructions (Addendum)
Your physician recommends that you schedule a follow-up appointment in: FOLLOW UP 6 MONTHS WITH SM  Your physician recommends that you schedule a follow-up appointment in: FOLLOW UP ONE MONTH WITH [facility] FOR COUMADIN CLINIC  Your physician recommends that you continue on your current medications as directed. Please refer to the Current Medication list given to you today, per today PT/INR reading today in office

## 2012-01-12 NOTE — Assessment & Plan Note (Signed)
Permanent, stable symptomatically on current dose of Cardizem CD. PT/INR was 2.9 today on Coumadin. Importance of regular followup in the Coumadin clinic was reinforced, and a visit was made. Otherwise refill for Cardizem CD 300 mg daily, 3 month supply. Follow up arranged.

## 2012-01-12 NOTE — Assessment & Plan Note (Signed)
Overall mild by her last echocardiogram. Continue symptomatic followup for now.

## 2012-01-12 NOTE — Progress Notes (Signed)
Clinical Summary Ms. Honeycutt is an 78 y.o.female presenting for followup. She was seen in August. She reports good control of palpitations on current dose of Cardizem CD. She has not been compliant with followup in the Coumadin clinic, continues to take her Coumadin at the previous dose recommended in August. This was addressed with her today. She denies any significant bleeding episodes. PT/INR checked today was 2.9.  Otherwise she reports stable dyspnea on exertion, syncope.   Allergies  Allergen Reactions  . Amitiza (Lubiprostone) Swelling  . Ciprofloxacin Nausea And Vomiting  . Sulfonamide Derivatives Nausea And Vomiting and Rash    Current Outpatient Prescriptions  Medication Sig Dispense Refill  . ALPRAZolam (XANAX) 0.5 MG tablet Take 0.5 mg by mouth at bedtime as needed.       . chlorthalidone (HYGROTON) 25 MG tablet Take 25 mg by mouth daily.        . clindamycin (CLEOCIN) 150 MG capsule Take 150 mg by mouth 4 (four) times daily. Seven days only      . COUMADIN 2.5 MG tablet TAKE 1 TABLET BY MOUTH DAILY EXCEPT 1/2 TABLET ON MONDAY,WEDNESDAY,& FRIDAY.  30 tablet  4  . dicyclomine (BENTYL) 10 MG capsule Take 10 mg by mouth daily as needed.       . diltiazem (CARDIZEM CD) 300 MG 24 hr capsule Take 1 capsule (300 mg total) by mouth daily.  30 capsule  6  . furosemide (LASIX) 20 MG tablet Take 20 mg by mouth as needed. Swelling      . HYDROcodone-acetaminophen (LORTAB) 10-500 MG per tablet Take 1 tablet by mouth every 6 (six) hours as needed.        . hydrocortisone (CORTEF) 10 MG tablet Take 10 mg by mouth daily.      Marland Kitchen levothyroxine (SYNTHROID, LEVOTHROID) 125 MCG tablet Take 125 mcg by mouth daily.      . Linaclotide (LINZESS) 145 MCG CAPS Take 1 capsule (145 mcg total) by mouth daily.  30 capsule  1  . pantoprazole (PROTONIX) 40 MG tablet Take 40 mg by mouth 2 (two) times daily.        . potassium chloride (K-DUR,KLOR-CON) 10 MEQ tablet Take 1 tablet by mouth Daily.      .  potassium chloride (KLOR-CON) 10 MEQ CR tablet Take 10 mEq by mouth 3 (three) times daily.       . Probiotic Product (RESTORA) CAPS Take by mouth daily.      . propranolol (INDERAL) 80 MG tablet Take 80 mg by mouth daily.          Past Medical History  Diagnosis Date  . Rheumatic heart disease   . IBS (irritable bowel syndrome)   . GERD (gastroesophageal reflux disease)   . Breast cancer   . Mitral regurgitation   . Mitral stenosis   . Atrial fibrillation   . PAT (paroxysmal atrial tachycardia)   . Hypothyroidism   . Melanoma of neck   . Microscopic colitis 2003    Was maintained on Asacol BID  . S/P colonoscopy April 2010    Friable anal canal, left-sided diverticula, adenomatous polyp  . S/P endoscopy April 2010    Benign polyp, s/p 54-F Maloney dilation  . Hiatal hernia   . Diverticula of colon   . Tubulovillous adenoma   . Adenomatous polyp     Social History Ms. Edge reports that she has never smoked. She has never used smokeless tobacco. Ms. Popoca reports that she does not drink  alcohol.  Review of Systems Reports a reaction to one of her new GI medications, a generic. No exertional chest pain. No fevers or chills. Stable appetite.  Physical Examination Filed Vitals:   01/12/12 1258  BP: 129/85  Pulse: 86   Filed Weights   01/12/12 1258  Weight: 149 lb 0.6 oz (67.604 kg)    Elderly woman in no acute distress.  HEENT: Conjunctiva and lids are normal, oropharynx clear.  Neck: Supple, no elevated jugular venous pressure, no carotid bruits. No thyromegaly.  Lungs: Diminished breath sounds, no rales, nonlabored.  Cardiac: Irregularly irregular, 2/6 apical systolic murmur, no S3 gallop, no pericardial rub.  Abdomen: Soft, nontender, bowel sounds present.  Extremities: 1+ edema left greater than right, distal pulses one plus.  Neuropsychiatric: Alert oriented x3, affect appropriate.  Skin: Warm and dry.  Musculoskeletal: Kyphosis noted.   Problem List and  Plan   Atrial fibrillation Permanent, stable symptomatically on current dose of Cardizem CD. PT/INR was 2.9 today on Coumadin. Importance of regular followup in the Coumadin clinic was reinforced, and a visit was made. Otherwise refill for Cardizem CD 300 mg daily, 3 month supply. Follow up arranged.  Mitral stenosis Overall mild by her last echocardiogram. Continue symptomatic followup for now.    Jonelle Sidle, M.D., F.A.C.C.

## 2012-01-12 NOTE — Addendum Note (Signed)
Addended by: Reather Laurence A on: 01/12/2012 02:20 PM   Modules accepted: Orders

## 2012-01-12 NOTE — Addendum Note (Signed)
Addended by: Derry Lory A on: 01/12/2012 01:33 PM   Modules accepted: Orders

## 2012-02-26 ENCOUNTER — Ambulatory Visit (INDEPENDENT_AMBULATORY_CARE_PROVIDER_SITE_OTHER): Payer: Medicare Other | Admitting: *Deleted

## 2012-02-26 DIAGNOSIS — I4891 Unspecified atrial fibrillation: Secondary | ICD-10-CM

## 2012-02-26 DIAGNOSIS — Z7901 Long term (current) use of anticoagulants: Secondary | ICD-10-CM

## 2012-02-26 LAB — POCT INR: INR: 3.4

## 2012-02-27 ENCOUNTER — Encounter (HOSPITAL_COMMUNITY): Payer: Self-pay | Admitting: *Deleted

## 2012-02-27 ENCOUNTER — Emergency Department (HOSPITAL_COMMUNITY): Payer: Medicare Other

## 2012-02-27 ENCOUNTER — Inpatient Hospital Stay (HOSPITAL_COMMUNITY)
Admission: EM | Admit: 2012-02-27 | Discharge: 2012-03-01 | DRG: 603 | Disposition: A | Discharge status: Home Health | Payer: Medicare Other | Attending: Internal Medicine | Admitting: Internal Medicine

## 2012-02-27 DIAGNOSIS — R0602 Shortness of breath: Secondary | ICD-10-CM

## 2012-02-27 DIAGNOSIS — Z7901 Long term (current) use of anticoagulants: Secondary | ICD-10-CM

## 2012-02-27 DIAGNOSIS — T368X5A Adverse effect of other systemic antibiotics, initial encounter: Secondary | ICD-10-CM | POA: Diagnosis not present

## 2012-02-27 DIAGNOSIS — R109 Unspecified abdominal pain: Secondary | ICD-10-CM

## 2012-02-27 DIAGNOSIS — I4891 Unspecified atrial fibrillation: Secondary | ICD-10-CM | POA: Diagnosis present

## 2012-02-27 DIAGNOSIS — L03119 Cellulitis of unspecified part of limb: Secondary | ICD-10-CM | POA: Diagnosis present

## 2012-02-27 DIAGNOSIS — Z9010 Acquired absence of unspecified breast and nipple: Secondary | ICD-10-CM

## 2012-02-27 DIAGNOSIS — R600 Localized edema: Secondary | ICD-10-CM

## 2012-02-27 DIAGNOSIS — G8929 Other chronic pain: Secondary | ICD-10-CM

## 2012-02-27 DIAGNOSIS — Z86010 Personal history of colonic polyps: Secondary | ICD-10-CM

## 2012-02-27 DIAGNOSIS — L03116 Cellulitis of left lower limb: Secondary | ICD-10-CM

## 2012-02-27 DIAGNOSIS — M79669 Pain in unspecified lower leg: Secondary | ICD-10-CM

## 2012-02-27 DIAGNOSIS — E039 Hypothyroidism, unspecified: Secondary | ICD-10-CM | POA: Diagnosis present

## 2012-02-27 DIAGNOSIS — K219 Gastro-esophageal reflux disease without esophagitis: Secondary | ICD-10-CM | POA: Diagnosis present

## 2012-02-27 DIAGNOSIS — R300 Dysuria: Secondary | ICD-10-CM | POA: Diagnosis not present

## 2012-02-27 DIAGNOSIS — I050 Rheumatic mitral stenosis: Secondary | ICD-10-CM

## 2012-02-27 DIAGNOSIS — Z85820 Personal history of malignant melanoma of skin: Secondary | ICD-10-CM

## 2012-02-27 DIAGNOSIS — K5900 Constipation, unspecified: Secondary | ICD-10-CM

## 2012-02-27 DIAGNOSIS — I052 Rheumatic mitral stenosis with insufficiency: Secondary | ICD-10-CM | POA: Diagnosis present

## 2012-02-27 DIAGNOSIS — R609 Edema, unspecified: Secondary | ICD-10-CM

## 2012-02-27 DIAGNOSIS — L02419 Cutaneous abscess of limb, unspecified: Principal | ICD-10-CM | POA: Diagnosis present

## 2012-02-27 DIAGNOSIS — M84376G Stress fracture, unspecified foot, subsequent encounter for fracture with delayed healing: Secondary | ICD-10-CM

## 2012-02-27 DIAGNOSIS — Z79899 Other long term (current) drug therapy: Secondary | ICD-10-CM

## 2012-02-27 DIAGNOSIS — Z923 Personal history of irradiation: Secondary | ICD-10-CM

## 2012-02-27 DIAGNOSIS — I059 Rheumatic mitral valve disease, unspecified: Secondary | ICD-10-CM | POA: Diagnosis present

## 2012-02-27 DIAGNOSIS — E876 Hypokalemia: Secondary | ICD-10-CM | POA: Diagnosis present

## 2012-02-27 DIAGNOSIS — K589 Irritable bowel syndrome without diarrhea: Secondary | ICD-10-CM | POA: Diagnosis present

## 2012-02-27 DIAGNOSIS — I099 Rheumatic heart disease, unspecified: Secondary | ICD-10-CM | POA: Diagnosis present

## 2012-02-27 DIAGNOSIS — Z853 Personal history of malignant neoplasm of breast: Secondary | ICD-10-CM

## 2012-02-27 DIAGNOSIS — R062 Wheezing: Secondary | ICD-10-CM | POA: Diagnosis not present

## 2012-02-27 LAB — CBC WITH DIFFERENTIAL/PLATELET
Basophils Absolute: 0 10*3/uL (ref 0.0–0.1)
Basophils Relative: 0 % (ref 0–1)
HCT: 38.7 % (ref 36.0–46.0)
MCHC: 33.6 g/dL (ref 30.0–36.0)
Monocytes Absolute: 0.5 10*3/uL (ref 0.1–1.0)
Neutro Abs: 4.4 10*3/uL (ref 1.7–7.7)
RDW: 14.1 % (ref 11.5–15.5)

## 2012-02-27 LAB — PROTIME-INR
INR: 2.6 — ABNORMAL HIGH (ref 0.00–1.49)
Prothrombin Time: 26.6 seconds — ABNORMAL HIGH (ref 11.6–15.2)

## 2012-02-27 LAB — URINALYSIS, ROUTINE W REFLEX MICROSCOPIC
Glucose, UA: NEGATIVE mg/dL
Ketones, ur: NEGATIVE mg/dL
Leukocytes, UA: NEGATIVE
Nitrite: NEGATIVE
Protein, ur: NEGATIVE mg/dL
Urobilinogen, UA: 0.2 mg/dL (ref 0.0–1.0)

## 2012-02-27 LAB — COMPREHENSIVE METABOLIC PANEL
AST: 25 U/L (ref 0–37)
Albumin: 3.5 g/dL (ref 3.5–5.2)
Calcium: 9.4 mg/dL (ref 8.4–10.5)
Chloride: 93 mEq/L — ABNORMAL LOW (ref 96–112)
Creatinine, Ser: 0.84 mg/dL (ref 0.50–1.10)

## 2012-02-27 LAB — TROPONIN I: Troponin I: <0.3 ng/mL (ref <0.30)

## 2012-02-27 MED ORDER — ALPRAZOLAM 0.5 MG PO TABS
0.5000 mg | ORAL_TABLET | Freq: Every evening | ORAL | Status: DC | PRN
  Administered 2012-02-27 – 2012-02-29 (×3): 0.5 mg via ORAL
  Filled 2012-02-27 (×3): qty 1

## 2012-02-27 MED ORDER — DICYCLOMINE HCL 10 MG PO CAPS: 10.0000 mg | ORAL_CAPSULE | Freq: Every day | ORAL | Status: DC | PRN

## 2012-02-27 MED ORDER — POTASSIUM CHLORIDE CRYS ER 20 MEQ PO TBCR
40.0000 meq | EXTENDED_RELEASE_TABLET | Freq: Once | ORAL | Status: AC
  Administered 2012-02-27: 40 meq via ORAL
  Filled 2012-02-27: qty 2

## 2012-02-27 MED ORDER — PROPRANOLOL HCL 20 MG PO TABS
80.0000 mg | ORAL_TABLET | Freq: Every day | ORAL | Status: DC
  Administered 2012-02-27 – 2012-02-28 (×2): 80 mg via ORAL
  Filled 2012-02-27 (×2): qty 4

## 2012-02-27 MED ORDER — PANTOPRAZOLE SODIUM 40 MG PO TBEC
40.0000 mg | DELAYED_RELEASE_TABLET | Freq: Two times a day (BID) | ORAL | Status: DC
  Administered 2012-02-27 – 2012-03-01 (×6): 40 mg via ORAL
  Filled 2012-02-27 (×6): qty 1

## 2012-02-27 MED ORDER — ONDANSETRON HCL 4 MG PO TABS
4.0000 mg | ORAL_TABLET | Freq: Four times a day (QID) | ORAL | Status: DC | PRN
  Administered 2012-02-27: 4 mg via ORAL
  Filled 2012-02-27: qty 1

## 2012-02-27 MED ORDER — MORPHINE SULFATE 2 MG/ML IJ SOLN
2.0000 mg | Freq: Once | INTRAMUSCULAR | Status: AC
  Administered 2012-02-27: 2 mg via INTRAVENOUS
  Filled 2012-02-27: qty 1

## 2012-02-27 MED ORDER — SODIUM CHLORIDE 0.9 % IJ SOLN: 3.0000 mL | Freq: Two times a day (BID) | INTRAMUSCULAR | Status: DC

## 2012-02-27 MED ORDER — ONDANSETRON HCL 4 MG/2ML IJ SOLN
4.0000 mg | Freq: Four times a day (QID) | INTRAMUSCULAR | Status: DC | PRN
  Administered 2012-02-29: 4 mg via INTRAVENOUS
  Filled 2012-02-27 (×2): qty 2

## 2012-02-27 MED ORDER — HYDROCODONE-ACETAMINOPHEN 10-325 MG PO TABS
1.0000 | ORAL_TABLET | ORAL | Status: DC | PRN
  Administered 2012-02-27 – 2012-03-01 (×14): 1 via ORAL
  Filled 2012-02-27 (×14): qty 1

## 2012-02-27 MED ORDER — RISAQUAD PO CAPS
1.0000 | ORAL_CAPSULE | Freq: Every day | ORAL | Status: DC
  Administered 2012-02-27 – 2012-03-01 (×4): 1 via ORAL
  Filled 2012-02-27 (×7): qty 1

## 2012-02-27 MED ORDER — VANCOMYCIN HCL 10 G IV SOLR
1250.0000 mg | INTRAVENOUS | Status: DC
  Administered 2012-02-27 – 2012-02-29 (×3): 1250 mg via INTRAVENOUS
  Filled 2012-02-27 (×6): qty 1250

## 2012-02-27 MED ORDER — SODIUM CHLORIDE 0.9 % IV SOLN: Freq: Once | INTRAVENOUS | Status: DC

## 2012-02-27 MED ORDER — HYDROCORTISONE 10 MG PO TABS
10.0000 mg | ORAL_TABLET | Freq: Every day | ORAL | Status: DC
  Administered 2012-02-27 – 2012-03-01 (×4): 10 mg via ORAL
  Filled 2012-02-27 (×7): qty 1

## 2012-02-27 MED ORDER — WARFARIN - PHARMACIST DOSING INPATIENT: Freq: Every day | Status: DC

## 2012-02-27 MED ORDER — SODIUM CHLORIDE 0.9 % IV SOLN
INTRAVENOUS | Status: DC
  Administered 2012-02-27: 1000 mL via INTRAVENOUS
  Administered 2012-02-27: 15:00:00 via INTRAVENOUS

## 2012-02-27 MED ORDER — LEVOTHYROXINE SODIUM 25 MCG PO TABS
125.0000 ug | ORAL_TABLET | Freq: Every day | ORAL | Status: DC
  Administered 2012-02-27 – 2012-03-01 (×4): 125 ug via ORAL
  Filled 2012-02-27 (×7): qty 1

## 2012-02-27 MED ORDER — DEXTROSE 5 % IV SOLN
100.0000 mg | Freq: Once | INTRAVENOUS | Status: AC
  Administered 2012-02-27: 100 mg via INTRAVENOUS
  Filled 2012-02-27: qty 100

## 2012-02-27 MED ORDER — POTASSIUM CHLORIDE CRYS ER 20 MEQ PO TBCR
40.0000 meq | EXTENDED_RELEASE_TABLET | Freq: Two times a day (BID) | ORAL | Status: DC
  Administered 2012-02-27 – 2012-03-01 (×5): 40 meq via ORAL
  Filled 2012-02-27 (×6): qty 2

## 2012-02-27 MED ORDER — ONDANSETRON HCL 4 MG/2ML IJ SOLN
4.0000 mg | Freq: Once | INTRAMUSCULAR | Status: AC
  Administered 2012-02-27: 4 mg via INTRAVENOUS
  Filled 2012-02-27: qty 2

## 2012-02-27 MED ORDER — CHLORTHALIDONE 25 MG PO TABS
25.0000 mg | ORAL_TABLET | Freq: Every day | ORAL | Status: DC
  Administered 2012-02-27 – 2012-03-01 (×4): 25 mg via ORAL
  Filled 2012-02-27 (×7): qty 1

## 2012-02-27 MED ORDER — DILTIAZEM HCL ER BEADS 300 MG PO CP24
300.0000 mg | ORAL_CAPSULE | Freq: Every day | ORAL | Status: DC
  Administered 2012-02-27 – 2012-03-01 (×4): 300 mg via ORAL
  Filled 2012-02-27 (×5): qty 1

## 2012-02-27 MED ORDER — WARFARIN 1.25 MG HALF TABLET: 1.2500 mg | ORAL_TABLET | Freq: Every day | ORAL | Status: DC

## 2012-02-27 MED ORDER — WARFARIN SODIUM 2.5 MG PO TABS
2.5000 mg | ORAL_TABLET | Freq: Once | ORAL | Status: AC
  Administered 2012-02-27: 2.5 mg via ORAL
  Filled 2012-02-27 (×2): qty 1

## 2012-02-27 MED ORDER — SODIUM CHLORIDE 0.9 % IV BOLUS (SEPSIS)
500.0000 mL | Freq: Once | INTRAVENOUS | Status: AC
  Administered 2012-02-27: 500 mL via INTRAVENOUS

## 2012-02-27 NOTE — ED Provider Notes (Signed)
History  This chart was scribed for Donnetta Hutching, MD by Ardeen Jourdain, ED Scribe. This patient was seen in room APA05/APA05 and the patient's care was started at 1139.  CSN: [id number]  Arrival date & time 02/27/12  1127   First MD Initiated Contact with Patient 02/27/12 1139      Chief Complaint  Patient presents with  . Shortness of Breath  . Chest Pain  . Palpitations     The history is provided by the patient. No language interpreter was used.    [name] is a 79 y.o. female who presents to the Emergency Department complaining of left sided CP as well as leg swelling and redness with associated chest tightness, SOB, nausea and emesis. She states the SOB is aggravated by laying down. She reports the pain began 3 days ago. She states she has been unable to eat or drink. She states the same symptoms began 3 weeks ago and she was seen by Dr. Sherwood Gambler. She states she was given antibiotics for the leg swelling and redness but the legs have improved,  per her neighbor.    Past Medical History  Diagnosis Date  . Rheumatic heart disease   . IBS (irritable bowel syndrome)   . GERD (gastroesophageal reflux disease)   . Breast cancer   . Mitral regurgitation   . Mitral stenosis   . Atrial fibrillation   . PAT (paroxysmal atrial tachycardia)   . Hypothyroidism   . Melanoma of neck   . Microscopic colitis 2003    Was maintained on Asacol BID  . S/P colonoscopy April 2010    Friable anal canal, left-sided diverticula, adenomatous polyp  . S/P endoscopy April 2010    Benign polyp, s/p 54-F Maloney dilation  . Hiatal hernia   . Diverticula of colon   . Tubulovillous adenoma   . Adenomatous polyp     Past Surgical History  Procedure Date  . Cardiac catheterization     NORMAL CORONARY ARTERIES AT CATH IN 2006  . Abdominal hysterectomy   . Mastectomy     RIGHT BREAST, followed by left breast after radiation  . Melanoma removal     SURGERY NECK  . Cataracts   . Hemorrhoid  surgery   . Tonsillectomy   . Colonoscopy 06/12/2008    Dr. Audelia Hives anal canal, left-sided diverticula, adenomatous polyp  . Esophagogastroduodenoscopy 06/12/2008    Dr. Larina Bras polyp, s/p 54-F Elease Hashimoto dilation  . Colonoscopy 09/28/2011    Dr. Jena Gauss- tubular adenoma, colonic diverticulitis    Family History  Problem Relation Age of Onset  . Stroke Mother   . Heart attack Father   . Breast cancer Sister   . Colon cancer Neg Hx     History  Substance Use Topics  . Smoking status: Never Smoker   . Smokeless tobacco: Never Used  . Alcohol Use: No   No OB history available.  Review of Systems  All other systems reviewed and are negative.  A complete 10 system review of systems was obtained and all systems are negative except as noted in the HPI and PMH.    Allergies  Amitiza; Ciprofloxacin; and Sulfonamide derivatives  Home Medications   Current Outpatient Rx  Name  Route  Sig  Dispense  Refill  . ALPRAZOLAM 0.5 MG PO TABS   Oral   Take 0.5 mg by mouth at bedtime as needed.          . CHLORTHALIDONE 25 MG PO TABS  Oral   Take 25 mg by mouth daily.           Marland Kitchen CLINDAMYCIN HCL 150 MG PO CAPS   Oral   Take 150 mg by mouth 4 (four) times daily. Seven days only         . COUMADIN 2.5 MG PO TABS      TAKE 1 TABLET BY MOUTH DAILY EXCEPT 1/2 TABLET ON MONDAY,WEDNESDAY,& FRIDAY.   30 tablet   4   . DICYCLOMINE HCL 10 MG PO CAPS   Oral   Take 10 mg by mouth daily as needed.          Marland Kitchen DILTIAZEM HCL ER COATED BEADS 300 MG PO CP24   Oral   Take 1 capsule (300 mg total) by mouth daily.   90 capsule   1     Dose increase   . FUROSEMIDE 20 MG PO TABS   Oral   Take 20 mg by mouth as needed. Swelling         . HYDROCODONE-ACETAMINOPHEN 10-500 MG PO TABS   Oral   Take 1 tablet by mouth every 6 (six) hours as needed.           Marland Kitchen HYDROCORTISONE 10 MG PO TABS   Oral   Take 10 mg by mouth daily.         Marland Kitchen LEVOTHYROXINE SODIUM 125 MCG PO  TABS   Oral   Take 125 mcg by mouth daily.         Marland Kitchen LINACLOTIDE 145 MCG PO CAPS   Oral   Take 1 capsule (145 mcg total) by mouth daily.   30 capsule   1   . PANTOPRAZOLE SODIUM 40 MG PO TBEC   Oral   Take 40 mg by mouth 2 (two) times daily.           Marland Kitchen POTASSIUM CHLORIDE CRYS ER 10 MEQ PO TBCR   Oral   Take 1 tablet by mouth Daily.         Marland Kitchen POTASSIUM CHLORIDE 10 MEQ PO TBCR   Oral   Take 10 mEq by mouth 3 (three) times daily.          . RESTORA PO CAPS   Oral   Take by mouth daily.         Marland Kitchen PROPRANOLOL HCL 80 MG PO TABS   Oral   Take 80 mg by mouth daily.             Triage Vitals: BP 138/71  Pulse 98  Temp 97.7 F (36.5 C) (Oral)  Resp 20  Ht 5\' 3"  (1.6 m)  Wt 150 lb (68.04 kg)  BMI 26.57 kg/m2  Physical Exam  Nursing note and vitals reviewed. Constitutional: She is oriented to person, place, and time. She appears well-developed and well-nourished.  HENT:  Head: Normocephalic and atraumatic.  Eyes: Conjunctivae normal and EOM are normal. Pupils are equal, round, and reactive to light.  Neck: Normal range of motion. Neck supple.  Cardiovascular: Normal rate, regular rhythm and normal heart sounds.   Pulmonary/Chest: Effort normal and breath sounds normal.  Abdominal: Soft. Bowel sounds are normal. She exhibits distension. There is no tenderness.       Distended but non-tender   Musculoskeletal: Normal range of motion.  Neurological: She is alert and oriented to person, place, and time.  Skin: Skin is warm and dry. There is erythema.       Erythema and edema on bilateral  legs, left greater than right   Psychiatric: She has a normal mood and affect.    ED Course  Procedures (including critical care time)  DIAGNOSTIC STUDIES:   COORDINATION OF CARE:  11:53 AM: Discussed treatment plan which includes IV fluids, a CBC and a UA with pt at bedside and pt agreed to plan.    Labs Reviewed  COMPREHENSIVE METABOLIC PANEL - Abnormal; Notable  for the following:    Sodium 133 (*)     Potassium 2.8 (*)     Chloride 93 (*)     BUN 24 (*)     GFR calc non Af Amer 64 (*)     GFR calc Af Amer 74 (*)     All other components within normal limits  PROTIME-INR - Abnormal; Notable for the following:    Prothrombin Time 26.6 (*)     INR 2.60 (*)     All other components within normal limits  CBC WITH DIFFERENTIAL  URINALYSIS, ROUTINE W REFLEX MICROSCOPIC  CBC WITH DIFFERENTIAL  LIPASE, BLOOD  TROPONIN I   No results found.   No diagnosis found.   Date: 02/27/2012  Rate: 94  Rhythm: atrial fibrillation  QRS Axis: normal  Intervals: normal  ST/T Wave abnormalities: normal  Conduction Disutrbances:none  Narrative Interpretation:   Old EKG Reviewed: changes noted PVC's  MDM  Physical exam reveals cellulitis in her lower extremities left greater than right. Additionally potassium is 2.8    Will hydrate patient, start IV antibiotics, potassium replacement.  Patient lives independently and is unable to take care of herself. Will admit      I personally performed the services described in this documentation, which was scribed in my presence. The recorded information has been reviewed and is accurate.    Donnetta Hutching, MD 02/27/12 [phone]

## 2012-02-27 NOTE — ED Notes (Signed)
Lab unable to obtain blood sample. To come back after pt receives some fluids. Initial blood work drawn with IV was hemolyzed.

## 2012-02-27 NOTE — H&P (Signed)
[facility] History and Physical  [name]:[id number] DOB: [birth year] DOA: 02/27/2012  Referring physician: Dr. Adriana Simas, ER physician. PCP: Cassell Smiles., MD  Specialists: Dr. Simona Huh, cardiology.  Chief Complaint: Swelling and redness of the legs, left greater than right.  HPI: [name] is a 79 y.o. female who presents with swelling and redness of both her legs, more on the left than the right. She has been not feeling well for the last couple of weeks and has noticed the redness appeared again. She had episode of cellulitis previously and this recovered completely. She was given oral antibiotics but has not improved and therefore seems to have failed outpatient therapy for cellulitis. She has had poor appetite with some nausea and has not been able to carry out her activities at home. Normally she is very independent. She has no help apart from a friend who is a Network engineer. This friend is with her at the bedside today. She denies any subjective fevers.   Review of Systems: .  Apart from history of present illness, other systems negative.  Past Medical History  Diagnosis Date  . Rheumatic heart disease   . IBS (irritable bowel syndrome)   . GERD (gastroesophageal reflux disease)   . Breast cancer   . Mitral regurgitation   . Mitral stenosis   . Atrial fibrillation   . PAT (paroxysmal atrial tachycardia)   . Hypothyroidism   . Melanoma of neck   . Microscopic colitis 2003    Was maintained on Asacol BID  . S/P colonoscopy April 2010    Friable anal canal, left-sided diverticula, adenomatous polyp  . S/P endoscopy April 2010    Benign polyp, s/p 54-F Maloney dilation  . Hiatal hernia   . Diverticula of colon   . Tubulovillous adenoma   . Adenomatous polyp    Past Surgical History  Procedure Date  . Cardiac catheterization     NORMAL CORONARY ARTERIES AT CATH IN 2006  . Abdominal hysterectomy   . Mastectomy     RIGHT BREAST, followed by left breast after  radiation  . Melanoma removal     SURGERY NECK  . Cataracts   . Hemorrhoid surgery   . Tonsillectomy   . Colonoscopy 06/12/2008    Dr. Audelia Hives anal canal, left-sided diverticula, adenomatous polyp  . Esophagogastroduodenoscopy 06/12/2008    Dr. Larina Bras polyp, s/p 54-F Elease Hashimoto dilation  . Colonoscopy 09/28/2011    Dr. Jena Gauss- tubular adenoma, colonic diverticulitis   Social History:  She is single, has never been married. She does not have any children. She does not smoke cigarettes. She does not drink alcohol. She is retired. When she is well, she is fully independent.  Allergies  Allergen Reactions  . Amitiza (Lubiprostone) Swelling  . Ciprofloxacin Nausea And Vomiting  . Linzess (Linaclotide) Swelling and Rash  . Sulfonamide Derivatives Nausea And Vomiting and Rash    Family History  Problem Relation Age of Onset  . Stroke Mother   . Heart attack Father   . Breast cancer Sister   . Colon cancer Neg Hx       Prior to Admission medications   Medication Sig Start Date End Date Taking? Authorizing Provider  ALPRAZolam Prudy Feeler) 0.5 MG tablet Take 0.5 mg by mouth at bedtime as needed. Sleep 06/22/11  Yes Historical Provider, MD  chlorthalidone (HYGROTON) 25 MG tablet Take 25 mg by mouth daily.     Yes Historical Provider, MD  dicyclomine (BENTYL) 10 MG capsule Take 10  mg by mouth daily as needed. Stomach Pain   Yes Historical Provider, MD  diltiazem (CARDIZEM CD) 300 MG 24 hr capsule Take 1 capsule (300 mg total) by mouth daily. 01/12/12  Yes Jonelle Sidle, MD  furosemide (LASIX) 20 MG tablet Take 20 mg by mouth as needed. Swelling   Yes Historical Provider, MD  HYDROcodone-acetaminophen (LORTAB) 10-500 MG per tablet Take 1 tablet by mouth every 6 (six) hours as needed. Pain   Yes Historical Provider, MD  hydrocortisone (CORTEF) 10 MG tablet Take 10 mg by mouth daily.   Yes Historical Provider, MD  levothyroxine (SYNTHROID, LEVOTHROID) 125 MCG tablet Take 125 mcg by mouth  daily.   Yes Historical Provider, MD  Linaclotide Karlene Einstein) 145 MCG CAPS Take 1 capsule (145 mcg total) by mouth daily. 01/02/12  Yes Nira Retort, NP  pantoprazole (PROTONIX) 40 MG tablet Take 40 mg by mouth 2 (two) times daily.     Yes Historical Provider, MD  potassium chloride (KLOR-CON) 10 MEQ CR tablet Take 10 mEq by mouth 3 (three) times daily.    Yes Historical Provider, MD  Probiotic Product (ALIGN) 4 MG CAPS Take 4 mg by mouth daily.   Yes Historical Provider, MD  propranolol (INDERAL) 80 MG tablet Take 80 mg by mouth daily.     Yes Historical Provider, MD  warfarin (COUMADIN) 2.5 MG tablet Take 1.25-2.5 mg by mouth daily. Takes a whole tablet everyday except for Mon, Wed, and Fri when patient takes half a tablet.   Yes Historical Provider, MD   Physical Exam: Filed Vitals:   02/27/12 1139 02/27/12 1629  BP: 138/71 130/83  Pulse: 98 91  Temp: 97.7 F (36.5 C)   TempSrc: Oral   Resp: 20 18  Height: 5\' 3"  (1.6 m)   Weight: 68.04 kg (150 lb)   SpO2:  95%     General:  She looks somewhat clinically dehydrated .She is not toxic or septic.  Eyes: No pallor. No jaundice.  ENT: No major abnormalities.  Neck: No lymphadenopathy.  Cardiovascular: Heart sounds are present and appear to be in atrial fibrillation. No murmurs.  Respiratory: Lung fields are clear.  Abdomen: Soft, nontender, no hepatosplenomegaly.  Skin: Bilateral leg cellulitis greater on the left than the right. The cellulitis in both legs is in the lower legs. It affects the foot also.  Musculoskeletal: No major joint abnormalities.  Psychiatric: Appropriate affect.  Neurologic: Alert and orientated without any focal neurological signs.  Labs on Admission:  Basic Metabolic Panel:  Lab 02/27/12 1610  NA 133*  K 2.8*  CL 93*  CO2 28  GLUCOSE 94  BUN 24*  CREATININE 0.84  CALCIUM 9.4  MG --  PHOS --   Liver Function Tests:  Lab 02/27/12 1302  AST 25  ALT 12  ALKPHOS 80  BILITOT 1.0  PROT  7.7  ALBUMIN 3.5    Lab 02/27/12 1302  LIPASE 17  AMYLASE --    CBC:  Lab 02/27/12 1302 02/27/12 1245  WBC 7.0 TEST RESCHEDULED  NEUTROABS 4.4 PENDING  HGB 13.0 TEST RESCHEDULED  HCT 38.7 TEST RESCHEDULED  MCV 93.0 TEST RESCHEDULED  PLT 255 TEST RESCHEDULED   Cardiac Enzymes:  Lab 02/27/12 1302  CKTOTAL --  CKMB --  CKMBINDEX --  TROPONINI <0.30       Radiological Exams on Admission: Dg Chest Port 1 View  02/27/2012  *RADIOLOGY REPORT*  Clinical Data: Chest pain  PORTABLE CHEST - 1 VIEW  Comparison: 07/08/2010  Findings: Moderate cardiomegaly is stable.  Normal pulmonary vascularity.  Lungs are clear.  No pneumothorax or pleural effusion.  Bi-apical pleural thickening is stable.  IMPRESSION: Cardiomegaly without decompensation.   Original Report Authenticated By: Jolaine Click, M.D.       Assessment/Plan Principal Problem:  *Cellulitis and abscess of lower leg Active Problems:  Mitral stenosis  Atrial fibrillation  Bilateral lower extremity edema   1. Bilateral leg cellulitis, greater on the left than the right. 2. Bilateral lower extremity edema. 3. Mild mitral stenosis. 4. Chronic atrial fibrillation on Coumadin. 5. Hypokalemia.  Plan: 1. Admit to telemetry floor. 2. Intravenous antibiotics with vancomycin. 3. Replete potassium. Further recommendations will depend on patient's hospital progress.  Code Status: Full code.   Family Communication: Discussed plan with patient at the bedside.   Disposition Plan: Home in medically stable.   Time spent: 30 minutes.  Wilson Singer [facility] Pager [phone].  If 7PM-7AM, please contact night-coverage [email or web address] Password [facility] 02/27/2012, 4:47 PM

## 2012-02-27 NOTE — Progress Notes (Signed)
ANTIBIOTIC CONSULT NOTE - INITIAL  Pharmacy Consult for Vancomycin Indication: cellulitis  Allergies  Allergen Reactions  . Amitiza (Lubiprostone) Swelling  . Ciprofloxacin Nausea And Vomiting  . Linzess (Linaclotide) Swelling and Rash  . Sulfonamide Derivatives Nausea And Vomiting and Rash    Patient Measurements: Height: 5\' 3"  (160 cm) Weight: 150 lb (68.04 kg) IBW/kg (Calculated) : 52.4    Vital Signs: Temp: 97.7 F (36.5 C) (01/07 1139) Temp src: Oral (01/07 1139) BP: 130/83 mmHg (01/07 1629) Pulse Rate: 91  (01/07 1629) Intake/Output from previous day:   Intake/Output from this shift:    Labs:  Basename 02/27/12 1302 02/27/12 1245  WBC 7.0 TEST RESCHEDULED  HGB 13.0 TEST RESCHEDULED  PLT 255 TEST RESCHEDULED  LABCREA -- --  CREATININE 0.84 --   Estimated Creatinine Clearance: 49.4 ml/min (by C-G formula based on Cr of 0.84). No results found for this basename: VANCOTROUGH:2,VANCOPEAK:2,VANCORANDOM:2,GENTTROUGH:2,GENTPEAK:2,GENTRANDOM:2,TOBRATROUGH:2,TOBRAPEAK:2,TOBRARND:2,AMIKACINPEAK:2,AMIKACINTROU:2,AMIKACIN:2, in the last 72 hours   Microbiology: No results found for this or any previous visit (from the past 720 hour(s)).  Medical History: Past Medical History  Diagnosis Date  . Rheumatic heart disease   . IBS (irritable bowel syndrome)   . GERD (gastroesophageal reflux disease)   . Breast cancer   . Mitral regurgitation   . Mitral stenosis   . Atrial fibrillation   . PAT (paroxysmal atrial tachycardia)   . Hypothyroidism   . Melanoma of neck   . Microscopic colitis 2003    Was maintained on Asacol BID  . S/P colonoscopy April 2010    Friable anal canal, left-sided diverticula, adenomatous polyp  . S/P endoscopy April 2010    Benign polyp, s/p 54-F Maloney dilation  . Hiatal hernia   . Diverticula of colon   . Tubulovillous adenoma   . Adenomatous polyp     Medications:  Scheduled:    . ALIGN  4 mg Oral Daily  . chlorthalidone  25 mg  Oral Daily  . diltiazem  300 mg Oral Daily  . hydrocortisone  10 mg Oral Daily  . levothyroxine  125 mcg Oral Daily  . [COMPLETED]  morphine injection  2 mg Intravenous Once  . [COMPLETED]  morphine injection  2 mg Intravenous Once  . [COMPLETED] ondansetron (ZOFRAN) IV  4 mg Intravenous Once  . pantoprazole  40 mg Oral BID  . potassium chloride SA  40 mEq Oral Once  . potassium chloride  40 mEq Oral BID  . propranolol  80 mg Oral Daily  . sodium chloride  3 mL Intravenous Q12H  . warfarin  1.25-2.5 mg Oral Daily  . warfarin  2.5 mg Oral ONCE-1800  . Warfarin - Pharmacist Dosing Inpatient   Does not apply q1800   Assessment: Vancomycin for cellulitis CrCL 49.4 ml/min  Goal of Therapy:  Vancomycin trough level 10-15 mcg/ml  Plan:  Vancomycin 1250 mg IV every 24 hours Vancomycin trough at steady state Monitor renal function Labs per protocol  Raquel James, Dieter Hane Bennett 02/27/2012,4:53 PM

## 2012-02-27 NOTE — ED Notes (Signed)
Pt sent to er from [facility] for evaluation of chest pain, pt states that she did not have ekg done at dr. Isidore Moos, pt states that she has had left side chest pain that is described as a tightness that started three days ago, pain is associated with n/v, sob that is worse with laying down.

## 2012-02-27 NOTE — Progress Notes (Signed)
ANTICOAGULATION CONSULT NOTE - Initial Consult  Pharmacy Consult for Warfarin Indication: atrial fibrillation  Allergies  Allergen Reactions  . Amitiza (Lubiprostone) Swelling  . Ciprofloxacin Nausea And Vomiting  . Linzess (Linaclotide) Swelling and Rash  . Sulfonamide Derivatives Nausea And Vomiting and Rash    Patient Measurements: Height: 5\' 3"  (160 cm) Weight: 150 lb (68.04 kg) IBW/kg (Calculated) : 52.4    Vital Signs: Temp: 97.7 F (36.5 C) (01/07 1139) Temp src: Oral (01/07 1139) BP: 130/83 mmHg (01/07 1629) Pulse Rate: 91  (01/07 1629)  Labs:  Basename 02/27/12 1302 02/27/12 1245 02/26/12 0839  HGB 13.0 TEST RESCHEDULED --  HCT 38.7 TEST RESCHEDULED --  PLT 255 TEST RESCHEDULED --  APTT -- -- --  LABPROT 26.6* -- --  INR 2.60* -- 3.4  HEPARINUNFRC -- -- --  CREATININE 0.84 -- --  CKTOTAL -- -- --  CKMB -- -- --  TROPONINI <0.30 -- --    Estimated Creatinine Clearance: 49.4 ml/min (by C-G formula based on Cr of 0.84).   Medical History: Past Medical History  Diagnosis Date  . Rheumatic heart disease   . IBS (irritable bowel syndrome)   . GERD (gastroesophageal reflux disease)   . Breast cancer   . Mitral regurgitation   . Mitral stenosis   . Atrial fibrillation   . PAT (paroxysmal atrial tachycardia)   . Hypothyroidism   . Melanoma of neck   . Microscopic colitis 2003    Was maintained on Asacol BID  . S/P colonoscopy April 2010    Friable anal canal, left-sided diverticula, adenomatous polyp  . S/P endoscopy April 2010    Benign polyp, s/p 54-F Maloney dilation  . Hiatal hernia   . Diverticula of colon   . Tubulovillous adenoma   . Adenomatous polyp     Medications:  Scheduled:    . ALIGN  4 mg Oral Daily  . chlorthalidone  25 mg Oral Daily  . diltiazem  300 mg Oral Daily  . hydrocortisone  10 mg Oral Daily  . levothyroxine  125 mcg Oral Daily  . [COMPLETED]  morphine injection  2 mg Intravenous Once  . [COMPLETED]  morphine  injection  2 mg Intravenous Once  . [COMPLETED] ondansetron (ZOFRAN) IV  4 mg Intravenous Once  . pantoprazole  40 mg Oral BID  . potassium chloride SA  40 mEq Oral Once  . potassium chloride  40 mEq Oral BID  . propranolol  80 mg Oral Daily  . sodium chloride  3 mL Intravenous Q12H  . warfarin  1.25-2.5 mg Oral Daily  . warfarin  2.5 mg Oral ONCE-1800  . Warfarin - Pharmacist Dosing Inpatient   Does not apply q1800    Assessment: Patient takes Coumadin 2.5 mg daily at home except 1.25 mg on Monday, Wednesday, Friday per PTA INR 2.6 today  Goal of Therapy:  INR 2-3 Monitor platelets by anticoagulation protocol: Yes   Plan:  Coumadin 2.5 mg po today at 6 PM  (continuation of home dose) INR/PT daily Monitor platelets / CBC Labs per protocol  Raquel James, Cathe Bilger Bennett 02/27/2012,4:55 PM

## 2012-02-28 ENCOUNTER — Telehealth: Payer: Self-pay | Admitting: Cardiology

## 2012-02-28 LAB — COMPREHENSIVE METABOLIC PANEL
ALT: 10 U/L (ref 0–35)
Alkaline Phosphatase: 62 U/L (ref 39–117)
BUN: 21 mg/dL (ref 6–23)
CO2: 28 mEq/L (ref 19–32)
Chloride: 101 mEq/L (ref 96–112)
GFR calc Af Amer: 73 mL/min — ABNORMAL LOW (ref >90)
GFR calc non Af Amer: 63 mL/min — ABNORMAL LOW (ref >90)
Glucose, Bld: 116 mg/dL — ABNORMAL HIGH (ref 70–99)
Potassium: 3.4 mEq/L — ABNORMAL LOW (ref 3.5–5.1)
Sodium: 138 mEq/L (ref 135–145)
Total Bilirubin: 0.5 mg/dL (ref 0.3–1.2)
Total Protein: 6.2 g/dL (ref 6.0–8.3)

## 2012-02-28 LAB — URINALYSIS, ROUTINE W REFLEX MICROSCOPIC
Bilirubin Urine: NEGATIVE
Ketones, ur: 15 mg/dL — AB
Nitrite: POSITIVE — AB
Protein, ur: 30 mg/dL — AB
pH: 5 (ref 5.0–8.0)

## 2012-02-28 LAB — PROTIME-INR: Prothrombin Time: 27.6 seconds — ABNORMAL HIGH (ref 11.6–15.2)

## 2012-02-28 LAB — CBC
MCH: 31.4 pg (ref 26.0–34.0)
MCHC: 33.7 g/dL (ref 30.0–36.0)
MCV: 93.2 fL (ref 78.0–100.0)
Platelets: 249 10*3/uL (ref 150–400)
RBC: 3.66 MIL/uL — ABNORMAL LOW (ref 3.87–5.11)
RDW: 14.2 % (ref 11.5–15.5)

## 2012-02-28 LAB — URINE MICROSCOPIC-ADD ON

## 2012-02-28 MED ORDER — HYDROCORTISONE 1 % EX CREA
TOPICAL_CREAM | Freq: Two times a day (BID) | CUTANEOUS | Status: DC
  Administered 2012-02-28 – 2012-03-01 (×4): 1 via TOPICAL
  Filled 2012-02-28 (×4): qty 1.5

## 2012-02-28 MED ORDER — HYDROCORTISONE 0.5 % EX CREA: TOPICAL_CREAM | Freq: Two times a day (BID) | CUTANEOUS | Status: DC

## 2012-02-28 MED ORDER — WARFARIN SODIUM 2 MG PO TABS
2.0000 mg | ORAL_TABLET | Freq: Every day | ORAL | Status: DC
  Administered 2012-02-28: 2 mg via ORAL
  Filled 2012-02-28: qty 1

## 2012-02-28 MED ORDER — LEVOFLOXACIN IN D5W 250 MG/50ML IV SOLN
250.0000 mg | INTRAVENOUS | Status: DC
  Administered 2012-02-28: 250 mg via INTRAVENOUS
  Filled 2012-02-28: qty 50

## 2012-02-28 MED ORDER — PHENAZOPYRIDINE HCL 100 MG PO TABS
100.0000 mg | ORAL_TABLET | Freq: Three times a day (TID) | ORAL | Status: DC
  Administered 2012-02-28 – 2012-03-01 (×6): 100 mg via ORAL
  Filled 2012-02-28 (×6): qty 1

## 2012-02-28 MED ORDER — POTASSIUM CHLORIDE CRYS ER 20 MEQ PO TBCR
40.0000 meq | EXTENDED_RELEASE_TABLET | Freq: Once | ORAL | Status: AC
  Administered 2012-02-28: 40 meq via ORAL

## 2012-02-28 NOTE — Care Management Note (Signed)
    Page 1 of 2   03/01/2012     3:02:39 PM   CARE MANAGEMENT NOTE 03/01/2012  Patient:  [name], [name]   Account Number:  [id number]  Date Initiated:  02/28/2012  Documentation initiated by:  Rosemary Holms  Subjective/Objective Assessment:   Pt admitted with cellulitis after failed outpt AB. Lives alone but has friends for support.     Action/Plan:   Anticipated DC Date:  02/29/2012   Anticipated DC Plan:  HOME/SELF CARE      DC Planning Services  CM consult      Choice offered to / List presented to:     DME arranged  [facility]YGEN      DME agency  [facility]     [facility] arranged  HH-1 RN  HH-10 DISEASE MANAGEMENT      HH agency  [facility]   Status of service:  Completed, signed off Medicare Important Message given?   (If response is "NO", the following Medicare IM given date fields will be blank) Date Medicare IM given:   Date Additional Medicare IM given:    Discharge Disposition:  HOME W HOME HEALTH SERVICES  Per UR Regulation:    If discussed at Long Length of Stay Meetings, dates discussed:    Comments:  03/01/12 Rosemary Holms RN BSN CM Pt does not qualify for O2 based on current MR and Dx. [facility] will deliver O2 and assist in getting Dr. Diona Browner to provide the med Records needed to qualify pt for O2. Neighbor here to assist pt home and is aware of O2 situation.  02/28/12 Shelbia Scinto Leanord Hawking RN BSN CM

## 2012-02-28 NOTE — Progress Notes (Signed)
ANTICOAGULATION CONSULT NOTE   Pharmacy Consult for Warfarin Indication: atrial fibrillation  Allergies  Allergen Reactions  . Amitiza (Lubiprostone) Swelling  . Ciprofloxacin Nausea And Vomiting  . Linzess (Linaclotide) Swelling and Rash  . Sulfonamide Derivatives Nausea And Vomiting and Rash   Patient Measurements: Height: 5\' 3"  (160 cm) Weight: 150 lb 12.7 oz (68.4 kg) IBW/kg (Calculated) : 52.4   Vital Signs: BP: 100/85 mmHg (01/08 0944)  Labs:  Basename 02/28/12 0517 02/27/12 1302 02/27/12 1245 02/26/12 0839  HGB 11.5* 13.0 -- --  HCT 34.1* 38.7 TEST RESCHEDULED --  PLT 249 255 TEST RESCHEDULED --  APTT -- -- -- --  LABPROT 27.6* 26.6* -- --  INR 2.73* 2.60* -- 3.4  HEPARINUNFRC -- -- -- --  CREATININE 0.85 0.84 -- --  CKTOTAL -- -- -- --  CKMB -- -- -- --  TROPONINI -- <0.30 -- --   Estimated Creatinine Clearance: 49 ml/min (by C-G formula based on Cr of 0.85).  Medical History: Past Medical History  Diagnosis Date  . Rheumatic heart disease   . IBS (irritable bowel syndrome)   . GERD (gastroesophageal reflux disease)   . Breast cancer   . Mitral regurgitation   . Mitral stenosis   . Atrial fibrillation   . PAT (paroxysmal atrial tachycardia)   . Hypothyroidism   . Melanoma of neck   . Microscopic colitis 2003    Was maintained on Asacol BID  . S/P colonoscopy April 2010    Friable anal canal, left-sided diverticula, adenomatous polyp  . S/P endoscopy April 2010    Benign polyp, s/p 54-F Maloney dilation  . Hiatal hernia   . Diverticula of colon   . Tubulovillous adenoma   . Adenomatous polyp    Medications:  Scheduled:     . acidophilus  1 capsule Oral Daily  . chlorthalidone  25 mg Oral Daily  . diltiazem  300 mg Oral Daily  . [COMPLETED] doxycycline (VIBRAMYCIN) IV  100 mg Intravenous Once  . hydrocortisone  10 mg Oral Daily  . levothyroxine  125 mcg Oral Daily  . [COMPLETED]  morphine injection  2 mg Intravenous Once  . [COMPLETED]   morphine injection  2 mg Intravenous Once  . [COMPLETED] ondansetron (ZOFRAN) IV  4 mg Intravenous Once  . pantoprazole  40 mg Oral BID  . phenazopyridine  100 mg Oral TID WC  . [COMPLETED] potassium chloride SA  40 mEq Oral Once  . potassium chloride  40 mEq Oral BID  . potassium chloride  40 mEq Oral Once  . propranolol  80 mg Oral Daily  . [COMPLETED] sodium chloride  500 mL Intravenous Once  . sodium chloride  3 mL Intravenous Q12H  . vancomycin  1,250 mg Intravenous Q24H  . [COMPLETED] warfarin  2.5 mg Oral ONCE-1800  . Warfarin - Pharmacist Dosing Inpatient   Does not apply q1800  . [DISCONTINUED] sodium chloride   Intravenous Once  . [DISCONTINUED] warfarin  1.25-2.5 mg Oral Daily   Assessment: Patient takes Coumadin 2.5 mg daily at home except 1.25 mg on Monday, Wednesday, Friday per PTA.  I would favor simplifying home regimen so will begin Coumadin 2mg  daily starting today.  Goal of Therapy:  INR 2-3 Monitor platelets by anticoagulation protocol: Yes   Plan: Coumadin 2mg  daily at 6pm (adjustment of home dose in attempt to simplify regimen) INR/PT daily Monitor platelets / CBC Labs per protocol  Valrie Hart A 02/28/2012,11:07 AM

## 2012-02-28 NOTE — Progress Notes (Signed)
Utilization Review Complete  

## 2012-02-28 NOTE — Progress Notes (Signed)
     Subjective: This lady is somewhat better than yesterday. She still has not got a good appetite. She describes dysuria the urinalysis was negative.           Physical Exam: Blood pressure 100/85, pulse 88, temperature 97.3 F (36.3 C), temperature source Oral, resp. rate 18, height 5\' 3"  (1.6 m), weight 68.4 kg (150 lb 12.7 oz), SpO2 95.00%. She looks systemically well. The cellulitis is significantly improved overnight in both legs. She is alert and orientated. Heart sounds are present and normal without murmurs. Lung fields are clear.   Investigations:     Basic Metabolic Panel:  Basename 02/28/12 0517 02/27/12 1302  NA 138 133*  K 3.4* 2.8*  CL 101 93*  CO2 28 28  GLUCOSE 116* 94  BUN 21 24*  CREATININE 0.85 0.84  CALCIUM 8.3* 9.4  MG -- --  PHOS -- --   Liver Function Tests:  [facility] 02/28/12 0517 02/27/12 1302  AST 20 25  ALT 10 12  ALKPHOS 62 80  BILITOT 0.5 1.0  PROT 6.2 7.7  ALBUMIN 2.7* 3.5     CBC:  Basename 02/28/12 0517 02/27/12 1302 02/27/12 1245  WBC 6.0 7.0 --  NEUTROABS -- 4.4 PENDING  HGB 11.5* 13.0 --  HCT 34.1* 38.7 --  MCV 93.2 93.0 --  PLT 249 255 --    Dg Chest Port 1 View  02/27/2012  *RADIOLOGY REPORT*  Clinical Data: Chest pain  PORTABLE CHEST - 1 VIEW  Comparison: 07/08/2010  Findings: Moderate cardiomegaly is stable.  Normal pulmonary vascularity.  Lungs are clear.  No pneumothorax or pleural effusion.  Bi-apical pleural thickening is stable.  IMPRESSION: Cardiomegaly without decompensation.   Original Report Authenticated By: Jolaine Click, M.D.       Medications: I have reviewed the patient's current medications.  Impression: 1. Cellulitis of both legs greater on the left than the right. Improving. 2. Bilateral extremity edema, improving. 3. Hypokalemia. 4. Dysuria. 5. Atrial fibrillation on chronic anticoagulation with Coumadin.    Plan: 1. Replete potassium. 2. Discontinue IV fluids and encourage oral  intake. 3. Continue intravenous vancomycin. 4. Possible discharge home next day or 2 .     LOS: 1 day   Wilson Singer Pager [phone]  02/28/2012, 10:15 AM

## 2012-02-28 NOTE — Progress Notes (Signed)
Patient was complaining of burning and itching while urinating. Doctor was notified and new orders were given to get another UA on the patient.

## 2012-02-29 ENCOUNTER — Inpatient Hospital Stay (HOSPITAL_COMMUNITY): Payer: Medicare Other

## 2012-02-29 DIAGNOSIS — R0602 Shortness of breath: Secondary | ICD-10-CM

## 2012-02-29 LAB — URINE CULTURE

## 2012-02-29 LAB — CBC
Hemoglobin: 13.2 g/dL (ref 12.0–15.0)
MCV: 95.7 fL (ref 78.0–100.0)
Platelets: 224 10*3/uL (ref 150–400)
RBC: 4.14 MIL/uL (ref 3.87–5.11)
WBC: 9.2 10*3/uL (ref 4.0–10.5)

## 2012-02-29 LAB — VANCOMYCIN, RANDOM: Vancomycin Rm: 10.8 ug/mL

## 2012-02-29 LAB — COMPREHENSIVE METABOLIC PANEL
ALT: 16 U/L (ref 0–35)
AST: 31 U/L (ref 0–37)
CO2: 27 mEq/L (ref 19–32)
Chloride: 101 mEq/L (ref 96–112)
Creatinine, Ser: 0.78 mg/dL (ref 0.50–1.10)
GFR calc Af Amer: 89 mL/min — ABNORMAL LOW (ref >90)
GFR calc non Af Amer: 77 mL/min — ABNORMAL LOW (ref >90)
Glucose, Bld: 118 mg/dL — ABNORMAL HIGH (ref 70–99)
Sodium: 138 mEq/L (ref 135–145)
Total Bilirubin: 0.8 mg/dL (ref 0.3–1.2)

## 2012-02-29 MED ORDER — LEVALBUTEROL HCL 0.63 MG/3ML IN NEBU
0.6300 mg | INHALATION_SOLUTION | Freq: Four times a day (QID) | RESPIRATORY_TRACT | Status: DC | PRN
  Administered 2012-02-29 – 2012-03-01 (×3): 0.63 mg via RESPIRATORY_TRACT
  Filled 2012-02-29 (×3): qty 3

## 2012-02-29 MED ORDER — METHYLPREDNISOLONE SODIUM SUCC 125 MG IJ SOLR
125.0000 mg | Freq: Four times a day (QID) | INTRAMUSCULAR | Status: DC
  Administered 2012-02-29 – 2012-03-01 (×4): 125 mg via INTRAVENOUS
  Filled 2012-02-29 (×5): qty 2

## 2012-02-29 MED ORDER — METHYLPREDNISOLONE SODIUM SUCC 125 MG IJ SOLR
125.0000 mg | Freq: Once | INTRAMUSCULAR | Status: AC
  Administered 2012-02-29: 125 mg via INTRAVENOUS
  Filled 2012-02-29: qty 2

## 2012-02-29 MED ORDER — PROPRANOLOL HCL 20 MG PO TABS
40.0000 mg | ORAL_TABLET | Freq: Every day | ORAL | Status: DC
  Administered 2012-02-29 – 2012-03-01 (×2): 40 mg via ORAL
  Filled 2012-02-29 (×2): qty 2

## 2012-02-29 NOTE — Progress Notes (Addendum)
     Subjective: This lady is complaining of dyspnea and wheezing this morning. She had been started on intravenous Levaquin yesterday. Her cellulitis appears to be improving.           Physical Exam: Blood pressure 110/68, pulse 68, temperature 97.8 F (36.6 C), temperature source Oral, resp. rate 20, height 5\' 3"  (1.6 m), weight 68.4 kg (150 lb 12.7 oz), SpO2 96.00%. She looks systemically well. The cellulitis is improving in both legs. She is alert and orientated. Heart sounds are present and normal without murmurs and in atrial fibrillation. Lung fields show bilateral wheezing, not very tight. No crackles or bronchial breathing. She is alert and orientated.   Investigations:     Basic Metabolic Panel:  Basename 02/29/12 0547 02/28/12 0517  NA 138 138  K 3.8 3.4*  CL 101 101  CO2 27 28  GLUCOSE 118* 116*  BUN 18 21  CREATININE 0.78 0.85  CALCIUM 8.5 8.3*  MG -- --  PHOS -- --   Liver Function Tests:  [facility] 02/29/12 0547 02/28/12 0517  AST 31 20  ALT 16 10  ALKPHOS 70 62  BILITOT 0.8 0.5  PROT 6.5 6.2  ALBUMIN 2.9* 2.7*     CBC:  Basename 02/29/12 0547 02/28/12 0517 02/27/12 1302 02/27/12 1245  WBC 9.2 6.0 -- --  NEUTROABS -- -- 4.4 PENDING  HGB 13.2 11.5* -- --  HCT 39.6 34.1* -- --  MCV 95.7 93.2 -- --  PLT 224 249 -- --    Dg Chest Port 1 View  02/27/2012  *RADIOLOGY REPORT*  Clinical Data: Chest pain  PORTABLE CHEST - 1 VIEW  Comparison: 07/08/2010  Findings: Moderate cardiomegaly is stable.  Normal pulmonary vascularity.  Lungs are clear.  No pneumothorax or pleural effusion.  Bi-apical pleural thickening is stable.  IMPRESSION: Cardiomegaly without decompensation.   Original Report Authenticated By: Jolaine Click, M.D.       Medications: I have reviewed the patient's current medications.  Impression: 1. Cellulitis of both legs greater on the left than the right. Improving. 2. Wheezing? Bronchitis versus congestive heart failure. I  clinically favor bronchitis versus congestive heart failure. 3. Atrial fibrillation on chronic anticoagulation.     Plan: 1. Stat chest x-ray. 2. One dose of intravenous Solu-Medrol. 3. Continue with intravenous vancomycin. Discontinue IV Levaquin. Await urine culture. 4. Mobilize.     LOS: 2 days   Wilson Singer Pager [phone]  02/29/2012, 8:35 AM

## 2012-02-29 NOTE — Progress Notes (Signed)
Wheezing progressively worse today. Pt c/o SOB. O2 is 89% on 3L, O2 increased to 4L and pts O2 sat is at 97%. RT made aware and coming to assess. Sheryn Bison

## 2012-02-29 NOTE — Progress Notes (Signed)
ANTICOAGULATION CONSULT NOTE   Pharmacy Consult for Warfarin Indication: atrial fibrillation  Allergies  Allergen Reactions  . Amitiza (Lubiprostone) Swelling  . Ciprofloxacin Nausea And Vomiting  . Linzess (Linaclotide) Swelling and Rash  . Sulfonamide Derivatives Nausea And Vomiting and Rash   Patient Measurements: Height: 5\' 3"  (160 cm) Weight: 150 lb 12.7 oz (68.4 kg) IBW/kg (Calculated) : 52.4   Vital Signs: Temp: 97.8 F (36.6 C) (01/09 0716) Temp src: Oral (01/09 0716) BP: 110/68 mmHg (01/09 0716) Pulse Rate: 68  (01/09 0716)  Labs:  Basename 02/29/12 0547 02/28/12 0517 02/27/12 1302  HGB 13.2 11.5* --  HCT 39.6 34.1* 38.7  PLT 224 249 255  APTT -- -- --  LABPROT 31.7* 27.6* 26.6*  INR 3.30* 2.73* 2.60*  HEPARINUNFRC -- -- --  CREATININE 0.78 0.85 0.84  CKTOTAL -- -- --  CKMB -- -- --  TROPONINI -- -- <0.30   Estimated Creatinine Clearance: 52.1 ml/min (by C-G formula based on Cr of 0.78).  Medical History: Past Medical History  Diagnosis Date  . Rheumatic heart disease   . IBS (irritable bowel syndrome)   . GERD (gastroesophageal reflux disease)   . Breast cancer   . Mitral regurgitation   . Mitral stenosis   . Atrial fibrillation   . PAT (paroxysmal atrial tachycardia)   . Hypothyroidism   . Melanoma of neck   . Microscopic colitis 2003    Was maintained on Asacol BID  . S/P colonoscopy April 2010    Friable anal canal, left-sided diverticula, adenomatous polyp  . S/P endoscopy April 2010    Benign polyp, s/p 54-F Maloney dilation  . Hiatal hernia   . Diverticula of colon   . Tubulovillous adenoma   . Adenomatous polyp    Medications:  Scheduled:     . acidophilus  1 capsule Oral Daily  . chlorthalidone  25 mg Oral Daily  . diltiazem  300 mg Oral Daily  . hydrocortisone  10 mg Oral Daily  . hydrocortisone cream   Topical BID  . levothyroxine  125 mcg Oral Daily  . methylPREDNISolone (SOLU-MEDROL) injection  125 mg Intravenous Once    . pantoprazole  40 mg Oral BID  . phenazopyridine  100 mg Oral TID WC  . potassium chloride  40 mEq Oral BID  . [COMPLETED] potassium chloride  40 mEq Oral Once  . propranolol  40 mg Oral Daily  . sodium chloride  3 mL Intravenous Q12H  . vancomycin  1,250 mg Intravenous Q24H  . Warfarin - Pharmacist Dosing Inpatient   Does not apply q1800  . [DISCONTINUED] hydrocortisone cream   Topical BID  . [DISCONTINUED] levofloxacin (LEVAQUIN) IV  250 mg Intravenous Q24H  . [DISCONTINUED] propranolol  80 mg Oral Daily  . [DISCONTINUED] warfarin  2 mg Oral q1800   Assessment: INR is supra-therapeutic today .  Pt received 2mg  1/8. Patient takes Coumadin 2.5 mg daily at home except 1.25 mg on Monday, Wednesday, Friday per PTA.  I would favor simplifying home regimen to a single daily dose if possible.  Goal of Therapy:  INR 2-3 Monitor platelets by anticoagulation protocol: Yes   Plan: HOLD coumadin today INR/PT daily Monitor platelets / CBC Labs per protocol  Valrie Hart A 02/29/2012,10:39 AM

## 2012-02-29 NOTE — Progress Notes (Signed)
Day nurse thought that the patient might be having a mild reaction to IV Levaquin and there was itching around the IV site and it was red, and stopped the antibiotic before going off shift.  When accessing the patients IV it appeared to be normal, but when trying to flush it was found to be occluded.  On call MD was notified about the possible reaction.  No new orders were given, will pass to the day nurse to follow up with MD.

## 2012-02-29 NOTE — Progress Notes (Signed)
Pt states that her wheezing is better than it was earlier, since receiving solu-medrol. Still SOB. Headache throughout the day, resting at times, pt states hx of headaches/migraines. Sheryn Bison

## 2012-03-01 LAB — CBC
MCHC: 33.2 g/dL (ref 30.0–36.0)
Platelets: 247 10*3/uL (ref 150–400)
RDW: 14.5 % (ref 11.5–15.5)
WBC: 7.5 10*3/uL (ref 4.0–10.5)

## 2012-03-01 LAB — PROTIME-INR
INR: 3.35 — ABNORMAL HIGH (ref 0.00–1.49)
Prothrombin Time: 32.1 seconds — ABNORMAL HIGH (ref 11.6–15.2)

## 2012-03-01 MED ORDER — AMOXICILLIN-POT CLAVULANATE 875-125 MG PO TABS: 1.0000 | ORAL_TABLET | Freq: Two times a day (BID) | ORAL | Status: DC

## 2012-03-01 MED ORDER — PREDNISONE 20 MG PO TABS: ORAL_TABLET | ORAL | Status: DC

## 2012-03-01 MED ORDER — PHENAZOPYRIDINE HCL 100 MG PO TABS: 100.0000 mg | ORAL_TABLET | Freq: Three times a day (TID) | ORAL | Status: DC

## 2012-03-01 MED ORDER — HYDROCORTISONE 1 % EX CREA: TOPICAL_CREAM | Freq: Two times a day (BID) | CUTANEOUS | Status: DC

## 2012-03-01 NOTE — Discharge Summary (Signed)
Physician Discharge Summary  [name] ZOX:[id number] DOB: [birth year] DOA: 02/27/2012  PCP: Cassell Smiles., MD  Admit date: 02/27/2012 Discharge date: 03/01/2012  Time spent: Greater than 30 minutes  Recommendations for Outpatient Follow-up:  1. Followup with primary care physician in the next week or so.  2. Home health RN.  Discharge Diagnoses:  1. Bilateral lower leg cellulitis, worse on the left than the right, improving. 2. Probable drug reaction with wheezing temporarily and redness of her skin,? Allergy to Levaquin. 3. Bilateral lower extremity edema, improved. 4. Atrial fibrillation on chronic anticoagulation with Coumadin.   Discharge Condition: Stable and improving.  Diet recommendation: Regular.  Filed Weights   02/27/12 1139 02/27/12 1700  Weight: 68.04 kg (150 lb) 68.4 kg (150 lb 12.7 oz)    History of present illness:  This very pleasant 79 year old lady presents to the hospital with symptoms of swelling and redness of both legs, left greater than right. Please see initial history as outlined below: HPI: [name] is a 79 y.o. female who presents with swelling and redness of both her legs, more on the left than the right. She has been not feeling well for the last couple of weeks and has noticed the redness appeared again. She had episode of cellulitis previously and this recovered completely. She was given oral antibiotics but has not improved and therefore seems to have failed outpatient therapy for cellulitis. She has had poor appetite with some nausea and has not been able to carry out her activities at home. Normally she is very independent. She has no help apart from a friend who is a Network engineer. This friend is with her at the bedside today.  She denies any subjective fevers.  Hospital Course:  Patient was admitted and started on intravenous vancomycin for cellulitis. She also had some edema in her legs and she was treated with intravenous Lasix. This improved  her symptoms tremendously. However during her hospitalization, she started to become wheezy, possibly related to intravenous Levaquin. She was given intravenous steroids and her wheezing is improved significantly, back to baseline now. In the meantime, cellulitis is improved significantly, not all gone but virtually all gone. She feels comfortable now to go home on oral antibiotics, which I agree with. She will need followup closely with her primary care physician regarding this. She does require some home oxygen temporarily I suspect. We will help her with home health RN.  Procedures:  None.   Consultations:  None.  Discharge Exam: Filed Vitals:   02/29/12 1524 02/29/12 2123 03/01/12 0624 03/01/12 1040  BP:  138/65 124/79   Pulse:  77 82   Temp:  97.6 F (36.4 C) 97.6 F (36.4 C)   TempSrc:  Oral Oral   Resp:  20 20   Height:      Weight:      SpO2: 92% 92% 94% 88%    General: She looks systemically well. She is not toxic or septic. Cardiovascular: Heart sounds are present and normal without murmurs or gallop rhythm. Respiratory: Lung fields are entirely clear now with no wheezing. There are no crackles, no bronchial breathing. She is alert and oriented. Legs-much improved from initial presentation of cellulitis bilaterally.  Discharge Instructions  Discharge Orders    Future Appointments: Provider: Department: Dept Phone: Center:   03/25/2012 9:20 AM [facility] [phone] [id number]     Future Orders Please Complete By Expires   Diet - low sodium heart healthy  Increase activity slowly          Medication List     As of 03/01/2012 10:54 AM    TAKE these medications         ALIGN 4 MG Caps   Take 4 mg by mouth daily.      ALPRAZolam 0.5 MG tablet   Commonly known as: XANAX   Take 0.5 mg by mouth at bedtime as needed. Sleep      amoxicillin-clavulanate 875-125 MG per tablet   Commonly known as: AUGMENTIN   Take  1 tablet by mouth 2 (two) times daily.      chlorthalidone 25 MG tablet   Commonly known as: HYGROTON   Take 25 mg by mouth daily.      dicyclomine 10 MG capsule   Commonly known as: BENTYL   Take 10 mg by mouth daily as needed. Stomach Pain      diltiazem 300 MG 24 hr capsule   Commonly known as: CARDIZEM CD   Take 1 capsule (300 mg total) by mouth daily.      furosemide 20 MG tablet   Commonly known as: LASIX   Take 20 mg by mouth as needed. Swelling      HYDROcodone-acetaminophen 10-500 MG per tablet   Commonly known as: LORTAB   Take 1 tablet by mouth every 6 (six) hours as needed. Pain      hydrocortisone 10 MG tablet   Commonly known as: CORTEF   Take 10 mg by mouth daily.      hydrocortisone cream 1 %   Apply topically 2 (two) times daily.      levothyroxine 125 MCG tablet   Commonly known as: SYNTHROID, LEVOTHROID   Take 125 mcg by mouth daily.      Linaclotide 145 MCG Caps   Commonly known as: LINZESS   Take 1 capsule (145 mcg total) by mouth daily.      pantoprazole 40 MG tablet   Commonly known as: PROTONIX   Take 40 mg by mouth 2 (two) times daily.      phenazopyridine 100 MG tablet   Commonly known as: PYRIDIUM   Take 1 tablet (100 mg total) by mouth 3 (three) times daily with meals.      potassium chloride 10 MEQ CR tablet   Commonly known as: KLOR-CON   Take 10 mEq by mouth 3 (three) times daily.      predniSONE 20 MG tablet   Commonly known as: DELTASONE   Take 2 tablets daily for 2 days, one tablet daily for 2 days, half tablet daily for 2 days, then STOP.      propranolol 80 MG tablet   Commonly known as: INDERAL   Take 80 mg by mouth daily.      warfarin 2.5 MG tablet   Commonly known as: COUMADIN   Take 1.25-2.5 mg by mouth daily. Takes a whole tablet everyday except for Mon, Wed, and Fri when patient takes half a tablet.          The results of significant diagnostics from this hospitalization (including imaging, microbiology,  ancillary and laboratory) are listed below for reference.    Significant Diagnostic Studies: Dg Chest 1 View  02/29/2012  *RADIOLOGY REPORT*  Clinical Data: Shortness of breath, wheezing  CHEST - 1 VIEW  Comparison: Portable chest x-ray of 02/27/2012  Findings: Very mild bibasilar linear atelectasis is present.  No definite pneumonia is seen.  The heart is mildly enlarged.  No  bony abnormalities seen.  IMPRESSION: Bibasilar linear atelectasis.  No definite pneumonia.  Stable cardiomegaly.   Original Report Authenticated By: Dwyane Dee, M.D.    Dg Chest Port 1 View  02/27/2012  *RADIOLOGY REPORT*  Clinical Data: Chest pain  PORTABLE CHEST - 1 VIEW  Comparison: 07/08/2010  Findings: Moderate cardiomegaly is stable.  Normal pulmonary vascularity.  Lungs are clear.  No pneumothorax or pleural effusion.  Bi-apical pleural thickening is stable.  IMPRESSION: Cardiomegaly without decompensation.   Original Report Authenticated By: Jolaine Click, M.D.     Microbiology: Recent Results (from the past 240 hour(s))  URINE CULTURE     Status: Normal   Collection Time   02/28/12  2:40 PM      Component Value Range Status Comment   Specimen Description URINE, CLEAN CATCH   Final    Special Requests NONE   Final    Culture  Setup Time 02/29/2012 02:02   Final    Colony Count 2,000 COLONIES/ML   Final    Culture INSIGNIFICANT GROWTH   Final    Report Status 02/29/2012 FINAL   Final      Labs: Basic Metabolic Panel:  Lab 02/29/12 7829 02/28/12 0517 02/27/12 1302  NA 138 138 133*  K 3.8 3.4* 2.8*  CL 101 101 93*  CO2 27 28 28   GLUCOSE 118* 116* 94  BUN 18 21 24*  CREATININE 0.78 0.85 0.84  CALCIUM 8.5 8.3* 9.4  MG -- -- --  PHOS -- -- --   Liver Function Tests:  Lab 02/29/12 0547 02/28/12 0517 02/27/12 1302  AST 31 20 25   ALT 16 10 12   ALKPHOS 70 62 80  BILITOT 0.8 0.5 1.0  PROT 6.5 6.2 7.7  ALBUMIN 2.9* 2.7* 3.5    Lab 02/27/12 1302  LIPASE 17  AMYLASE --    CBC:  Lab 03/01/12 0459  02/29/12 0547 02/28/12 0517 02/27/12 1302 02/27/12 1245  WBC 7.5 9.2 6.0 7.0 TEST RESCHEDULED  NEUTROABS -- -- -- 4.4 PENDING  HGB 12.1 13.2 11.5* 13.0 TEST RESCHEDULED  HCT 36.4 39.6 34.1* 38.7 TEST RESCHEDULED  MCV 93.6 95.7 93.2 93.0 TEST RESCHEDULED  PLT 247 224 249 255 TEST RESCHEDULED   Cardiac Enzymes:  Lab 02/27/12 1302  CKTOTAL --  CKMB --  CKMBINDEX --  TROPONINI <0.30         Signed:  Ayla Dunigan C  [facility] 03/01/2012, 10:54 AM

## 2012-03-01 NOTE — Progress Notes (Signed)
ANTICOAGULATION CONSULT NOTE   Pharmacy Consult for Warfarin Indication: atrial fibrillation  Allergies  Allergen Reactions  . Amitiza (Lubiprostone) Swelling  . Ciprofloxacin Nausea And Vomiting  . Linzess (Linaclotide) Swelling and Rash  . Sulfonamide Derivatives Nausea And Vomiting and Rash   Patient Measurements: Height: 5\' 3"  (160 cm) Weight: 150 lb 12.7 oz (68.4 kg) IBW/kg (Calculated) : 52.4   Vital Signs: Temp: 97.6 F (36.4 C) (01/10 0624) Temp src: Oral (01/10 0624) BP: 124/79 mmHg (01/10 0624) Pulse Rate: 82  (01/10 0624)  Labs:  Basename 03/01/12 0459 02/29/12 0547 02/28/12 0517 02/27/12 1302  HGB 12.1 13.2 -- --  HCT 36.4 39.6 34.1* --  PLT 247 224 249 --  APTT -- -- -- --  LABPROT 32.1* 31.7* 27.6* --  INR 3.35* 3.30* 2.73* --  HEPARINUNFRC -- -- -- --  CREATININE -- 0.78 0.85 0.84  CKTOTAL -- -- -- --  CKMB -- -- -- --  TROPONINI -- -- -- <0.30   Estimated Creatinine Clearance: 52.1 ml/min (by C-G formula based on Cr of 0.78).  Medical History: Past Medical History  Diagnosis Date  . Rheumatic heart disease   . IBS (irritable bowel syndrome)   . GERD (gastroesophageal reflux disease)   . Breast cancer   . Mitral regurgitation   . Mitral stenosis   . Atrial fibrillation   . PAT (paroxysmal atrial tachycardia)   . Hypothyroidism   . Melanoma of neck   . Microscopic colitis 2003    Was maintained on Asacol BID  . S/P colonoscopy April 2010    Friable anal canal, left-sided diverticula, adenomatous polyp  . S/P endoscopy April 2010    Benign polyp, s/p 54-F Maloney dilation  . Hiatal hernia   . Diverticula of colon   . Tubulovillous adenoma   . Adenomatous polyp    Medications:  Scheduled:     . acidophilus  1 capsule Oral Daily  . chlorthalidone  25 mg Oral Daily  . diltiazem  300 mg Oral Daily  . hydrocortisone  10 mg Oral Daily  . hydrocortisone cream   Topical BID  . levothyroxine  125 mcg Oral Daily  . [COMPLETED]  methylPREDNISolone (SOLU-MEDROL) injection  125 mg Intravenous Once  . methylPREDNISolone (SOLU-MEDROL) injection  125 mg Intravenous Q6H  . pantoprazole  40 mg Oral BID  . phenazopyridine  100 mg Oral TID WC  . potassium chloride  40 mEq Oral BID  . propranolol  40 mg Oral Daily  . sodium chloride  3 mL Intravenous Q12H  . vancomycin  1,250 mg Intravenous Q24H  . Warfarin - Pharmacist Dosing Inpatient   Does not apply q1800  . [DISCONTINUED] levofloxacin (LEVAQUIN) IV  250 mg Intravenous Q24H  . [DISCONTINUED] propranolol  80 mg Oral Daily  . [DISCONTINUED] warfarin  2 mg Oral q1800   Assessment: 79 yo F on chronic warfarin 2.5mg  on MWF & 1.25mg  on TTSS for Afib. INR was therapeutic on admission.  INR is now supra-therapeutic despite dose reduction on 1/8 and held dose on 1/9.  No bleeding noted.   Goal of Therapy:  INR 2-3   Plan: HOLD coumadin today INR/PT daily  Elson Clan 03/01/2012,8:08 AM

## 2012-03-01 NOTE — Plan of Care (Signed)
Problem: Discharge Progression Outcomes Goal: Discharge plan in place and appropriate Outcome: Adequate for Discharge Pt d/c home with home health and home oxygen theraphy

## 2012-03-01 NOTE — Progress Notes (Signed)
ANTIBIOTIC CONSULT NOTE  Pharmacy Consult for Vancomycin Indication: cellulitis  Allergies  Allergen Reactions  . Amitiza (Lubiprostone) Swelling  . Ciprofloxacin Nausea And Vomiting  . Linzess (Linaclotide) Swelling and Rash  . Sulfonamide Derivatives Nausea And Vomiting and Rash    Patient Measurements: Height: 5\' 3"  (160 cm) Weight: 150 lb 12.7 oz (68.4 kg) IBW/kg (Calculated) : 52.4    Vital Signs: Temp: 97.6 F (36.4 C) (01/10 0624) Temp src: Oral (01/10 0624) BP: 124/79 mmHg (01/10 0624) Pulse Rate: 82  (01/10 0624) Intake/Output from previous day: 01/09 0701 - 01/10 0700 In: 370 [P.O.:120; IV Piggyback:250] Out: 700 [Urine:700] Intake/Output from this shift:    Labs:  Basename 03/01/12 0459 02/29/12 0547 02/28/12 0517 02/27/12 1302  WBC 7.5 9.2 6.0 --  HGB 12.1 13.2 11.5* --  PLT 247 224 249 --  LABCREA -- -- -- --  CREATININE -- 0.78 0.85 0.84   Estimated Creatinine Clearance: 52.1 ml/min (by C-G formula based on Cr of 0.78).  Basename 02/29/12 1840  VANCOTROUGH --  VANCOPEAK --  VANCORANDOM 10.8  GENTTROUGH --  GENTPEAK --  GENTRANDOM --  TOBRATROUGH --  TOBRAPEAK --  TOBRARND --  AMIKACINPEAK --  AMIKACINTROU --  AMIKACIN --     Microbiology: Recent Results (from the past 720 hour(s))  URINE CULTURE     Status: Normal   Collection Time   02/28/12  2:40 PM      Component Value Range Status Comment   Specimen Description URINE, CLEAN CATCH   Final    Special Requests NONE   Final    Culture  Setup Time 02/29/2012 02:02   Final    Colony Count 2,000 COLONIES/ML   Final    Culture INSIGNIFICANT GROWTH   Final    Report Status 02/29/2012 FINAL   Final     Medical History: Past Medical History  Diagnosis Date  . Rheumatic heart disease   . IBS (irritable bowel syndrome)   . GERD (gastroesophageal reflux disease)   . Breast cancer   . Mitral regurgitation   . Mitral stenosis   . Atrial fibrillation   . PAT (paroxysmal atrial  tachycardia)   . Hypothyroidism   . Melanoma of neck   . Microscopic colitis 2003    Was maintained on Asacol BID  . S/P colonoscopy April 2010    Friable anal canal, left-sided diverticula, adenomatous polyp  . S/P endoscopy April 2010    Benign polyp, s/p 54-F Maloney dilation  . Hiatal hernia   . Diverticula of colon   . Tubulovillous adenoma   . Adenomatous polyp     Medications:  Scheduled:     . acidophilus  1 capsule Oral Daily  . chlorthalidone  25 mg Oral Daily  . diltiazem  300 mg Oral Daily  . hydrocortisone  10 mg Oral Daily  . hydrocortisone cream   Topical BID  . levothyroxine  125 mcg Oral Daily  . [COMPLETED] methylPREDNISolone (SOLU-MEDROL) injection  125 mg Intravenous Once  . methylPREDNISolone (SOLU-MEDROL) injection  125 mg Intravenous Q6H  . pantoprazole  40 mg Oral BID  . phenazopyridine  100 mg Oral TID WC  . potassium chloride  40 mEq Oral BID  . propranolol  40 mg Oral Daily  . sodium chloride  3 mL Intravenous Q12H  . vancomycin  1,250 mg Intravenous Q24H  . Warfarin - Pharmacist Dosing Inpatient   Does not apply q1800  . [DISCONTINUED] levofloxacin (LEVAQUIN) IV  250 mg Intravenous Q24H  . [  DISCONTINUED] propranolol  80 mg Oral Daily  . [DISCONTINUED] warfarin  2 mg Oral q1800   Assessment: 79 yo F on day#4 Vancomycin for cellulitis which is clinically improving per MD note.  Renal function is stable.  Cx data is negative to date.  Vancomycin trough within goal range.   Goal of Therapy:  Vancomycin trough level 10-15 mcg/ml  Plan:  Continue Vancomycin 1250 mg IV every 24 hours Weekly Vancomycin trough while on Vancomycin Monitor renal function & cx data  Elson Clan 03/01/2012,8:14 AM

## 2012-03-04 ENCOUNTER — Ambulatory Visit (INDEPENDENT_AMBULATORY_CARE_PROVIDER_SITE_OTHER): Payer: Medicare Other | Admitting: *Deleted

## 2012-03-04 ENCOUNTER — Telehealth: Payer: Self-pay | Admitting: Cardiology

## 2012-03-04 DIAGNOSIS — Z7901 Long term (current) use of anticoagulants: Secondary | ICD-10-CM

## 2012-03-04 DIAGNOSIS — I4891 Unspecified atrial fibrillation: Secondary | ICD-10-CM

## 2012-03-04 LAB — POCT INR: INR: 3.4

## 2012-03-04 NOTE — Telephone Encounter (Signed)
FYI:Pt called back and wanted this nurse to speak with her neighbor wendy, wendy advised that she was advised by brittney with home health care that the pt will also need to be seen by her cardiologist to verfiy DX of CHF or another heart disease in order for the insurance to pay for her oxygen in addition to the dx she will received from PCP concerning O2  Lower than 88% room air, noted pt does not currently have DX of CHF however noted, mitral stenosis and A-Fib in pt chart at this time, pt accepted a post hospital follow up apt 03-08-12 with KL at 2:40pm to address

## 2012-03-04 NOTE — Telephone Encounter (Signed)
Sounds good

## 2012-03-04 NOTE — Telephone Encounter (Signed)
Patient needs DX for oxygen so that Medicaid will pay for it. / tgs

## 2012-03-04 NOTE — Telephone Encounter (Signed)
Called pt to advise that MD SM does not order Oxygen and that she will need to contact PCP to have this order sent in for her, pt noted that MD Sherwood Gambler is out of the office currently and she has already scheduled and apt with [facility] on 03-13-12, pt notes she just received the oxygen on Friday and she is not in danger of running out currently, just needed to have an order placed for her so Medicaid will pay for it, advised that someone in the office should be able to have an order placed in absence of MD Fusco, pt understood and will call office back with any further cardiac needs

## 2012-03-07 ENCOUNTER — Ambulatory Visit (INDEPENDENT_AMBULATORY_CARE_PROVIDER_SITE_OTHER): Payer: Medicare Other | Admitting: *Deleted

## 2012-03-07 DIAGNOSIS — Z7901 Long term (current) use of anticoagulants: Secondary | ICD-10-CM

## 2012-03-07 DIAGNOSIS — I4891 Unspecified atrial fibrillation: Secondary | ICD-10-CM

## 2012-03-07 LAB — POCT INR: INR: 2.7

## 2012-03-08 ENCOUNTER — Ambulatory Visit (INDEPENDENT_AMBULATORY_CARE_PROVIDER_SITE_OTHER): Payer: Medicare Other | Admitting: Adult Health

## 2012-03-08 ENCOUNTER — Encounter: Payer: Self-pay | Admitting: Adult Health

## 2012-03-08 VITALS — BP 104/78 | HR 89 | Ht 63.0 in | Wt 145.1 lb

## 2012-03-08 DIAGNOSIS — R0602 Shortness of breath: Secondary | ICD-10-CM

## 2012-03-08 DIAGNOSIS — I10 Essential (primary) hypertension: Secondary | ICD-10-CM

## 2012-03-08 DIAGNOSIS — I050 Rheumatic mitral stenosis: Secondary | ICD-10-CM

## 2012-03-08 DIAGNOSIS — I4891 Unspecified atrial fibrillation: Secondary | ICD-10-CM

## 2012-03-08 NOTE — Assessment & Plan Note (Signed)
She continues to have chronic dyspnea and will require O2 at all times. I have requested that she have a portable O2 tank provided so that she can be more mobile outside of her house.  She has had O2 desat's while admitted to the hospital for < 88% documented. She is easily DOE. No surgical intervention is planned to MV. Continue medical management.

## 2012-03-08 NOTE — Progress Notes (Deleted)
Name: [name]    DOB: [birth year]  Age: 79 y.o.  MR#: [id number]       PCP:  Cassell Smiles., MD      Insurance: @PAYORNAME @   CC:   No chief complaint on file.   VS There were no vitals taken for this visit.  Weights Current Weight  02/27/12 150 lb 12.7 oz (68.4 kg)  01/12/12 149 lb 0.6 oz (67.604 kg)  01/10/12 150 lb 6.4 oz (68.221 kg)    Blood Pressure  BP Readings from Last 3 Encounters:  03/01/12 136/80  01/12/12 129/85  01/10/12 131/73     Admit date:  (Not on file) Last encounter with RMR:  Visit date not found   Allergy Allergies  Allergen Reactions  . Amitiza (Lubiprostone) Swelling  . Ciprofloxacin Nausea And Vomiting  . Linzess (Linaclotide) Swelling and Rash  . Sulfonamide Derivatives Nausea And Vomiting and Rash    Current Outpatient Prescriptions  Medication Sig Dispense Refill  . ALPRAZolam (XANAX) 0.5 MG tablet Take 0.5 mg by mouth at bedtime as needed. Sleep      . amoxicillin-clavulanate (AUGMENTIN) 875-125 MG per tablet Take 1 tablet by mouth 2 (two) times daily.  14 tablet  0  . chlorthalidone (HYGROTON) 25 MG tablet Take 25 mg by mouth daily.        Marland Kitchen dicyclomine (BENTYL) 10 MG capsule Take 10 mg by mouth daily as needed. Stomach Pain      . diltiazem (CARDIZEM CD) 300 MG 24 hr capsule Take 1 capsule (300 mg total) by mouth daily.  90 capsule  1  . furosemide (LASIX) 20 MG tablet Take 20 mg by mouth as needed. Swelling      . HYDROcodone-acetaminophen (LORTAB) 10-500 MG per tablet Take 1 tablet by mouth every 6 (six) hours as needed. Pain      . hydrocortisone (CORTEF) 10 MG tablet Take 10 mg by mouth daily.      . hydrocortisone cream 1 % Apply topically 2 (two) times daily.  30 g  0  . levothyroxine (SYNTHROID, LEVOTHROID) 125 MCG tablet Take 125 mcg by mouth daily.      . Linaclotide (LINZESS) 145 MCG CAPS Take 1 capsule (145 mcg total) by mouth daily.  30 capsule  1  . pantoprazole (PROTONIX) 40 MG tablet Take 40 mg by mouth 2 (two) times  daily.        . phenazopyridine (PYRIDIUM) 100 MG tablet Take 1 tablet (100 mg total) by mouth 3 (three) times daily with meals.  10 tablet  0  . potassium chloride (KLOR-CON) 10 MEQ CR tablet Take 10 mEq by mouth 3 (three) times daily.       . predniSONE (DELTASONE) 20 MG tablet Take 2 tablets daily for 2 days, one tablet daily for 2 days, half tablet daily for 2 days, then STOP.  7 tablet  0  . Probiotic Product (ALIGN) 4 MG CAPS Take 4 mg by mouth daily.      . propranolol (INDERAL) 80 MG tablet Take 80 mg by mouth daily.        Marland Kitchen warfarin (COUMADIN) 2.5 MG tablet Take 1.25-2.5 mg by mouth daily. Takes a whole tablet everyday except for Mon, Wed, and Fri when patient takes half a tablet.        Discontinued Meds:   There are no discontinued medications.  Patient Active Problem List  Diagnosis  . Mitral stenosis  . Atrial fibrillation  . DYSPNEA  . Encounter  for long-term (current) use of anticoagulants  . Stress fracture of foot with delayed healing  . Pain, foot, chronic  . History of colonic polyps  . Constipation  . Abdominal pain  . Bilateral lower extremity edema  . Calf pain  . Cellulitis and abscess of lower leg    LABS Anti-coag visit on 03/07/2012  Component Date Value  . INR 03/07/2012 2.7   Anti-coag visit on 03/04/2012  Component Date Value  . INR 03/04/2012 3.4   Admission on 02/27/2012, Discharged on 03/01/2012  Component Date Value  . WBC 02/27/2012 TEST RESCHEDULED   . RBC 02/27/2012 TEST RESCHEDULED   . Hemoglobin 02/27/2012 TEST RESCHEDULED   . HCT 02/27/2012 TEST RESCHEDULED   . MCV 02/27/2012 TEST RESCHEDULED   . [facility] 02/27/2012 TEST RESCHEDULED   . MCHC 02/27/2012 TEST RESCHEDULED   . RDW 02/27/2012 TEST RESCHEDULED   . Platelets 02/27/2012 TEST RESCHEDULED   . Neutrophils Relative 02/27/2012 PENDING   . Neutro Abs 02/27/2012 PENDING   . Band Neutrophils 02/27/2012 PENDING   . Lymphocytes Relative 02/27/2012 PENDING   . Lymphs Abs 02/27/2012  PENDING   . Monocytes Relative 02/27/2012 PENDING   . Monocytes Absolute 02/27/2012 PENDING   . Eosinophils Relative 02/27/2012 PENDING   . Eosinophils Absolute 02/27/2012 PENDING   . Basophils Relative 02/27/2012 PENDING   . Basophils Absolute 02/27/2012 PENDING   . LUCs, % 02/27/2012 PENDING   . LUC, Absolute 02/27/2012 PENDING   . WBC Morphology 02/27/2012 PENDING   . RBC Morphology 02/27/2012 PENDING   . Smear Review 02/27/2012 PENDING   . Other 02/27/2012 PENDING   . Other 2 02/27/2012 PENDING   . nRBC 02/27/2012 PENDING   . Metamyelocytes Relative 02/27/2012 PENDING   . Myelocytes 02/27/2012 PENDING   . Promyelocytes Absolute 02/27/2012 PENDING   . Blasts 02/27/2012 PENDING   . Color, Urine 02/27/2012 YELLOW   . APPearance 02/27/2012 CLEAR   . Specific Gravity, Urine 02/27/2012 1.015   . pH 02/27/2012 6.0   . Glucose, UA 02/27/2012 NEGATIVE   . Hgb urine dipstick 02/27/2012 NEGATIVE   . Bilirubin Urine 02/27/2012 NEGATIVE   . Ketones, ur 02/27/2012 NEGATIVE   . Protein, ur 02/27/2012 NEGATIVE   . Urobilinogen, UA 02/27/2012 0.2   . Nitrite 02/27/2012 NEGATIVE   . Leukocytes, UA 02/27/2012 NEGATIVE   . WBC 02/27/2012 7.0   . RBC 02/27/2012 4.16   . Hemoglobin 02/27/2012 13.0   . HCT 02/27/2012 38.7   . MCV 02/27/2012 93.0   . [facility] 02/27/2012 31.3   . MCHC 02/27/2012 33.6   . RDW 02/27/2012 14.1   . Platelets 02/27/2012 255   . Neutrophils Relative 02/27/2012 63   . Neutro Abs 02/27/2012 4.4   . Lymphocytes Relative 02/27/2012 27   . Lymphs Abs 02/27/2012 1.9   . Monocytes Relative 02/27/2012 7   . Monocytes Absolute 02/27/2012 0.5   . Eosinophils Relative 02/27/2012 3   . Eosinophils Absolute 02/27/2012 0.2   . Basophils Relative 02/27/2012 0   . Basophils Absolute 02/27/2012 0.0   . Sodium 02/27/2012 133*  . Potassium 02/27/2012 2.8*  . Chloride 02/27/2012 93*  . CO2 02/27/2012 28   . Glucose, Bld 02/27/2012 94   . BUN 02/27/2012 24*  . Creatinine, Ser  02/27/2012 0.84   . Calcium 02/27/2012 9.4   . Total Protein 02/27/2012 7.7   . Albumin 02/27/2012 3.5   . AST 02/27/2012 25   . ALT 02/27/2012 12   . Alkaline Phosphatase 02/27/2012  80   . Total Bilirubin 02/27/2012 1.0   . GFR calc non Af Amer 02/27/2012 64*  . GFR calc Af Amer 02/27/2012 74*  . Lipase 02/27/2012 17   . Prothrombin Time 02/27/2012 26.6*  . INR 02/27/2012 2.60*  . Troponin I 02/27/2012 <0.30   . Sodium 02/28/2012 138   . Potassium 02/28/2012 3.4*  . Chloride 02/28/2012 101   . CO2 02/28/2012 28   . Glucose, Bld 02/28/2012 116*  . BUN 02/28/2012 21   . Creatinine, Ser 02/28/2012 0.85   . Calcium 02/28/2012 8.3*  . Total Protein 02/28/2012 6.2   . Albumin 02/28/2012 2.7*  . AST 02/28/2012 20   . ALT 02/28/2012 10   . Alkaline Phosphatase 02/28/2012 62   . Total Bilirubin 02/28/2012 0.5   . GFR calc non Af Amer 02/28/2012 63*  . GFR calc Af Amer 02/28/2012 73*  . Prothrombin Time 02/28/2012 27.6*  . INR 02/28/2012 2.73*  . WBC 02/28/2012 6.0   . RBC 02/28/2012 3.66*  . Hemoglobin 02/28/2012 11.5*  . HCT 02/28/2012 34.1*  . MCV 02/28/2012 93.2   . [facility] 02/28/2012 31.4   . MCHC 02/28/2012 33.7   . RDW 02/28/2012 14.2   . Platelets 02/28/2012 249   . Color, Urine 02/28/2012 ORANGE*  . APPearance 02/28/2012 CLEAR   . Specific Gravity, Urine 02/28/2012 1.025   . pH 02/28/2012 5.0   . Glucose, UA 02/28/2012 100*  . Hgb urine dipstick 02/28/2012 NEGATIVE   . Bilirubin Urine 02/28/2012 NEGATIVE   . Ketones, ur 02/28/2012 15*  . Protein, ur 02/28/2012 30*  . Urobilinogen, UA 02/28/2012 4.0*  . Nitrite 02/28/2012 POSITIVE*  . Leukocytes, UA 02/28/2012 SMALL*  . Squamous Epithelial / LPF 02/28/2012 FEW*  . WBC, UA 02/28/2012 TOO NUMEROUS TO COUNT   . Bacteria, UA 02/28/2012 FEW*  . Prothrombin Time 02/29/2012 31.7*  . INR 02/29/2012 3.30*  . WBC 02/29/2012 9.2   . RBC 02/29/2012 4.14   . Hemoglobin 02/29/2012 13.2   . HCT 02/29/2012 39.6   . MCV  02/29/2012 95.7   . [facility] 02/29/2012 31.9   . MCHC 02/29/2012 33.3   . RDW 02/29/2012 14.8   . Platelets 02/29/2012 224   . Sodium 02/29/2012 138   . Potassium 02/29/2012 3.8   . Chloride 02/29/2012 101   . CO2 02/29/2012 27   . Glucose, Bld 02/29/2012 118*  . BUN 02/29/2012 18   . Creatinine, Ser 02/29/2012 0.78   . Calcium 02/29/2012 8.5   . Total Protein 02/29/2012 6.5   . Albumin 02/29/2012 2.9*  . AST 02/29/2012 31   . ALT 02/29/2012 16   . Alkaline Phosphatase 02/29/2012 70   . Total Bilirubin 02/29/2012 0.8   . GFR calc non Af Amer 02/29/2012 77*  . GFR calc Af Amer 02/29/2012 89*  . Specimen Description 02/28/2012 URINE, CLEAN CATCH   . Special Requests 02/28/2012 NONE   . Culture  Setup Time 02/28/2012 02/29/2012 02:02   . Colony Count 02/28/2012 2,000 COLONIES/ML   . Culture 02/28/2012 INSIGNIFICANT GROWTH   . Report Status 02/28/2012 02/29/2012 FINAL   . Vancomycin Rm 02/29/2012 10.8   . Prothrombin Time 03/01/2012 32.1*  . INR 03/01/2012 3.35*  . WBC 03/01/2012 7.5   . RBC 03/01/2012 3.89   . Hemoglobin 03/01/2012 12.1   . HCT 03/01/2012 36.4   . MCV 03/01/2012 93.6   . [facility] 03/01/2012 31.1   . MCHC 03/01/2012 33.2   . RDW 03/01/2012 14.5   .  Platelets 03/01/2012 247   Anti-coag visit on 02/26/2012  Component Date Value  . INR 02/26/2012 3.4   Office Visit on 01/12/2012  Component Date Value  . INR 01/12/2012 2.9      Results for this Opt Visit:     Results for orders placed in visit on 03/07/12  POCT INR      Component Value Range   INR 2.7      EKG Orders placed during the hospital encounter of 02/27/12  . EKG 12-LEAD  . EKG 12-LEAD  . ED EKG  . ED EKG  . EKG 12-LEAD  . EKG 12-LEAD  . EKG     Prior Assessment and Plan Problem List as of 03/08/2012          Mitral stenosis   Last Assessment & Plan Note   01/12/2012 Office Visit Signed 01/12/2012  1:17 PM by Jonelle Sidle, MD    Overall mild by her last echocardiogram. Continue  symptomatic followup for now.    Atrial fibrillation   Last Assessment & Plan Note   01/12/2012 Office Visit Signed 01/12/2012  1:16 PM by Jonelle Sidle, MD    Permanent, stable symptomatically on current dose of Cardizem CD. PT/INR was 2.9 today on Coumadin. Importance of regular followup in the Coumadin clinic was reinforced, and a visit was made. Otherwise refill for Cardizem CD 300 mg daily, 3 month supply. Follow up arranged.    DYSPNEA   Last Assessment & Plan Note   06/15/2011 Office Visit Signed 06/15/2011  9:33 AM by Jonelle Sidle, MD    Plan as noted above.    Encounter for long-term (current) use of anticoagulants   Stress fracture of foot with delayed healing   Pain, foot, chronic   History of colonic polyps   Constipation   Last Assessment & Plan Note   01/10/2012 Office Visit Addendum 01/11/2012 11:15 AM by Joselyn Arrow, NP    Chronic constipation.  She is doing well on linzess daily.    Continue Linzess daily for constipation.  She is advised to follow up with Dr Sherwood Gambler about abnormal creatinine on last lab     Abdominal pain   Last Assessment & Plan Note   01/10/2012 Office Visit Addendum 01/10/2012  1:21 PM by Joselyn Arrow, NP    CT scan reassuring. Recent LFTs normal. She does have history of Gilbert's. I suspect her chronic abdominal pain is caused by chronic constipation worsened with narcotics. She has had good results with Linzess & should continue this regimen.      Bilateral lower extremity edema   Last Assessment & Plan Note   01/10/2012 Office Visit Addendum 01/11/2012 11:16 AM by Joselyn Arrow, NP    Left greater than right lower extremity edema along with calf pain and erythema of left calf.   Stat Doppler ultrasound for bilateral lower extremities was negative today She should follow with her cardiologist as planned.    Calf pain   Cellulitis and abscess of lower leg       Imaging: Dg Chest 1 View  02/29/2012   *RADIOLOGY REPORT*  Clinical Data: Shortness of breath, wheezing  CHEST - 1 VIEW  Comparison: Portable chest x-ray of 02/27/2012  Findings: Very mild bibasilar linear atelectasis is present.  No definite pneumonia is seen.  The heart is mildly enlarged.  No bony abnormalities seen.  IMPRESSION: Bibasilar linear atelectasis.  No definite pneumonia.  Stable cardiomegaly.   Original  Report Authenticated By: Dwyane Dee, M.D.    Dg Chest Port 1 View  02/27/2012  *RADIOLOGY REPORT*  Clinical Data: Chest pain  PORTABLE CHEST - 1 VIEW  Comparison: 07/08/2010  Findings: Moderate cardiomegaly is stable.  Normal pulmonary vascularity.  Lungs are clear.  No pneumothorax or pleural effusion.  Bi-apical pleural thickening is stable.  IMPRESSION: Cardiomegaly without decompensation.   Original Report Authenticated By: Jolaine Click, M.D.      [facility] Calculation: Score not calculated

## 2012-03-08 NOTE — Progress Notes (Signed)
HPI: [name] is a frail 79 y/o patient of Dr. Diona Browner we are seeing for ongoing assessment and treatment of chronic atrial fibrillation, significant mitral stenosis as a result of rheumatic disease, with respiratory failure and  oxygen dependent dyspnea, who we saw during recent hospitalization for cellulitis. She continues frail and has become extremely depressed, not eating well with the exception of ensure supplements and more tired. She is unable to get out because she cannot carry the large O2 tank with her and is in need of portable tank.   Allergies  Allergen Reactions  . Amitiza (Lubiprostone) Swelling  . Ciprofloxacin Nausea And Vomiting  . Linzess (Linaclotide) Swelling and Rash  . Sulfonamide Derivatives Nausea And Vomiting and Rash    Current Outpatient Prescriptions  Medication Sig Dispense Refill  . ALPRAZolam (XANAX) 0.5 MG tablet Take 0.5 mg by mouth at bedtime as needed. Sleep      . amoxicillin-clavulanate (AUGMENTIN) 875-125 MG per tablet Take 1 tablet by mouth 2 (two) times daily.  14 tablet  0  . chlorthalidone (HYGROTON) 25 MG tablet Take 25 mg by mouth daily.        Marland Kitchen dicyclomine (BENTYL) 10 MG capsule Take 10 mg by mouth daily as needed. Stomach Pain      . diltiazem (CARDIZEM CD) 300 MG 24 hr capsule Take 1 capsule (300 mg total) by mouth daily.  90 capsule  1  . furosemide (LASIX) 20 MG tablet Take 20 mg by mouth as needed. Swelling      . HYDROcodone-acetaminophen (LORTAB) 10-500 MG per tablet Take 1 tablet by mouth every 6 (six) hours as needed. Pain      . hydrocortisone (CORTEF) 10 MG tablet Take 10 mg by mouth daily.      . hydrocortisone cream 1 % Apply topically 2 (two) times daily.  30 g  0  . levothyroxine (SYNTHROID, LEVOTHROID) 125 MCG tablet Take 125 mcg by mouth daily.      . Linaclotide (LINZESS) 145 MCG CAPS Take 1 capsule (145 mcg total) by mouth daily.  30 capsule  1  . pantoprazole (PROTONIX) 40 MG tablet Take 40 mg by mouth 2 (two) times  daily.        . phenazopyridine (PYRIDIUM) 100 MG tablet Take 1 tablet (100 mg total) by mouth 3 (three) times daily with meals.  10 tablet  0  . potassium chloride (KLOR-CON) 10 MEQ CR tablet Take 10 mEq by mouth 3 (three) times daily.       . predniSONE (DELTASONE) 20 MG tablet Take 2 tablets daily for 2 days, one tablet daily for 2 days, half tablet daily for 2 days, then STOP.  7 tablet  0  . Probiotic Product (ALIGN) 4 MG CAPS Take 4 mg by mouth daily.      . propranolol (INDERAL) 80 MG tablet Take 80 mg by mouth daily.        Marland Kitchen warfarin (COUMADIN) 2.5 MG tablet Take 1.25-2.5 mg by mouth daily. Takes a whole tablet everyday except for Mon, Wed, and Fri when patient takes half a tablet.        Past Medical History  Diagnosis Date  . Rheumatic heart disease   . IBS (irritable bowel syndrome)   . GERD (gastroesophageal reflux disease)   . Breast cancer   . Mitral regurgitation   . Mitral stenosis   . Atrial fibrillation   . PAT (paroxysmal atrial tachycardia)   . Hypothyroidism   . Melanoma of  neck   . Microscopic colitis 2003    Was maintained on Asacol BID  . S/P colonoscopy April 2010    Friable anal canal, left-sided diverticula, adenomatous polyp  . S/P endoscopy April 2010    Benign polyp, s/p 54-F Maloney dilation  . Hiatal hernia   . Diverticula of colon   . Tubulovillous adenoma   . Adenomatous polyp     Past Surgical History  Procedure Date  . Cardiac catheterization     NORMAL CORONARY ARTERIES AT CATH IN 2006  . Abdominal hysterectomy   . Mastectomy     RIGHT BREAST, followed by left breast after radiation  . Melanoma removal     SURGERY NECK  . Cataracts   . Hemorrhoid surgery   . Tonsillectomy   . Colonoscopy 06/12/2008    Dr. Audelia Hives anal canal, left-sided diverticula, adenomatous polyp  . Esophagogastroduodenoscopy 06/12/2008    Dr. Larina Bras polyp, s/p 54-F Elease Hashimoto dilation  . Colonoscopy 09/28/2011    Dr. Jena Gauss- tubular adenoma, colonic  diverticulitis    NWG:NFAOZH of systems complete and found to be negative unless listed above  PHYSICAL EXAM BP 104/78  Pulse 89  Ht 5\' 3"  (1.6 m)  Wt 145 lb 1.3 oz (65.808 kg)  BMI 25.70 kg/m2  SpO2 97%  General: Well developed, well nourished, in no acute distress., pale, frail. Head: Eyes PERRLA, No xanthomas.   Normal cephalic and atramatic  Lungs: Clear bilaterally to auscultation and percussion. Heart: HRIR S1 S2, 1/6 systolic murmur at the apex,.  Pulses are 2+ & equal.            Soft carotid bruit. No JVD.  No abdominal bruits. No femoral bruits. Abdomen: Bowel sounds are positive, abdomen soft and non-tender without masses or                  Hernia's noted. Msk:  Back normal, normal gait. Normal strength and tone for age. Extremities: No clubbing, cyanosis or edema. Skin redness noted bilaterally from inflammatory process related to cellulitis.  DP +1 Neuro: Alert and oriented X 3. Psych:  Flat affect, responds appropriately  EKG: Atrial fibrillation rate of 75 bpm.  ASSESSMENT AND PLAN

## 2012-03-08 NOTE — Assessment & Plan Note (Signed)
It is my hope that with portable O2 she will venture out more to help with depression and isolation. I have encouraged her to attend senior center activities as they are very close to her home.

## 2012-03-08 NOTE — Patient Instructions (Addendum)
Your physician recommends that you schedule a follow-up appointment in: 2 MONTHS WITH KL  CONTINUE TO FOLLOW UP WITH MD FUSCO TO MANAGE YOUR COUMADIN, PLEASE HAVE HIS OFFICE FAX YOUR UPCOMING LABS TO OUR OFFICE FOR REVIEW AT [phone]  PLEASE SEEK MEASUREMENT FOR KNEE HIGH TED HOSE AT [facility], FORM HAS BEEN ENCLOSED  WE WILL CONTACT [facility] TO HAVE A PORTABLE TANK BROUGHT OUT TO YOUR HOME

## 2012-03-08 NOTE — Assessment & Plan Note (Addendum)
Heart rate is well controlled but is easily elevated with minimum exertion, especially when she is off O2. No changes to medications at this time. She is to have labs drawn by Dr. Sharyon Medicus office  Will request copies. She will need BMET as well. We will continue to manage her coumadin dosing.

## 2012-03-08 NOTE — Progress Notes (Deleted)
Name: [name]    DOB: 01-24-1930  Age: 79 y.o.  MR#: [id number]       PCP:  Cassell Smiles., MD      Insurance: @PAYORNAME @   CC:    Chief Complaint  Patient presents with  . Follow-up    still having chest pains and irregular heart beat, fatigue    VS BP 104/78  Pulse 89  Ht 5\' 3"  (1.6 m)  Wt 145 lb 1.3 oz (65.808 kg)  BMI 25.70 kg/m2  SpO2 97%  Weights Current Weight  03/08/12 145 lb 1.3 oz (65.808 kg)  02/27/12 150 lb 12.7 oz (68.4 kg)  01/12/12 149 lb 0.6 oz (67.604 kg)    Blood Pressure  BP Readings from Last 3 Encounters:  03/08/12 104/78  03/01/12 136/80  01/12/12 129/85     Admit date:  (Not on file) Last encounter with RMR:  Visit date not found   Allergy Allergies  Allergen Reactions  . Amitiza (Lubiprostone) Swelling  . Ciprofloxacin Nausea And Vomiting  . Linzess (Linaclotide) Swelling and Rash  . Sulfonamide Derivatives Nausea And Vomiting and Rash    Current Outpatient Prescriptions  Medication Sig Dispense Refill  . ALPRAZolam (XANAX) 0.5 MG tablet Take 0.5 mg by mouth at bedtime as needed. Sleep      . amoxicillin-clavulanate (AUGMENTIN) 875-125 MG per tablet Take 1 tablet by mouth 2 (two) times daily.  14 tablet  0  . chlorthalidone (HYGROTON) 25 MG tablet Take 25 mg by mouth daily.        Marland Kitchen dicyclomine (BENTYL) 10 MG capsule Take 10 mg by mouth daily as needed. Stomach Pain      . diltiazem (CARDIZEM CD) 300 MG 24 hr capsule Take 1 capsule (300 mg total) by mouth daily.  90 capsule  1  . furosemide (LASIX) 20 MG tablet Take 20 mg by mouth as needed. Swelling      . HYDROcodone-acetaminophen (LORTAB) 10-500 MG per tablet Take 1 tablet by mouth every 6 (six) hours as needed. Pain      . hydrocortisone (CORTEF) 10 MG tablet Take 10 mg by mouth daily.      . hydrocortisone cream 1 % Apply topically 2 (two) times daily.  30 g  0  . levothyroxine (SYNTHROID, LEVOTHROID) 125 MCG tablet Take 125 mcg by mouth daily.      . Linaclotide (LINZESS) 145  MCG CAPS Take 1 capsule (145 mcg total) by mouth daily.  30 capsule  1  . pantoprazole (PROTONIX) 40 MG tablet Take 40 mg by mouth 2 (two) times daily.        . phenazopyridine (PYRIDIUM) 100 MG tablet Take 1 tablet (100 mg total) by mouth 3 (three) times daily with meals.  10 tablet  0  . potassium chloride (KLOR-CON) 10 MEQ CR tablet Take 10 mEq by mouth 3 (three) times daily.       . predniSONE (DELTASONE) 20 MG tablet Take 2 tablets daily for 2 days, one tablet daily for 2 days, half tablet daily for 2 days, then STOP.  7 tablet  0  . Probiotic Product (ALIGN) 4 MG CAPS Take 4 mg by mouth daily.      . propranolol (INDERAL) 80 MG tablet Take 80 mg by mouth daily.        Marland Kitchen warfarin (COUMADIN) 2.5 MG tablet Take 1.25-2.5 mg by mouth daily. Takes a whole tablet everyday except for Mon, Wed, and Fri when patient takes half a tablet.  Discontinued Meds:   There are no discontinued medications.  Patient Active Problem List  Diagnosis  . Mitral stenosis  . Atrial fibrillation  . DYSPNEA  . Encounter for long-term (current) use of anticoagulants  . Stress fracture of foot with delayed healing  . Pain, foot, chronic  . History of colonic polyps  . Constipation  . Abdominal pain  . Bilateral lower extremity edema  . Calf pain  . Cellulitis and abscess of lower leg    LABS Anti-coag visit on 03/07/2012  Component Date Value  . INR 03/07/2012 2.7   Anti-coag visit on 03/04/2012  Component Date Value  . INR 03/04/2012 3.4   Admission on 02/27/2012, Discharged on 03/01/2012  Component Date Value  . WBC 02/27/2012 TEST RESCHEDULED   . RBC 02/27/2012 TEST RESCHEDULED   . Hemoglobin 02/27/2012 TEST RESCHEDULED   . HCT 02/27/2012 TEST RESCHEDULED   . MCV 02/27/2012 TEST RESCHEDULED   . [facility] 02/27/2012 TEST RESCHEDULED   . MCHC 02/27/2012 TEST RESCHEDULED   . RDW 02/27/2012 TEST RESCHEDULED   . Platelets 02/27/2012 TEST RESCHEDULED   . Neutrophils Relative 02/27/2012 PENDING   .  Neutro Abs 02/27/2012 PENDING   . Band Neutrophils 02/27/2012 PENDING   . Lymphocytes Relative 02/27/2012 PENDING   . Lymphs Abs 02/27/2012 PENDING   . Monocytes Relative 02/27/2012 PENDING   . Monocytes Absolute 02/27/2012 PENDING   . Eosinophils Relative 02/27/2012 PENDING   . Eosinophils Absolute 02/27/2012 PENDING   . Basophils Relative 02/27/2012 PENDING   . Basophils Absolute 02/27/2012 PENDING   . LUCs, % 02/27/2012 PENDING   . LUC, Absolute 02/27/2012 PENDING   . WBC Morphology 02/27/2012 PENDING   . RBC Morphology 02/27/2012 PENDING   . Smear Review 02/27/2012 PENDING   . Other 02/27/2012 PENDING   . Other 2 02/27/2012 PENDING   . nRBC 02/27/2012 PENDING   . Metamyelocytes Relative 02/27/2012 PENDING   . Myelocytes 02/27/2012 PENDING   . Promyelocytes Absolute 02/27/2012 PENDING   . Blasts 02/27/2012 PENDING   . Color, Urine 02/27/2012 YELLOW   . APPearance 02/27/2012 CLEAR   . Specific Gravity, Urine 02/27/2012 1.015   . pH 02/27/2012 6.0   . Glucose, UA 02/27/2012 NEGATIVE   . Hgb urine dipstick 02/27/2012 NEGATIVE   . Bilirubin Urine 02/27/2012 NEGATIVE   . Ketones, ur 02/27/2012 NEGATIVE   . Protein, ur 02/27/2012 NEGATIVE   . Urobilinogen, UA 02/27/2012 0.2   . Nitrite 02/27/2012 NEGATIVE   . Leukocytes, UA 02/27/2012 NEGATIVE   . WBC 02/27/2012 7.0   . RBC 02/27/2012 4.16   . Hemoglobin 02/27/2012 13.0   . HCT 02/27/2012 38.7   . MCV 02/27/2012 93.0   . [facility] 02/27/2012 31.3   . MCHC 02/27/2012 33.6   . RDW 02/27/2012 14.1   . Platelets 02/27/2012 255   . Neutrophils Relative 02/27/2012 63   . Neutro Abs 02/27/2012 4.4   . Lymphocytes Relative 02/27/2012 27   . Lymphs Abs 02/27/2012 1.9   . Monocytes Relative 02/27/2012 7   . Monocytes Absolute 02/27/2012 0.5   . Eosinophils Relative 02/27/2012 3   . Eosinophils Absolute 02/27/2012 0.2   . Basophils Relative 02/27/2012 0   . Basophils Absolute 02/27/2012 0.0   . Sodium 02/27/2012 133*  . Potassium  02/27/2012 2.8*  . Chloride 02/27/2012 93*  . CO2 02/27/2012 28   . Glucose, Bld 02/27/2012 94   . BUN 02/27/2012 24*  . Creatinine, Ser 02/27/2012 0.84   . Calcium 02/27/2012 9.4   .  Total Protein 02/27/2012 7.7   . Albumin 02/27/2012 3.5   . AST 02/27/2012 25   . ALT 02/27/2012 12   . Alkaline Phosphatase 02/27/2012 80   . Total Bilirubin 02/27/2012 1.0   . GFR calc non Af Amer 02/27/2012 64*  . GFR calc Af Amer 02/27/2012 74*  . Lipase 02/27/2012 17   . Prothrombin Time 02/27/2012 26.6*  . INR 02/27/2012 2.60*  . Troponin I 02/27/2012 <0.30   . Sodium 02/28/2012 138   . Potassium 02/28/2012 3.4*  . Chloride 02/28/2012 101   . CO2 02/28/2012 28   . Glucose, Bld 02/28/2012 116*  . BUN 02/28/2012 21   . Creatinine, Ser 02/28/2012 0.85   . Calcium 02/28/2012 8.3*  . Total Protein 02/28/2012 6.2   . Albumin 02/28/2012 2.7*  . AST 02/28/2012 20   . ALT 02/28/2012 10   . Alkaline Phosphatase 02/28/2012 62   . Total Bilirubin 02/28/2012 0.5   . GFR calc non Af Amer 02/28/2012 63*  . GFR calc Af Amer 02/28/2012 73*  . Prothrombin Time 02/28/2012 27.6*  . INR 02/28/2012 2.73*  . WBC 02/28/2012 6.0   . RBC 02/28/2012 3.66*  . Hemoglobin 02/28/2012 11.5*  . HCT 02/28/2012 34.1*  . MCV 02/28/2012 93.2   . [facility] 02/28/2012 31.4   . MCHC 02/28/2012 33.7   . RDW 02/28/2012 14.2   . Platelets 02/28/2012 249   . Color, Urine 02/28/2012 ORANGE*  . APPearance 02/28/2012 CLEAR   . Specific Gravity, Urine 02/28/2012 1.025   . pH 02/28/2012 5.0   . Glucose, UA 02/28/2012 100*  . Hgb urine dipstick 02/28/2012 NEGATIVE   . Bilirubin Urine 02/28/2012 NEGATIVE   . Ketones, ur 02/28/2012 15*  . Protein, ur 02/28/2012 30*  . Urobilinogen, UA 02/28/2012 4.0*  . Nitrite 02/28/2012 POSITIVE*  . Leukocytes, UA 02/28/2012 SMALL*  . Squamous Epithelial / LPF 02/28/2012 FEW*  . WBC, UA 02/28/2012 TOO NUMEROUS TO COUNT   . Bacteria, UA 02/28/2012 FEW*  . Prothrombin Time 02/29/2012 31.7*  .  INR 02/29/2012 3.30*  . WBC 02/29/2012 9.2   . RBC 02/29/2012 4.14   . Hemoglobin 02/29/2012 13.2   . HCT 02/29/2012 39.6   . MCV 02/29/2012 95.7   . [facility] 02/29/2012 31.9   . MCHC 02/29/2012 33.3   . RDW 02/29/2012 14.8   . Platelets 02/29/2012 224   . Sodium 02/29/2012 138   . Potassium 02/29/2012 3.8   . Chloride 02/29/2012 101   . CO2 02/29/2012 27   . Glucose, Bld 02/29/2012 118*  . BUN 02/29/2012 18   . Creatinine, Ser 02/29/2012 0.78   . Calcium 02/29/2012 8.5   . Total Protein 02/29/2012 6.5   . Albumin 02/29/2012 2.9*  . AST 02/29/2012 31   . ALT 02/29/2012 16   . Alkaline Phosphatase 02/29/2012 70   . Total Bilirubin 02/29/2012 0.8   . GFR calc non Af Amer 02/29/2012 77*  . GFR calc Af Amer 02/29/2012 89*  . Specimen Description 02/28/2012 URINE, CLEAN CATCH   . Special Requests 02/28/2012 NONE   . Culture  Setup Time 02/28/2012 02/29/2012 02:02   . Colony Count 02/28/2012 2,000 COLONIES/ML   . Culture 02/28/2012 INSIGNIFICANT GROWTH   . Report Status 02/28/2012 02/29/2012 FINAL   . Vancomycin Rm 02/29/2012 10.8   . Prothrombin Time 03/01/2012 32.1*  . INR 03/01/2012 3.35*  . WBC 03/01/2012 7.5   . RBC 03/01/2012 3.89   . Hemoglobin 03/01/2012 12.1   . HCT  03/01/2012 36.4   . MCV 03/01/2012 93.6   . [facility] 03/01/2012 31.1   . MCHC 03/01/2012 33.2   . RDW 03/01/2012 14.5   . Platelets 03/01/2012 247   Anti-coag visit on 02/26/2012  Component Date Value  . INR 02/26/2012 3.4   Office Visit on 01/12/2012  Component Date Value  . INR 01/12/2012 2.9      Results for this Opt Visit:     Results for orders placed in visit on 03/07/12  POCT INR      Component Value Range   INR 2.7      EKG Orders placed in visit on 03/08/12  . EKG 12-LEAD     Prior Assessment and Plan Problem List as of 03/08/2012          Mitral stenosis   Last Assessment & Plan Note   01/12/2012 Office Visit Signed 01/12/2012  1:17 PM by Jonelle Sidle, MD    Overall mild by her  last echocardiogram. Continue symptomatic followup for now.    Atrial fibrillation   Last Assessment & Plan Note   01/12/2012 Office Visit Signed 01/12/2012  1:16 PM by Jonelle Sidle, MD    Permanent, stable symptomatically on current dose of Cardizem CD. PT/INR was 2.9 today on Coumadin. Importance of regular followup in the Coumadin clinic was reinforced, and a visit was made. Otherwise refill for Cardizem CD 300 mg daily, 3 month supply. Follow up arranged.    DYSPNEA   Last Assessment & Plan Note   06/15/2011 Office Visit Signed 06/15/2011  9:33 AM by Jonelle Sidle, MD    Plan as noted above.    Encounter for long-term (current) use of anticoagulants   Stress fracture of foot with delayed healing   Pain, foot, chronic   History of colonic polyps   Constipation   Last Assessment & Plan Note   01/10/2012 Office Visit Addendum 01/11/2012 11:15 AM by Joselyn Arrow, NP    Chronic constipation.  She is doing well on linzess daily.    Continue Linzess daily for constipation.  She is advised to follow up with Dr Sherwood Gambler about abnormal creatinine on last lab     Abdominal pain   Last Assessment & Plan Note   01/10/2012 Office Visit Addendum 01/10/2012  1:21 PM by Joselyn Arrow, NP    CT scan reassuring. Recent LFTs normal. She does have history of Gilbert's. I suspect her chronic abdominal pain is caused by chronic constipation worsened with narcotics. She has had good results with Linzess & should continue this regimen.      Bilateral lower extremity edema   Last Assessment & Plan Note   01/10/2012 Office Visit Addendum 01/11/2012 11:16 AM by Joselyn Arrow, NP    Left greater than right lower extremity edema along with calf pain and erythema of left calf.   Stat Doppler ultrasound for bilateral lower extremities was negative today She should follow with her cardiologist as planned.    Calf pain   Cellulitis and abscess of lower leg       Imaging: Dg  Chest 1 View  02/29/2012  *RADIOLOGY REPORT*  Clinical Data: Shortness of breath, wheezing  CHEST - 1 VIEW  Comparison: Portable chest x-ray of 02/27/2012  Findings: Very mild bibasilar linear atelectasis is present.  No definite pneumonia is seen.  The heart is mildly enlarged.  No bony abnormalities seen.  IMPRESSION: Bibasilar linear atelectasis.  No definite pneumonia.  Stable  cardiomegaly.   Original Report Authenticated By: Dwyane Dee, M.D.    Dg Chest Port 1 View  02/27/2012  *RADIOLOGY REPORT*  Clinical Data: Chest pain  PORTABLE CHEST - 1 VIEW  Comparison: 07/08/2010  Findings: Moderate cardiomegaly is stable.  Normal pulmonary vascularity.  Lungs are clear.  No pneumothorax or pleural effusion.  Bi-apical pleural thickening is stable.  IMPRESSION: Cardiomegaly without decompensation.   Original Report Authenticated By: Jolaine Click, M.D.      [facility] Calculation: Score not calculated

## 2012-03-11 ENCOUNTER — Ambulatory Visit (INDEPENDENT_AMBULATORY_CARE_PROVIDER_SITE_OTHER): Payer: Medicare Other | Admitting: *Deleted

## 2012-03-11 ENCOUNTER — Telehealth: Payer: Self-pay | Admitting: *Deleted

## 2012-03-11 DIAGNOSIS — Z7901 Long term (current) use of anticoagulants: Secondary | ICD-10-CM

## 2012-03-11 DIAGNOSIS — I4891 Unspecified atrial fibrillation: Secondary | ICD-10-CM

## 2012-03-11 LAB — POCT INR: INR: 2.1

## 2012-03-11 NOTE — Telephone Encounter (Signed)
PT - 25.6 and INR - 2.1 / please call with instructions. / tgs

## 2012-03-11 NOTE — Telephone Encounter (Signed)
See coumadin note. 

## 2012-03-18 ENCOUNTER — Ambulatory Visit (INDEPENDENT_AMBULATORY_CARE_PROVIDER_SITE_OTHER): Payer: Medicare Other | Admitting: *Deleted

## 2012-03-18 DIAGNOSIS — Z7901 Long term (current) use of anticoagulants: Secondary | ICD-10-CM

## 2012-03-18 DIAGNOSIS — I4891 Unspecified atrial fibrillation: Secondary | ICD-10-CM

## 2012-03-22 ENCOUNTER — Ambulatory Visit (INDEPENDENT_AMBULATORY_CARE_PROVIDER_SITE_OTHER): Payer: Medicare Other | Admitting: *Deleted

## 2012-03-22 DIAGNOSIS — I4891 Unspecified atrial fibrillation: Secondary | ICD-10-CM

## 2012-03-22 DIAGNOSIS — Z7901 Long term (current) use of anticoagulants: Secondary | ICD-10-CM

## 2012-03-29 ENCOUNTER — Ambulatory Visit (INDEPENDENT_AMBULATORY_CARE_PROVIDER_SITE_OTHER): Payer: Medicare Other | Admitting: *Deleted

## 2012-03-29 DIAGNOSIS — I4891 Unspecified atrial fibrillation: Secondary | ICD-10-CM

## 2012-03-29 DIAGNOSIS — Z7901 Long term (current) use of anticoagulants: Secondary | ICD-10-CM

## 2012-04-01 ENCOUNTER — Telehealth: Payer: Self-pay | Admitting: Adult Health

## 2012-04-01 NOTE — Telephone Encounter (Signed)
Yes, please order overnight pulse oximetry.

## 2012-04-01 NOTE — Telephone Encounter (Signed)
Called [facility] to clarify if pt insurance will require an order for overnight oximetry in order for the pt insurance to pay for pt need for oxygen, spoke with Larita Fife and was advised that the support center would best review the pt needs, with Tiffany whom advised the pt insurance WILL pay for overnight pulse oximetry to be faxed to [phone], please advise if ok to place order for pt

## 2012-04-01 NOTE — Telephone Encounter (Signed)
PT STATES THAT KATHRYN TOLD HER SHE COULD HELP WITH GETTING OXYGEN. SHE STATES THAT HER INSURANCE WILL NOT PAY FOR IT

## 2012-04-02 NOTE — Telephone Encounter (Signed)
Order written on prescription pad, signed by KL and faxed to [facility], called pt to advise that someone will contact her from [facility] concerning the study to assist with receiving oxygen and that her insurance will cover this study according to [facility] rep

## 2012-04-02 NOTE — Telephone Encounter (Signed)
Pt understood all instructions, manually faxed order to [facility], pt will call office back if any further assistance needed

## 2012-04-08 ENCOUNTER — Telehealth: Payer: Self-pay | Admitting: Adult Health

## 2012-04-08 ENCOUNTER — Ambulatory Visit (INDEPENDENT_AMBULATORY_CARE_PROVIDER_SITE_OTHER): Payer: Medicare Other | Admitting: *Deleted

## 2012-04-08 DIAGNOSIS — I4891 Unspecified atrial fibrillation: Secondary | ICD-10-CM

## 2012-04-08 DIAGNOSIS — Z7901 Long term (current) use of anticoagulants: Secondary | ICD-10-CM

## 2012-04-08 NOTE — Telephone Encounter (Signed)
INR 2.1

## 2012-04-08 NOTE — Telephone Encounter (Signed)
See coumadin note. 

## 2012-04-18 ENCOUNTER — Ambulatory Visit (INDEPENDENT_AMBULATORY_CARE_PROVIDER_SITE_OTHER): Payer: Medicare Other | Admitting: *Deleted

## 2012-04-18 DIAGNOSIS — Z7901 Long term (current) use of anticoagulants: Secondary | ICD-10-CM

## 2012-04-18 DIAGNOSIS — I4891 Unspecified atrial fibrillation: Secondary | ICD-10-CM

## 2012-04-24 ENCOUNTER — Ambulatory Visit (INDEPENDENT_AMBULATORY_CARE_PROVIDER_SITE_OTHER): Payer: Medicare Other | Admitting: *Deleted

## 2012-04-24 DIAGNOSIS — I4891 Unspecified atrial fibrillation: Secondary | ICD-10-CM

## 2012-04-24 DIAGNOSIS — Z7901 Long term (current) use of anticoagulants: Secondary | ICD-10-CM

## 2012-04-24 LAB — POCT INR: INR: 1.8

## 2012-06-03 ENCOUNTER — Other Ambulatory Visit: Payer: Self-pay | Admitting: Urgent Care

## 2012-06-03 ENCOUNTER — Other Ambulatory Visit: Payer: Self-pay | Admitting: Gastroenterology

## 2012-06-03 NOTE — Telephone Encounter (Signed)
We have received a request for Linzess but it is listed on her list of allergies with reaction of swelling and rash. Please clarify.

## 2012-06-03 NOTE — Telephone Encounter (Signed)
Pt said that she does not think it is the linzess. She said that the Linzess is working and would like to stay on.

## 2012-06-04 ENCOUNTER — Telehealth: Payer: Self-pay

## 2012-06-04 NOTE — Telephone Encounter (Signed)
Looks like Verlon Au already refilled.

## 2012-06-04 NOTE — Telephone Encounter (Signed)
done

## 2012-06-04 NOTE — Telephone Encounter (Signed)
Bonita Quin called from [place] and said they have faxed a request a couple of times for this pt to get refills on her Linzess and have not received it. Please send refill to the pharmacy!

## 2012-06-11 ENCOUNTER — Other Ambulatory Visit: Payer: Self-pay | Admitting: Cardiology

## 2012-06-18 IMAGING — CR DG CHEST 2V
2 series · 2 of 2 positions shown · non-contrast
Comparison: 02/12/2010 and 10/31/2004.

CLINICAL DATA: Abnormal lab value - problem with kidneys.

CHEST - 2 VIEW

[view not recorded (1 of 2)]
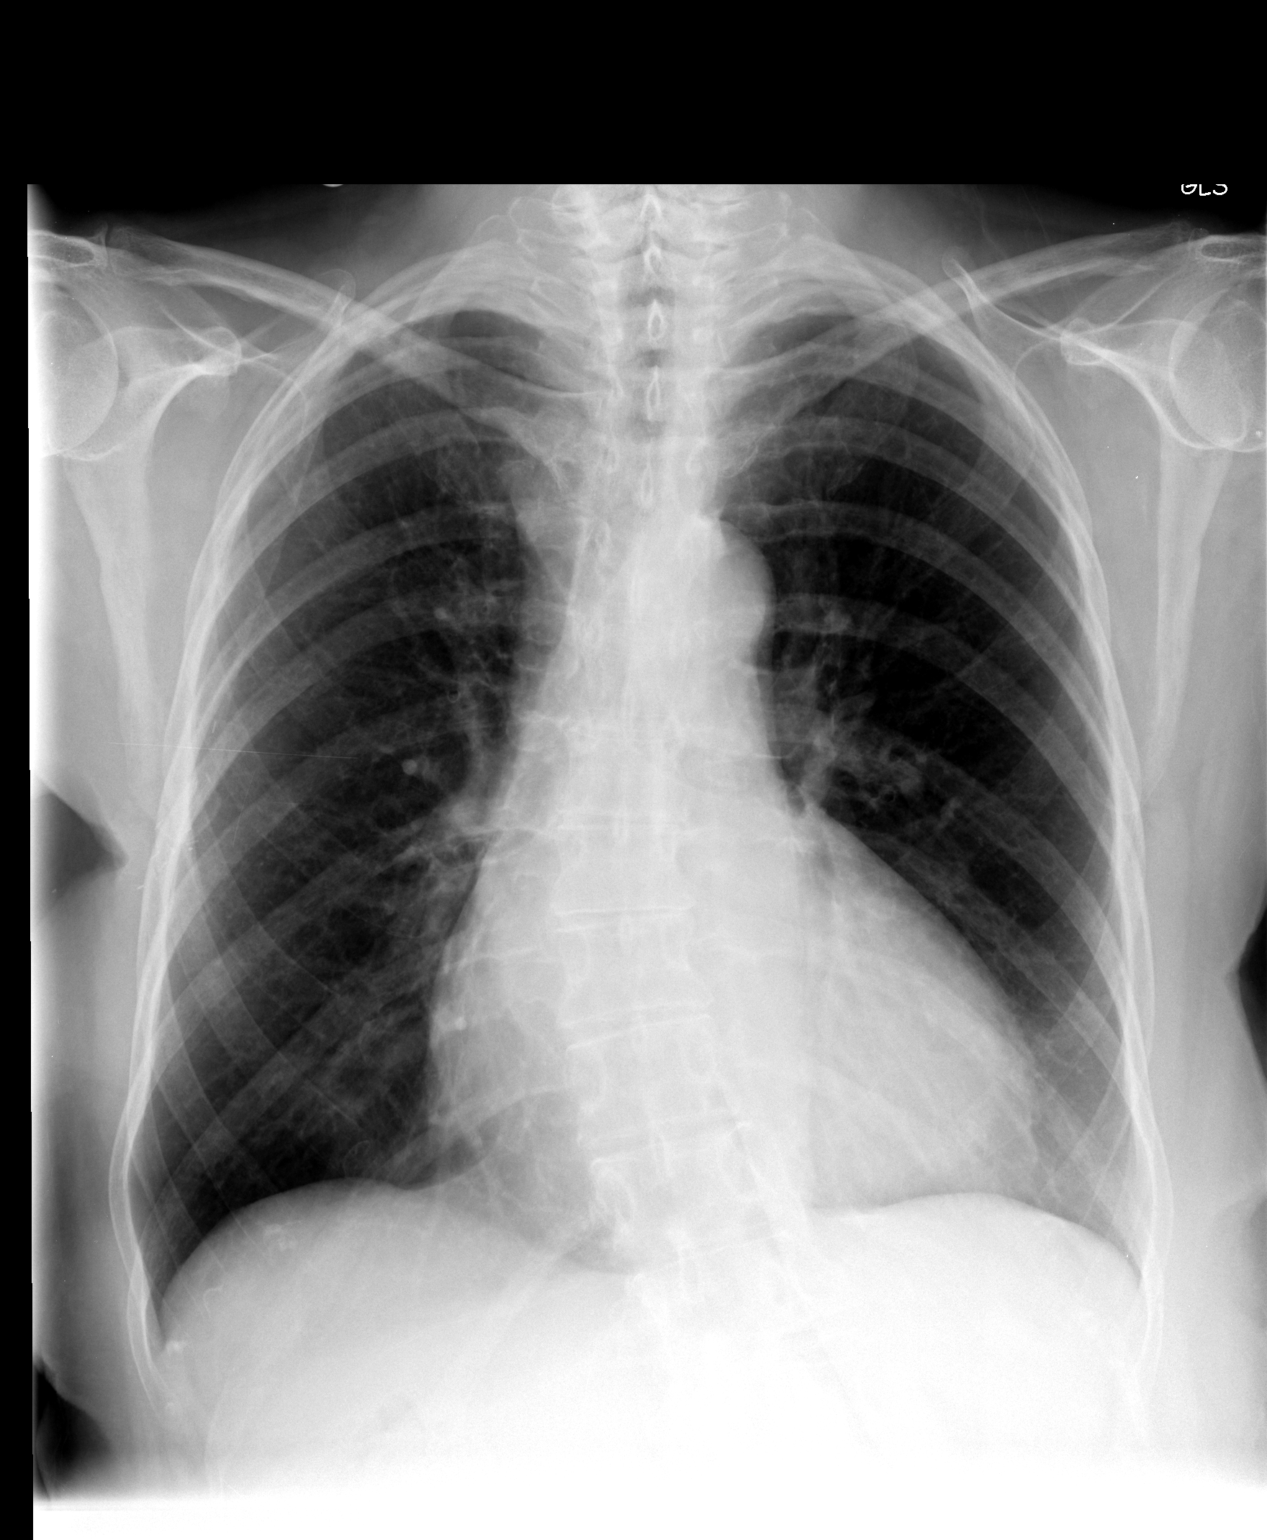

[view not recorded (2 of 2)]
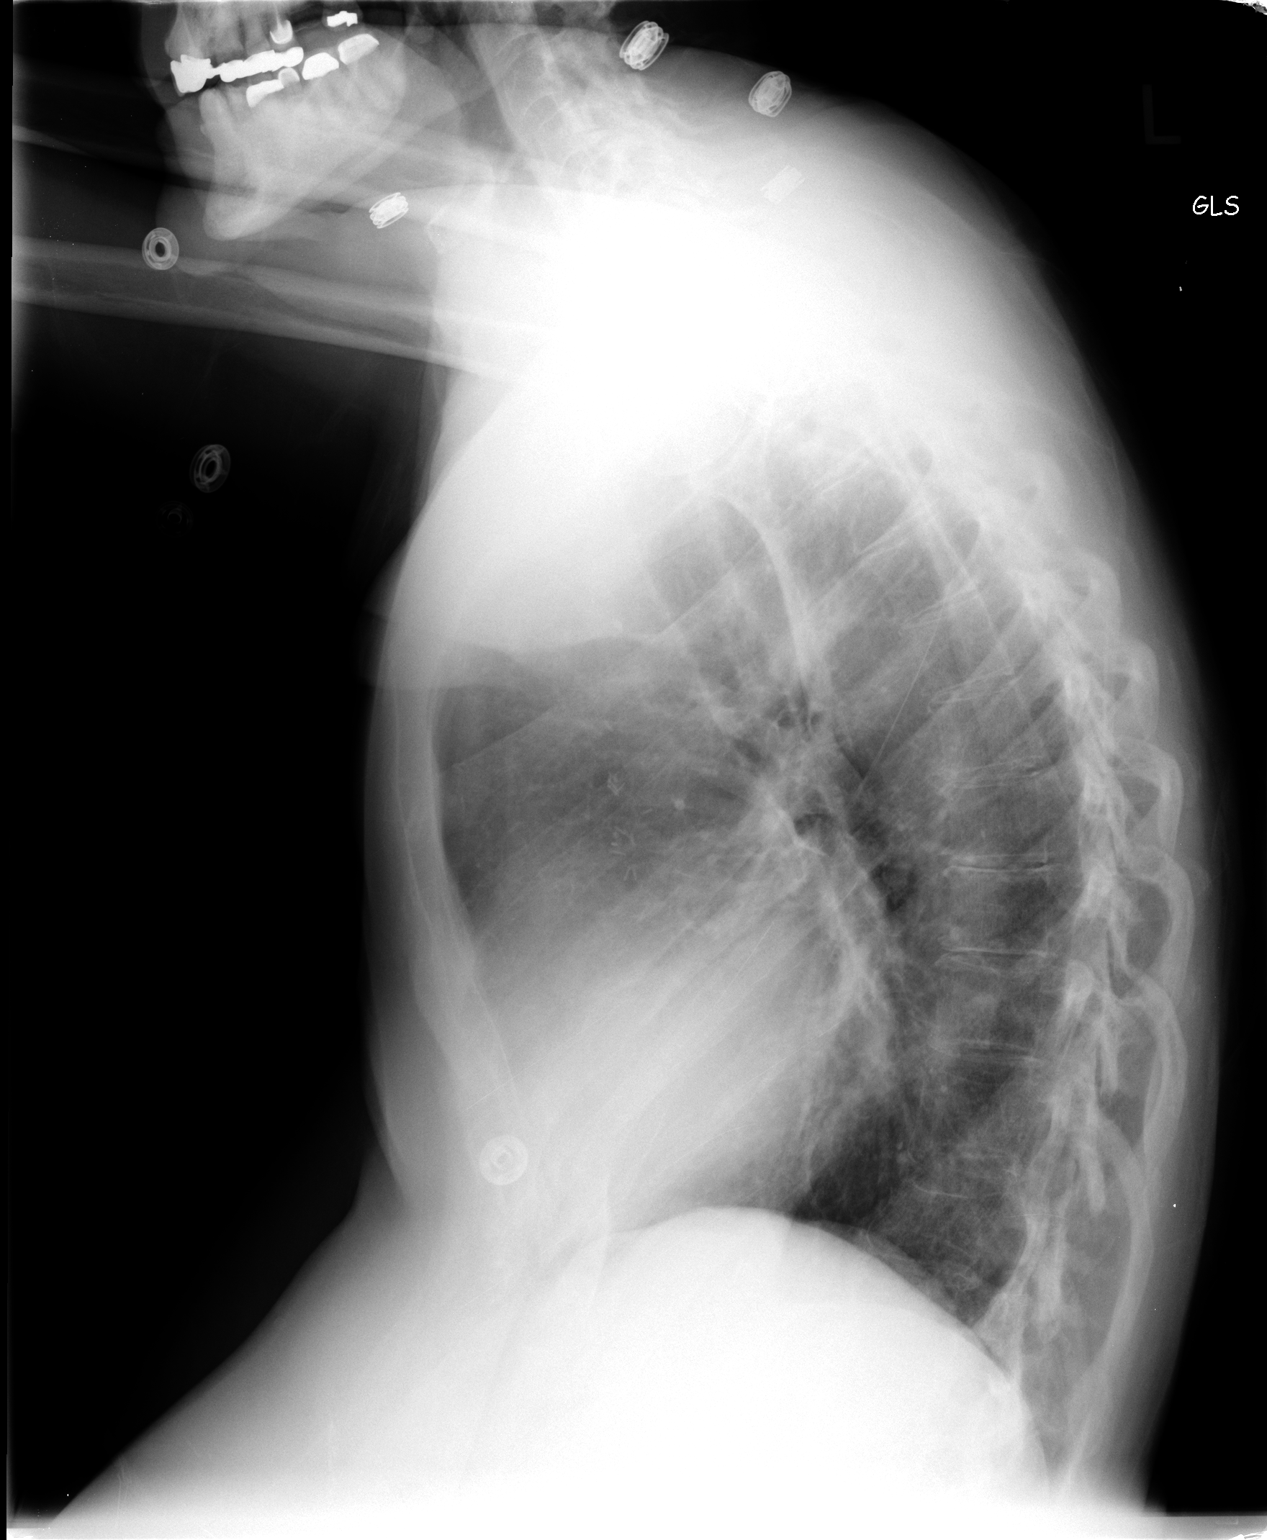

[2 of 2 positions shown; findings below may reference images not displayed]

FINDINGS: There is stable enlargement of the cardiac silhouette,
exacerbated by a pectus deformity.  Mediastinal contours are
stable.  The lungs are clear.  There is no edema or pleural
effusion.  There is a moderate thoracolumbar scoliosis.  Surgical
clips are present within the right axilla and right upper quadrant
of the abdomen.
IMPRESSION: Stable examination with mild cardiomegaly and scoliosis.  No acute
cardiopulmonary process.

## 2012-07-09 ENCOUNTER — Other Ambulatory Visit: Payer: Self-pay | Admitting: Cardiology

## 2012-07-10 ENCOUNTER — Other Ambulatory Visit: Payer: Self-pay | Admitting: *Deleted

## 2012-07-10 MED ORDER — DILTIAZEM HCL ER COATED BEADS 300 MG PO CP24: 300.0000 mg | ORAL_CAPSULE | Freq: Every day | ORAL | Status: DC

## 2012-07-12 NOTE — Telephone Encounter (Signed)
Pt scheduled f/u for LR with coumadin clinic on 07-18-12 only received one week supply per pt overdue since march, pt understood rx sent to pharmacy by e-script for #7 with no refills

## 2012-07-18 ENCOUNTER — Ambulatory Visit (INDEPENDENT_AMBULATORY_CARE_PROVIDER_SITE_OTHER): Payer: Medicare Other | Admitting: *Deleted

## 2012-07-18 DIAGNOSIS — Z7901 Long term (current) use of anticoagulants: Secondary | ICD-10-CM

## 2012-07-18 DIAGNOSIS — I4891 Unspecified atrial fibrillation: Secondary | ICD-10-CM

## 2012-07-18 LAB — POCT INR: INR: 2.6

## 2012-07-24 ENCOUNTER — Other Ambulatory Visit (HOSPITAL_COMMUNITY): Payer: Self-pay | Admitting: Internal Medicine

## 2012-07-24 DIAGNOSIS — R234 Changes in skin texture: Secondary | ICD-10-CM

## 2012-08-07 ENCOUNTER — Ambulatory Visit (HOSPITAL_COMMUNITY)
Admission: RE | Admit: 2012-08-07 | Discharge: 2012-08-07 | Disposition: A | Discharge status: Home / Self Care | Payer: Medicare Other | Source: Ambulatory Visit | Attending: Internal Medicine | Admitting: Internal Medicine

## 2012-08-07 DIAGNOSIS — Z853 Personal history of malignant neoplasm of breast: Secondary | ICD-10-CM | POA: Insufficient documentation

## 2012-08-07 DIAGNOSIS — R234 Changes in skin texture: Secondary | ICD-10-CM

## 2012-08-12 ENCOUNTER — Ambulatory Visit (INDEPENDENT_AMBULATORY_CARE_PROVIDER_SITE_OTHER): Payer: Medicare Other | Admitting: Cardiology

## 2012-08-12 ENCOUNTER — Encounter: Payer: Self-pay | Admitting: Cardiology

## 2012-08-12 ENCOUNTER — Ambulatory Visit (INDEPENDENT_AMBULATORY_CARE_PROVIDER_SITE_OTHER): Payer: Medicare Other | Admitting: *Deleted

## 2012-08-12 VITALS — BP 122/69 | HR 69 | Ht 63.0 in | Wt 134.2 lb

## 2012-08-12 DIAGNOSIS — Z7901 Long term (current) use of anticoagulants: Secondary | ICD-10-CM

## 2012-08-12 DIAGNOSIS — I359 Nonrheumatic aortic valve disorder, unspecified: Secondary | ICD-10-CM | POA: Insufficient documentation

## 2012-08-12 DIAGNOSIS — R600 Localized edema: Secondary | ICD-10-CM | POA: Insufficient documentation

## 2012-08-12 DIAGNOSIS — R609 Edema, unspecified: Secondary | ICD-10-CM

## 2012-08-12 DIAGNOSIS — I4891 Unspecified atrial fibrillation: Secondary | ICD-10-CM

## 2012-08-12 DIAGNOSIS — I059 Rheumatic mitral valve disease, unspecified: Secondary | ICD-10-CM

## 2012-08-12 LAB — POCT INR: INR: 4

## 2012-08-12 MED ORDER — DILTIAZEM HCL ER COATED BEADS 300 MG PO CP24: 300.0000 mg | ORAL_CAPSULE | Freq: Every day | ORAL | Status: DC

## 2012-08-12 NOTE — Patient Instructions (Addendum)
Your physician recommends that you schedule a follow-up appointment in: 3 months  Your physician has recommended you make the following change in your medication:   1) START TAKING YOUR LASIX THREE TIMES WEEKLY, Monday, Wednesday AND Friday

## 2012-08-12 NOTE — Assessment & Plan Note (Signed)
Mild to moderate regurgitation.

## 2012-08-12 NOTE — Assessment & Plan Note (Signed)
Permanent, continue strategy of heart rate control and anticoagulation. 

## 2012-08-12 NOTE — Assessment & Plan Note (Signed)
Chronic, recurring problem. She admits that she is not compliant with Lasix. I have asked her to try and take at least 2-3 days a week if she can, as I suspect this will help her volume status significantly.

## 2012-08-12 NOTE — Assessment & Plan Note (Signed)
Mild stenosis and regurgitation.

## 2012-08-12 NOTE — Progress Notes (Signed)
Clinical Summary Ms. Bold is a medically complex 79 y.o.female seen most recently by Ms. Lawrence NP in January of this year. She reports no major progression in shortness of breath, does get fatigued when she does housework. No reported sense of rapid palpitations. She complains of chronic leg swelling, evident today, although admits that she does not take her Lasix regularly. States that she might go "one-month" without it. Her weight is actually down from the last visit.  Echocardiogram from May 2013 demonstrated mild basal septal hypertrophy with LVEF 55-60%, mild to moderate aortic regurgitation, restricted posterior mitral leaflet motion with mild stenosis and mild regurgitation, moderately dilated left atrium and right atrium.  ECG today shows atrial fibrillation with borderline low voltage, NSST changes.  Call placed to patient's pharmacy by nursing to verify medications.  Allergies  Allergen Reactions  . Amitiza (Lubiprostone) Swelling  . Ciprofloxacin Nausea And Vomiting  . Sulfonamide Derivatives Nausea And Vomiting and Rash    Current Outpatient Prescriptions  Medication Sig Dispense Refill  . ALPRAZolam (XANAX) 0.5 MG tablet Take 0.5 mg by mouth at bedtime as needed. Sleep      . chlorthalidone (HYGROTON) 25 MG tablet Take 25 mg by mouth daily.        Marland Kitchen diltiazem (CARDIZEM CD) 300 MG 24 hr capsule Take 1 capsule (300 mg total) by mouth daily.  90 capsule  1  . furosemide (LASIX) 20 MG tablet Take 20 mg by mouth 3 (three) times a week. Swelling      . HYDROcodone-acetaminophen (LORTAB) 10-500 MG per tablet Take 1 tablet by mouth every 6 (six) hours as needed. Pain      . hydrocortisone (CORTEF) 10 MG tablet Take 10 mg by mouth daily.      . hydrocortisone cream 1 % Apply topically 2 (two) times daily.  30 g  0  . levothyroxine (SYNTHROID, LEVOTHROID) 125 MCG tablet Take 125 mcg by mouth daily.      . pantoprazole (PROTONIX) 40 MG tablet Take 40 mg by mouth 2 (two) times  daily.        . phenazopyridine (PYRIDIUM) 100 MG tablet Take 1 tablet (100 mg total) by mouth 3 (three) times daily with meals.  10 tablet  0  . potassium chloride SA (K-DUR,KLOR-CON) 20 MEQ tablet Take 10 mEq by mouth 3 (three) times daily.       . Probiotic Product (ALIGN) 4 MG CAPS Take 4 mg by mouth daily.      . propranolol (INDERAL) 80 MG tablet Take 80 mg by mouth daily.        Marland Kitchen warfarin (COUMADIN) 2.5 MG tablet TAKE 1 TABLET BY MOUTH DAILY EXCEPT 1/2 TABLET ON MONDAY,WEDNESDAY,& FRIDAY.  7 tablet  0  . amoxicillin-clavulanate (AUGMENTIN) 875-125 MG per tablet Take 1 tablet by mouth 2 (two) times daily.  14 tablet  0  . dicyclomine (BENTYL) 10 MG capsule Take 10 mg by mouth daily as needed. Stomach Pain      . LINZESS 145 MCG CAPS TAKE ONE CAPSULE BY MOUTH EVERY DAY FOR CONSTIPATION.  30 capsule  5  . oxyCODONE-acetaminophen (PERCOCET/ROXICET) 5-325 MG per tablet        No current facility-administered medications for this visit.    Past Medical History  Diagnosis Date  . Rheumatic heart disease   . IBS (irritable bowel syndrome)   . GERD (gastroesophageal reflux disease)   . Breast cancer   . Mitral regurgitation   . Mitral stenosis   .  Atrial fibrillation   . PAT (paroxysmal atrial tachycardia)   . Hypothyroidism   . Melanoma of neck   . Microscopic colitis 2003    Was maintained on Asacol BID  . S/P colonoscopy April 2010    Friable anal canal, left-sided diverticula, adenomatous polyp  . S/P endoscopy April 2010    Benign polyp, s/p 54-F Maloney dilation  . Hiatal hernia   . Diverticula of colon   . Tubulovillous adenoma   . Adenomatous polyp     Social History Ms. Penning reports that she has never smoked. She has never used smokeless tobacco. Ms. Blue reports that she does not drink alcohol.  Review of Systems Poor appetite. No orthopnea or PND. Otherwise as outlined.  Physical Examination Filed Vitals:   08/12/12 0924  BP: 122/69  Pulse: 69   Filed  Weights   08/12/12 0924  Weight: 134 lb 4 oz (60.895 kg)    Elderly woman, comfortable at rest.  HEENT: Conjunctiva and lids are normal, oropharynx clear.  Neck: Supple, no elevated jugular venous pressure, no carotid bruits. No thyromegaly.  Lungs: Diminished breath sounds, no rales, nonlabored.  Cardiac: Irregularly irregular, 2/6 apical systolic murmur, no S3 gallop, no pericardial rub.  Abdomen: Soft, nontender, bowel sounds present.  Extremities: 1-2+ edema, distal pulses one plus.  Neuropsychiatric: Alert oriented x3, affect appropriate.  Skin: Warm and dry. Excoriations where she has scratched her legs. Musculoskeletal: Kyphosis noted.   Problem List and Plan   Atrial fibrillation Permanent, continue strategy of heart rate control and anticoagulation.  Leg edema Chronic, recurring problem. She admits that she is not compliant with Lasix. I have asked her to try and take at least 2-3 days a week if she can, as I suspect this will help her volume status significantly.  Mitral valve disease Mild stenosis and regurgitation.  Aortic valve disease Mild to moderate regurgitation.    Jonelle Sidle, M.D., F.A.C.C.

## 2012-09-26 ENCOUNTER — Other Ambulatory Visit (HOSPITAL_COMMUNITY): Payer: Self-pay | Admitting: Internal Medicine

## 2012-09-26 DIAGNOSIS — Z0000 Encounter for general adult medical examination without abnormal findings: Secondary | ICD-10-CM

## 2012-09-27 ENCOUNTER — Other Ambulatory Visit (HOSPITAL_COMMUNITY): Payer: Self-pay | Admitting: Internal Medicine

## 2012-09-27 DIAGNOSIS — Z79899 Other long term (current) drug therapy: Secondary | ICD-10-CM

## 2012-10-02 ENCOUNTER — Ambulatory Visit (HOSPITAL_COMMUNITY)
Admission: RE | Admit: 2012-10-02 | Discharge: 2012-10-02 | Disposition: A | Discharge status: Home / Self Care | Payer: Medicare Other | Source: Ambulatory Visit | Attending: Internal Medicine | Admitting: Internal Medicine

## 2012-10-02 DIAGNOSIS — Z79899 Other long term (current) drug therapy: Secondary | ICD-10-CM

## 2012-10-02 DIAGNOSIS — M810 Age-related osteoporosis without current pathological fracture: Secondary | ICD-10-CM | POA: Insufficient documentation

## 2012-10-28 ENCOUNTER — Other Ambulatory Visit: Payer: Self-pay | Admitting: Cardiology

## 2012-10-28 NOTE — Telephone Encounter (Signed)
warfarin (COUMADIN) 2.5 MG tablet   Patient calling for script refill.

## 2012-10-28 NOTE — Telephone Encounter (Signed)
Pt calling again to see if warfrin was called in yet

## 2012-11-13 ENCOUNTER — Ambulatory Visit: Payer: Medicare Other | Admitting: Cardiology

## 2012-11-21 ENCOUNTER — Ambulatory Visit (INDEPENDENT_AMBULATORY_CARE_PROVIDER_SITE_OTHER): Payer: Medicare Other | Admitting: Cardiology

## 2012-11-21 ENCOUNTER — Encounter: Payer: Self-pay | Admitting: Cardiology

## 2012-11-21 ENCOUNTER — Ambulatory Visit (INDEPENDENT_AMBULATORY_CARE_PROVIDER_SITE_OTHER): Payer: Medicare Other | Admitting: *Deleted

## 2012-11-21 VITALS — BP 122/75 | HR 77 | Ht 61.0 in | Wt 144.8 lb

## 2012-11-21 DIAGNOSIS — R609 Edema, unspecified: Secondary | ICD-10-CM

## 2012-11-21 DIAGNOSIS — Z7901 Long term (current) use of anticoagulants: Secondary | ICD-10-CM

## 2012-11-21 DIAGNOSIS — R600 Localized edema: Secondary | ICD-10-CM

## 2012-11-21 DIAGNOSIS — I359 Nonrheumatic aortic valve disorder, unspecified: Secondary | ICD-10-CM

## 2012-11-21 DIAGNOSIS — I059 Rheumatic mitral valve disease, unspecified: Secondary | ICD-10-CM

## 2012-11-21 DIAGNOSIS — I4891 Unspecified atrial fibrillation: Secondary | ICD-10-CM

## 2012-11-21 MED ORDER — POTASSIUM CHLORIDE CRYS ER 20 MEQ PO TBCR: 10.0000 meq | EXTENDED_RELEASE_TABLET | Freq: Every day | ORAL | Status: DC

## 2012-11-21 MED ORDER — FUROSEMIDE 20 MG PO TABS: 20.0000 mg | ORAL_TABLET | Freq: Every day | ORAL | Status: DC

## 2012-11-21 NOTE — Assessment & Plan Note (Signed)
I asked [name] to use her Lasix with potassium supplements daily for the next 7 days, then try and be consistent with a twice weekly schedule. She seems to do better when her weight is down in the 130s. For some reason, she does not seem to stay regular with her Lasix use. We discussed this today.

## 2012-11-21 NOTE — Patient Instructions (Addendum)
Your physician recommends that you schedule a follow-up appointment in: ONE MONTH WITH Dr. McDowell/SAME DAY COUMADIN CHECK  Your physician has recommended you make the following change in your medication:   1) TAKE LASIX 20MG  ONCE DAILY FOR SEVEN DAYS  2) TAKE POTASSIUM 10MG  ONCE DAILY FOR SEVEN DAYS TO ACCOMPANY THE LASIX 3) AFTER THE SEVEN DAYS HAVE BEEN COMPLETED START TAKING LASIX 20MG  AND POTASSIUM (HALF TABLET) TWICE WEEKLY ON TUESDAYS AND FRIDAYS GOING FORWARD  Your physician recommends that you weigh, daily, at the same time every day, and in the same amount of clothing. Please record your daily weights on the handout provided and bring it to your next appointment.

## 2012-11-21 NOTE — Assessment & Plan Note (Signed)
Heart rate is well controlled today. She is due for an INR check. I reinforced need for compliance with regular checks.

## 2012-11-21 NOTE — Assessment & Plan Note (Signed)
Mild to moderate regurgitation.

## 2012-11-21 NOTE — Assessment & Plan Note (Signed)
Mild stenosis and regurgitation.

## 2012-11-21 NOTE — Progress Notes (Signed)
Clinical Summary [name] is a medically complex 79 y.o.female last seen in August. She states that she had a reaction to Cipro recently, rash on her legs, also increasing leg edema. She has not been using her Lasix regularly however. Weight is up 10 pounds from June.  Echocardiogram from May 2013 demonstrated mild basal septal hypertrophy with LVEF 55-60%, mild to moderate aortic regurgitation, restricted posterior mitral leaflet motion with mild stenosis and mild regurgitation, moderately dilated left atrium and right atrium.  She is due for a followup INR today, has not been compliant with regular checks. She denies any bleeding problems. Her last INR was therapeutic.   Allergies  Allergen Reactions  . Amitiza [Lubiprostone] Swelling  . Ciprofloxacin Nausea And Vomiting  . Sulfonamide Derivatives Nausea And Vomiting and Rash    Current Outpatient Prescriptions  Medication Sig Dispense Refill  . ALPRAZolam (XANAX) 0.5 MG tablet Take 0.5 mg by mouth at bedtime as needed. Sleep      . chlorthalidone (HYGROTON) 25 MG tablet Take 25 mg by mouth daily.        Marland Kitchen diltiazem (CARDIZEM CD) 300 MG 24 hr capsule Take 1 capsule (300 mg total) by mouth daily.  90 capsule  1  . HYDROcodone-acetaminophen (LORTAB) 10-500 MG per tablet Take 1 tablet by mouth every 6 (six) hours as needed. Pain      . hydrocortisone (CORTEF) 10 MG tablet Take 10 mg by mouth daily.      . hydrocortisone cream 1 % Apply topically 2 (two) times daily.  30 g  0  . levothyroxine (SYNTHROID, LEVOTHROID) 125 MCG tablet Take 125 mcg by mouth daily.      . mirabegron ER (MYRBETRIQ) 25 MG TB24 tablet Take 25 mg by mouth daily.      Marland Kitchen oxyCODONE-acetaminophen (PERCOCET/ROXICET) 5-325 MG per tablet       . pantoprazole (PROTONIX) 40 MG tablet Take 40 mg by mouth 2 (two) times daily.        . potassium chloride SA (K-DUR,KLOR-CON) 20 MEQ tablet Take 10 mEq by mouth daily.       . Probiotic Product (ALIGN) 4 MG CAPS Take 4 mg by  mouth daily.      . propranolol (INDERAL) 80 MG tablet Take 80 mg by mouth daily.        Marland Kitchen warfarin (COUMADIN) 2.5 MG tablet TAKE 1 TABLET BY MOUTH DAILY EXCEPT 1/2 TABLET ON MONDAY,WEDNESDAY,& FRIDAY.  30 tablet  3  . furosemide (LASIX) 20 MG tablet Take 20 mg by mouth 3 (three) times a week. Swelling      . phenazopyridine (PYRIDIUM) 100 MG tablet Take 100 mg by mouth 3 (three) times daily as needed.       No current facility-administered medications for this visit.    Past Medical History  Diagnosis Date  . Rheumatic heart disease   . IBS (irritable bowel syndrome)   . GERD (gastroesophageal reflux disease)   . Breast cancer   . Mitral regurgitation   . Mitral stenosis   . Atrial fibrillation   . PAT (paroxysmal atrial tachycardia)   . Hypothyroidism   . Melanoma of neck   . Microscopic colitis 2003    Was maintained on Asacol BID  . S/P colonoscopy April 2010    Friable anal canal, left-sided diverticula, adenomatous polyp  . S/P endoscopy April 2010    Benign polyp, s/p 54-F Maloney dilation  . Hiatal hernia   . Diverticula of colon   .  Tubulovillous adenoma   . Adenomatous polyp     Social History [name] reports that she has never smoked. She has never used smokeless tobacco. [name] reports that she does not drink alcohol.  Review of Systems Had trouble with constipation. Appetite reportedly stable. The bleeding episodes. Otherwise negative except as outlined.  Physical Examination Filed Vitals:   11/21/12 0948  BP: 122/75  Pulse: 77   Filed Weights   11/21/12 0948  Weight: 144 lb 12 oz (65.658 kg)    Elderly woman, comfortable at rest.  HEENT: Conjunctiva and lids are normal, oropharynx clear.  Neck: Supple, no elevated jugular venous pressure, no carotid bruits. No thyromegaly.  Lungs: Diminished breath sounds, no rales, nonlabored.  Cardiac: Irregularly irregular, 2/6 apical systolic murmur, no S3 gallop, no pericardial rub.  Abdomen: Soft,  nontender, bowel sounds present.  Extremities: 2+ edema with erythema, distal pulses one plus.  Neuropsychiatric: Alert oriented x3, affect appropriate.  Skin: Warm and dry. Musculoskeletal: Kyphosis noted.   Problem List and Plan   Atrial fibrillation Heart rate is well controlled today. She is due for an INR check. I reinforced need for compliance with regular checks.  Mitral valve disease Mild stenosis and regurgitation.  Aortic valve disease Mild to moderate regurgitation.  Leg edema I asked [name] to use her Lasix with potassium supplements daily for the next 7 days, then try and be consistent with a twice weekly schedule. She seems to do better when her weight is down in the 130s. For some reason, she does not seem to stay regular with her Lasix use. We discussed this today.    Jonelle Sidle, M.D., F.A.C.C.

## 2012-11-21 NOTE — Addendum Note (Signed)
Addended by: Derry Lory A on: 11/21/2012 01:33 PM   Modules accepted: Orders

## 2012-12-10 NOTE — Telephone Encounter (Signed)
error 

## 2012-12-30 ENCOUNTER — Ambulatory Visit (INDEPENDENT_AMBULATORY_CARE_PROVIDER_SITE_OTHER): Payer: Medicare Other | Admitting: Cardiology

## 2012-12-30 ENCOUNTER — Encounter: Payer: Self-pay | Admitting: Cardiology

## 2012-12-30 ENCOUNTER — Ambulatory Visit (INDEPENDENT_AMBULATORY_CARE_PROVIDER_SITE_OTHER): Payer: Medicare Other | Admitting: *Deleted

## 2012-12-30 VITALS — BP 132/78 | HR 82 | Ht 61.0 in | Wt 139.8 lb

## 2012-12-30 DIAGNOSIS — Z7901 Long term (current) use of anticoagulants: Secondary | ICD-10-CM

## 2012-12-30 DIAGNOSIS — R600 Localized edema: Secondary | ICD-10-CM

## 2012-12-30 DIAGNOSIS — I359 Nonrheumatic aortic valve disorder, unspecified: Secondary | ICD-10-CM

## 2012-12-30 DIAGNOSIS — R609 Edema, unspecified: Secondary | ICD-10-CM

## 2012-12-30 DIAGNOSIS — I059 Rheumatic mitral valve disease, unspecified: Secondary | ICD-10-CM

## 2012-12-30 DIAGNOSIS — I4891 Unspecified atrial fibrillation: Secondary | ICD-10-CM

## 2012-12-30 MED ORDER — SPIRONOLACTONE 25 MG PO TABS: 12.5000 mg | ORAL_TABLET | Freq: Every day | ORAL | Status: DC

## 2012-12-30 NOTE — Assessment & Plan Note (Signed)
Mild to moderate aortic regurgitation.

## 2012-12-30 NOTE — Patient Instructions (Addendum)
Your physician recommends that you schedule a follow-up appointment in: 1 month    STOP  Chlorthalidone and Lasix and Potassium   START Aldactone 12.5 mg daily   Your physician recommends that you return for lab work in: next two weeks   (BMET)

## 2012-12-30 NOTE — Assessment & Plan Note (Signed)
Stable on examination. Mild mitral stenosis and mild mitral regurgitation by last echocardiogram in 2013.

## 2012-12-30 NOTE — Progress Notes (Signed)
Clinical Summary [name] is an 79 y.o.female seen in early October. At that time she was complaining of leg edema, diuretic dose was increased. She feels better. Weight is down 5 pounds from the last visit. Still having difficulty with a maculopapular rash on her legs. Indicates that this is been a problem for several years.  We reviewed her medications today and discussed simplification of her diuretic regimen, trying something different to make sure she is not having a medication reaction.  Echocardiogram from May 2013 demonstrated mild basal septal hypertrophy with LVEF 55-60%, mild to moderate aortic regurgitation, restricted posterior mitral leaflet motion with mild stenosis and mild regurgitation, moderately dilated left atrium and right atrium.   Allergies  Allergen Reactions  . Amitiza [Lubiprostone] Swelling  . Ciprofloxacin Nausea And Vomiting  . Clindamycin/Lincomycin     Nauseated/headache  . Doxycycline     Nauseated/headache  . Sulfonamide Derivatives Nausea And Vomiting and Rash    Current Outpatient Prescriptions  Medication Sig Dispense Refill  . ALPRAZolam (XANAX) 0.5 MG tablet Take 0.5 mg by mouth at bedtime as needed. Sleep      . chlorthalidone (HYGROTON) 25 MG tablet Take 25 mg by mouth daily.        Marland Kitchen diltiazem (CARDIZEM CD) 300 MG 24 hr capsule Take 1 capsule (300 mg total) by mouth daily.  90 capsule  1  . furosemide (LASIX) 20 MG tablet Take 20 mg by mouth as needed. Swelling      . HYDROcodone-acetaminophen (LORTAB) 10-500 MG per tablet Take 1 tablet by mouth every 6 (six) hours as needed. Pain      . hydrocortisone (CORTEF) 10 MG tablet Take 10 mg by mouth daily.      . hydrocortisone cream 1 % Apply topically 2 (two) times daily.  30 g  0  . levothyroxine (SYNTHROID, LEVOTHROID) 125 MCG tablet Take 125 mcg by mouth daily.      . mirabegron ER (MYRBETRIQ) 25 MG TB24 tablet Take 25 mg by mouth daily.      . pantoprazole (PROTONIX) 40 MG tablet Take 40  mg by mouth 2 (two) times daily.        . potassium chloride SA (K-DUR,KLOR-CON) 20 MEQ tablet Take 10 mEq by mouth as needed.      . prednisoLONE acetate (PRED FORTE) 1 % ophthalmic suspension       . Probiotic Product (ALIGN) 4 MG CAPS Take 4 mg by mouth daily.      . propranolol (INDERAL) 80 MG tablet Take 80 mg by mouth daily.        Marland Kitchen warfarin (COUMADIN) 2.5 MG tablet       . oxyCODONE-acetaminophen (PERCOCET/ROXICET) 5-325 MG per tablet Take 1 tablet by mouth every 6 (six) hours as needed.       . phenazopyridine (PYRIDIUM) 100 MG tablet Take 100 mg by mouth 3 (three) times daily as needed.       No current facility-administered medications for this visit.    Past Medical History  Diagnosis Date  . Rheumatic heart disease   . IBS (irritable bowel syndrome)   . GERD (gastroesophageal reflux disease)   . Breast cancer   . Mitral regurgitation   . Mitral stenosis   . Atrial fibrillation   . PAT (paroxysmal atrial tachycardia)   . Hypothyroidism   . Melanoma of neck   . Microscopic colitis 2003    Was maintained on Asacol BID  . S/P colonoscopy April 2010  Friable anal canal, left-sided diverticula, adenomatous polyp  . S/P endoscopy April 2010    Benign polyp, s/p 54-F Maloney dilation  . Hiatal hernia   . Diverticula of colon   . Tubulovillous adenoma   . Adenomatous polyp     Social History [name] reports that she has never smoked. She has never used smokeless tobacco. [name] reports that she does not drink alcohol.  Review of Systems No angina, no palpitations or syncope. Otherwise negative.  Physical Examination Filed Vitals:   12/30/12 1032  BP: 132/78  Pulse: 82   Filed Weights   12/30/12 1032  Weight: 139 lb 12 oz (63.39 kg)    Elderly woman, comfortable at rest.  HEENT: Conjunctiva and lids are normal, oropharynx clear.  Neck: Supple, no elevated jugular venous pressure, no carotid bruits. No thyromegaly.  Lungs: Diminished breath sounds,  no rales, nonlabored.  Cardiac: Irregularly irregular, 2/6 apical systolic murmur, no S3 gallop, no pericardial rub.  Abdomen: Soft, nontender, bowel sounds present.  Extremities: No significant edema with erythema, distal pulses one plus.  Neuropsychiatric: Alert oriented x3, affect appropriate.  Skin: Warm and dry. Macular papular rash on lower legs. Musculoskeletal: Kyphosis noted.   Problem List and Plan   Leg edema This has improved although she still has trouble with rash. We reviewed her medications. Plan at this time will be to stop Lasix, potassium, and chlorthalidone. This will be replaced with a trial of Aldactone 12.5 mg daily. Followup BMET in 2 weeks with clinical visit in a month.  Mitral valve disease Stable on examination. Mild mitral stenosis and mild mitral regurgitation by last echocardiogram in 2013.  Aortic valve disease Mild to moderate aortic regurgitation.    Jonelle Sidle, M.D., F.A.C.C.

## 2012-12-30 NOTE — Assessment & Plan Note (Signed)
This has improved although she still has trouble with rash. We reviewed her medications. Plan at this time will be to stop Lasix, potassium, and chlorthalidone. This will be replaced with a trial of Aldactone 12.5 mg daily. Followup BMET in 2 weeks with clinical visit in a month.

## 2012-12-31 LAB — BASIC METABOLIC PANEL
CO2: 33 mEq/L — ABNORMAL HIGH (ref 19–32)
Calcium: 9.2 mg/dL (ref 8.4–10.5)
Chloride: 89 mEq/L — ABNORMAL LOW (ref 96–112)
Potassium: 3.5 mEq/L (ref 3.5–5.3)
Sodium: 133 mEq/L — ABNORMAL LOW (ref 135–145)

## 2013-01-06 ENCOUNTER — Telehealth: Payer: Self-pay | Admitting: *Deleted

## 2013-01-06 DIAGNOSIS — I10 Essential (primary) hypertension: Secondary | ICD-10-CM

## 2013-01-06 NOTE — Telephone Encounter (Signed)
Noted pt results were to repeat the BMET in 2 weeks on the 12-31-12 results, placed orders in the pt chart to have the labs repeated, pt will walk in to the [facility] labs to have drawn, pt noted to have f/u apt 02-06-13 with Dr. Diona Browner pt will keep apt and aware we will call her with the lab results once noted, pt understood all instructions

## 2013-01-06 NOTE — Telephone Encounter (Signed)
PT NEEDS NEW LAB ORDER PUT IN TO HAVE DONE FOR LAST ABNORMAL LABS/TMJ

## 2013-01-14 LAB — BASIC METABOLIC PANEL
BUN: 29 mg/dL — ABNORMAL HIGH (ref 6–23)
CO2: 30 mEq/L (ref 19–32)
Chloride: 93 mEq/L — ABNORMAL LOW (ref 96–112)
Creat: 0.82 mg/dL (ref 0.50–1.10)
Glucose, Bld: 103 mg/dL — ABNORMAL HIGH (ref 70–99)
Potassium: 3.9 mEq/L (ref 3.5–5.3)

## 2013-01-28 ENCOUNTER — Encounter: Payer: Self-pay | Admitting: Internal Medicine

## 2013-02-06 ENCOUNTER — Encounter: Payer: Medicare Other | Admitting: *Deleted

## 2013-02-06 ENCOUNTER — Encounter: Payer: Self-pay | Admitting: Cardiology

## 2013-02-06 ENCOUNTER — Ambulatory Visit (INDEPENDENT_AMBULATORY_CARE_PROVIDER_SITE_OTHER): Payer: Medicare Other | Admitting: Cardiology

## 2013-02-06 ENCOUNTER — Ambulatory Visit (INDEPENDENT_AMBULATORY_CARE_PROVIDER_SITE_OTHER): Payer: Medicare Other | Admitting: *Deleted

## 2013-02-06 VITALS — BP 138/74 | HR 84 | Ht 61.0 in

## 2013-02-06 DIAGNOSIS — I4891 Unspecified atrial fibrillation: Secondary | ICD-10-CM

## 2013-02-06 DIAGNOSIS — Z7901 Long term (current) use of anticoagulants: Secondary | ICD-10-CM

## 2013-02-06 DIAGNOSIS — R600 Localized edema: Secondary | ICD-10-CM

## 2013-02-06 DIAGNOSIS — R609 Edema, unspecified: Secondary | ICD-10-CM

## 2013-02-06 DIAGNOSIS — I359 Nonrheumatic aortic valve disorder, unspecified: Secondary | ICD-10-CM

## 2013-02-06 DIAGNOSIS — I059 Rheumatic mitral valve disease, unspecified: Secondary | ICD-10-CM

## 2013-02-06 LAB — POCT INR: INR: 2.4

## 2013-02-06 MED ORDER — DILTIAZEM HCL ER COATED BEADS 300 MG PO CP24: 300.0000 mg | ORAL_CAPSULE | Freq: Every day | ORAL | Status: DC

## 2013-02-06 MED ORDER — WARFARIN SODIUM 2.5 MG PO TABS: 2.5000 mg | ORAL_TABLET | Freq: Every morning | ORAL | Status: DC

## 2013-02-06 NOTE — Assessment & Plan Note (Signed)
Continue Cardizem CD and Coumadin. Heart rate is adequately controlled on examination today.

## 2013-02-06 NOTE — Assessment & Plan Note (Signed)
Mild mitral stenosis with mild mitral regurgitation.

## 2013-02-06 NOTE — Progress Notes (Signed)
This encounter was created in error - please disregard.

## 2013-02-06 NOTE — Assessment & Plan Note (Signed)
Mild to moderate aortic regurgitation. 

## 2013-02-06 NOTE — Progress Notes (Signed)
Clinical Summary Ms. Brocker is an 79 y.o.female last seen in November. At that time leg edema had improved although she was having a rash on her legs. She was taken off Lasix, potassium, and chlorthalidone with a switch to Aldactone. She states that her leg swelling has completely resolved. Rash seems somewhat better as well. Main complaint is of abdominal bloating and constipation. She has a visit with GI next week. She tells me that she has a history of IBS.  Followup lab work showed potassium 3.9, BUN 29, creatinine 0.8.  Echocardiogram from May 2013 demonstrated mild basal septal hypertrophy with LVEF 55-60%, mild to moderate aortic regurgitation, restricted posterior mitral leaflet motion with mild stenosis and mild regurgitation, moderately dilated left atrium and right atrium.   Allergies  Allergen Reactions  . Amitiza [Lubiprostone] Swelling  . Ciprofloxacin Nausea And Vomiting  . Clindamycin/Lincomycin     Nauseated/headache  . Doxycycline     Nauseated/headache  . Sulfonamide Derivatives Nausea And Vomiting and Rash    Current Outpatient Prescriptions  Medication Sig Dispense Refill  . ALPRAZolam (XANAX) 0.5 MG tablet Take 0.5 mg by mouth at bedtime as needed. Sleep      . chlorthalidone (HYGROTON) 25 MG tablet       . diltiazem (CARDIZEM CD) 300 MG 24 hr capsule Take 1 capsule (300 mg total) by mouth daily.  90 capsule  3  . HYDROcodone-acetaminophen (LORTAB) 10-500 MG per tablet Take 1 tablet by mouth every 6 (six) hours as needed. Pain      . hydrocortisone (CORTEF) 10 MG tablet Take 10 mg by mouth daily.      . hydrocortisone cream 1 % Apply topically 2 (two) times daily.  30 g  0  . levothyroxine (SYNTHROID, LEVOTHROID) 125 MCG tablet Take 125 mcg by mouth daily.      . mirabegron ER (MYRBETRIQ) 25 MG TB24 tablet Take 25 mg by mouth daily.      Marland Kitchen oxyCODONE-acetaminophen (PERCOCET/ROXICET) 5-325 MG per tablet Take 1 tablet by mouth every 6 (six) hours as needed.         . pantoprazole (PROTONIX) 40 MG tablet Take 40 mg by mouth 2 (two) times daily.        . phenazopyridine (PYRIDIUM) 100 MG tablet Take 100 mg by mouth 3 (three) times daily as needed.      . prednisoLONE acetate (PRED FORTE) 1 % ophthalmic suspension       . Probiotic Product (ALIGN) 4 MG CAPS Take 4 mg by mouth daily.      . propranolol (INDERAL) 80 MG tablet Take 80 mg by mouth daily.        Marland Kitchen spironolactone (ALDACTONE) 25 MG tablet Take 0.5 tablets (12.5 mg total) by mouth daily.  30 tablet  6  . warfarin (COUMADIN) 2.5 MG tablet Take 1 tablet (2.5 mg total) by mouth every morning.  90 tablet  3   No current facility-administered medications for this visit.    Past Medical History  Diagnosis Date  . Rheumatic heart disease   . IBS (irritable bowel syndrome)   . GERD (gastroesophageal reflux disease)   . Breast cancer   . Mitral regurgitation   . Mitral stenosis   . Atrial fibrillation   . PAT (paroxysmal atrial tachycardia)   . Hypothyroidism   . Melanoma of neck   . Microscopic colitis 2003    Was maintained on Asacol BID  . S/P colonoscopy April 2010    Friable anal  canal, left-sided diverticula, adenomatous polyp  . S/P endoscopy April 2010    Benign polyp, s/p 54-F Maloney dilation  . Hiatal hernia   . Diverticula of colon   . Tubulovillous adenoma   . Adenomatous polyp     Social History Ms. Freimark reports that she has never smoked. She has never used smokeless tobacco. Ms. Sonoda reports that she does not drink alcohol.  Review of Systems No palpitations. Intermittent atypical chest discomfort, brief. Otherwise negative  Physical Examination Filed Vitals:   02/06/13 1024  BP: 138/74  Pulse: 84    Elderly woman, comfortable at rest.  HEENT: Conjunctiva and lids are normal, oropharynx clear.  Neck: Supple, no elevated jugular venous pressure, no carotid bruits. No thyromegaly.  Lungs: Diminished breath sounds, no rales, nonlabored.  Cardiac: Irregularly  irregular, 2/6 apical systolic murmur, no S3 gallop, no pericardial rub.  Abdomen: Soft, protuberant, bowel sounds present.  Extremities: No significant edema with mild erythema, distal pulses one plus.  Neuropsychiatric: Alert oriented x3, affect appropriate.  Skin: Warm and dry.  Musculoskeletal: Kyphosis noted.   Problem List and Plan   Leg edema This has resolved, rash also seems somewhat better. Plan to continue low-dose Aldactone for now. Followup BMET for next visit.  Atrial fibrillation Continue Cardizem CD and Coumadin. Heart rate is adequately controlled on examination today.  Aortic valve disease Mild to moderate aortic regurgitation.  Mitral valve disease Mild mitral stenosis with mild mitral regurgitation.    Jonelle Sidle, M.D., F.A.C.C.

## 2013-02-06 NOTE — Patient Instructions (Signed)
Your physician recommends that you schedule a follow-up appointment in: 3 months with Dr Randa Spike will receive a reminder letter two months in advance reminding you to call and schedule your appointment. If you don't receive this letter, please contact our office.  Your physician recommends that you continue on your current medications as directed. Please refer to the Current Medication list given to you today.  Your physician recommends that you return for lab work right before next visit. BMET

## 2013-02-06 NOTE — Assessment & Plan Note (Signed)
This has resolved, rash also seems somewhat better. Plan to continue low-dose Aldactone for now. Followup BMET for next visit.

## 2013-02-12 ENCOUNTER — Encounter: Payer: Self-pay | Admitting: Gastroenterology

## 2013-02-12 ENCOUNTER — Ambulatory Visit (INDEPENDENT_AMBULATORY_CARE_PROVIDER_SITE_OTHER): Payer: Medicare Other | Admitting: Gastroenterology

## 2013-02-12 VITALS — BP 130/75 | HR 72 | Temp 98.3°F | Ht 62.0 in | Wt 140.0 lb

## 2013-02-12 DIAGNOSIS — R1310 Dysphagia, unspecified: Secondary | ICD-10-CM

## 2013-02-12 DIAGNOSIS — K5900 Constipation, unspecified: Secondary | ICD-10-CM

## 2013-02-12 DIAGNOSIS — K3189 Other diseases of stomach and duodenum: Secondary | ICD-10-CM

## 2013-02-12 DIAGNOSIS — R1013 Epigastric pain: Secondary | ICD-10-CM | POA: Insufficient documentation

## 2013-02-12 MED ORDER — LINACLOTIDE 290 MCG PO CAPS: 290.0000 ug | ORAL_CAPSULE | Freq: Every day | ORAL | Status: DC

## 2013-02-12 NOTE — Patient Instructions (Signed)
Start taking Linzess 290 mcg (1 capsule) each morning, 30 minutes before breakfast. I have provided samples as well. Please call me if this is too strong.  Continue to take the probiotic daily.  You have been scheduled for an upper endoscopy with dilation by Dr. Jena Gauss. YOU WILL NEED TO HOLD COUMADIN 4 DAY BEFORE THIS.   Have a wonderful Christmas and New Year!  We will see you again in 3 months.

## 2013-02-12 NOTE — Assessment & Plan Note (Addendum)
79 year old female with chronic lower abdominal pain, now with early satiety, poor appetite, underlying nausea. Vague pill and solid food dysphagia worsening over past few months. Last EGD in 2010 with 74 F dilation. Remains on Protonix twice a day. Recommend repeat EGD with dilation now for further assessment. Underlying constipation and bloating likely exacerbating sensation of "fullness" and decreased appetite. Unable to exclude gastritis, PUD, less likely malignancy. May have age-related motility disorder.   Proceed with upper endoscopy +/- dilation in the near future with Dr. Jena Gauss. The risks, benefits, and alternatives have been discussed in detail with patient. They have stated understanding and desire to proceed.  Continue PPI HOLD COUMADIN X 4 DAYS; we will verify this with Dr. Diona Browner prior to procedure

## 2013-02-12 NOTE — Assessment & Plan Note (Addendum)
Restart Linzess but at higher dose: 290 mcg. States some good results with 145 mcg but not ideal. Lower abdominal pain likely from IBS, chronic constipation. Her symptoms are not typical for mesenteric ischemia; lower abdominal pain appears chronic and constant. Question adhesive disease as a factor. CT reviewed from 2013 with radiologist. Although not ideal for assessing vasculature, appears little calcifications. If abdominal pain were to persist, worsen, or further weight loss, would have low threshold for CTA. Return in 3 months.

## 2013-02-12 NOTE — Assessment & Plan Note (Signed)
Dilation as appropriate. Consider BPE.

## 2013-02-12 NOTE — Progress Notes (Signed)
    Referring Provider: Fusco, Lawrence, MD Primary Care Physician:  FUSCO,LAWRENCE J., MD Primary GI: Dr. Rourk   Chief Complaint  Patient presents with  . Abdominal Pain    HPI:   [name] presents today at the request of Dr. Fusco secondary to abdominal pain. Colonoscopy up-to-date from Aug 2013 with tubular adenoma. History of abdominal pain and constipation. Previously managed on Linzess 145 mcg daily. States she hasn't had anything but bland foods. Notes lower abdominal discomfort is "all the time". No Linzess currently. None in 6 weeks. Several days without a bowel movement. Takes 3 colace about every other night. Stays nauseated all the time. States she feels up "so fast". Poor appetite. Pain not exacerbated by eating. No vomiting. Unable to swallow large pills. Vague solid food dysphagia. Last few months getting worse.   Multiple non-GI complaints.    Currently weighs 140. Weight in 150 range in Jan 2014.   Past Medical History  Diagnosis Date  . Rheumatic heart disease   . IBS (irritable bowel syndrome)   . GERD (gastroesophageal reflux disease)   . Breast cancer   . Mitral regurgitation   . Mitral stenosis   . Atrial fibrillation   . PAT (paroxysmal atrial tachycardia)   . Hypothyroidism   . Melanoma of neck   . Microscopic colitis 2003    Was maintained on Asacol BID  . S/P colonoscopy April 2010    Friable anal canal, left-sided diverticula, adenomatous polyp  . S/P endoscopy April 2010    Benign polyp, s/p 54-F Maloney dilation  . Hiatal hernia   . Diverticula of colon   . Tubulovillous adenoma   . Adenomatous polyp     Past Surgical History  Procedure Laterality Date  . Cardiac catheterization      NORMAL CORONARY ARTERIES AT CATH IN 2006  . Abdominal hysterectomy    . Mastectomy      RIGHT BREAST, followed by left breast after radiation  . Melanoma removal      SURGERY NECK  . Cataracts    . Hemorrhoid surgery    . Tonsillectomy    .  Colonoscopy  06/12/2008    Dr. Rourk-Friable anal canal, left-sided diverticula, adenomatous polyp  . Esophagogastroduodenoscopy  06/12/2008    Dr. Rourk-Benign polyp, s/p 54-F Maloney dilation  . Colonoscopy  09/28/2011    Dr. Rourk- tubular adenoma, colonic diverticulosis    Current Outpatient Prescriptions  Medication Sig Dispense Refill  . ALPRAZolam (XANAX) 0.5 MG tablet Take 0.5 mg by mouth at bedtime as needed. Sleep      . chlorthalidone (HYGROTON) 25 MG tablet       . diltiazem (CARDIZEM CD) 300 MG 24 hr capsule Take 1 capsule (300 mg total) by mouth daily.  90 capsule  3  . HYDROcodone-acetaminophen (LORTAB) 10-500 MG per tablet Take 2 tablets by mouth every 6 (six) hours as needed. Pain      . hydrocortisone (CORTEF) 10 MG tablet Take 10 mg by mouth daily.      . hydrocortisone cream 1 % Apply topically 2 (two) times daily.  30 g  0  . levothyroxine (SYNTHROID, LEVOTHROID) 125 MCG tablet Take 125 mcg by mouth daily.      . mirabegron ER (MYRBETRIQ) 25 MG TB24 tablet Take 25 mg by mouth daily.      . pantoprazole (PROTONIX) 40 MG tablet Take 40 mg by mouth 2 (two) times daily.        .   phenazopyridine (PYRIDIUM) 100 MG tablet Take 100 mg by mouth 3 (three) times daily as needed.      . prednisoLONE acetate (PRED FORTE) 1 % ophthalmic suspension       . Probiotic Product (ALIGN) 4 MG CAPS Take 4 mg by mouth daily.      . propranolol (INDERAL) 80 MG tablet Take 80 mg by mouth daily.        . spironolactone (ALDACTONE) 25 MG tablet Take 0.5 tablets (12.5 mg total) by mouth daily.  30 tablet  6  . warfarin (COUMADIN) 2.5 MG tablet Take 1 tablet (2.5 mg total) by mouth every morning.  90 tablet  3  . oxyCODONE-acetaminophen (PERCOCET/ROXICET) 5-325 MG per tablet Take 1 tablet by mouth every 6 (six) hours as needed.        No current facility-administered medications for this visit.    Allergies as of 02/12/2013 - Review Complete 02/12/2013  Allergen Reaction Noted  . Amitiza  [lubiprostone] Swelling 01/10/2012  . Ciprofloxacin Nausea And Vomiting 06/07/2011  . Clindamycin/lincomycin  12/30/2012  . Doxycycline  12/30/2012  . Sulfonamide derivatives Nausea And Vomiting and Rash 10/23/2007    Family History  Problem Relation Age of Onset  . Stroke Mother   . Heart attack Father   . Breast cancer Sister   . Colon cancer Neg Hx     History   Social History  . Marital Status: Single    Spouse Name: N/A    Number of Children: N/A  . Years of Education: N/A   Occupational History  . RETIRED    Social History Main Topics  . Smoking status: Never Smoker   . Smokeless tobacco: Never Used  . Alcohol Use: No  . Drug Use: No  . Sexual Activity: No   Other Topics Concern  . None   Social History Narrative  . None    Review of Systems: Gen: see HPI CV: intermittent chest discomfort Resp: Denies dyspnea at rest, cough, wheezing, coughing up blood, and pleurisy. GI: see HPI Derm: Denies rash, itching, dry skin Psych: +depression/anxiety Heme: Denies bruising, bleeding, and enlarged lymph nodes.  Physical Exam: BP 130/75  Pulse 72  Temp(Src) 98.3 F (36.8 C) (Oral)  Ht 5' 2" (1.575 m)  Wt 140 lb (63.504 kg)  BMI 25.60 kg/m2 General:   Alert and oriented. Mild anxiety but pleasant Head:  Normocephalic and atraumatic. Eyes:  Conjuctiva clear without scleral icterus. Mouth:  Oral mucosa pink and moist. Good dentition. No lesions. Neck:  Supple, without mass or thyromegaly. Heart:  S1, S2 present, irregularly irregular, systolic murmur Abdomen:  +BS, soft, mild TTP lower abdomen, protuberant but without rebound or guarding.  Extremities:  Without edema. Neurologic:  Alert and  oriented x4;  grossly normal neurologically. Skin:  Intact without significant lesions or rashes. Psych:  Alert and cooperative. Mild anxiety  CT Oct 2013: Without acute findings.  I spoke with radiologist at [facility] at time of visit who was able to see origins of SMA,  IMA, and celiac. Very little calfications noted. If further concern, needs CTA.   

## 2013-02-17 NOTE — Progress Notes (Signed)
cc'd to pcp 

## 2013-02-18 ENCOUNTER — Other Ambulatory Visit: Payer: Self-pay | Admitting: Internal Medicine

## 2013-02-18 ENCOUNTER — Telehealth: Payer: Self-pay

## 2013-02-18 DIAGNOSIS — R109 Unspecified abdominal pain: Secondary | ICD-10-CM

## 2013-02-18 DIAGNOSIS — R1319 Other dysphagia: Secondary | ICD-10-CM

## 2013-02-18 NOTE — Telephone Encounter (Signed)
Routing to Anna Sams, NP and Leigh Ann.  

## 2013-02-18 NOTE — Telephone Encounter (Signed)
Ms. Bermea should be able to hold Coumadin as requested for EGD if this is requested by Dr. Jena Gauss. Will forward to [facility] in Coumadin clinic so that appropriate followup can be arranged.

## 2013-02-18 NOTE — Telephone Encounter (Signed)
Routing to Leigh Ann to schedule.  

## 2013-02-18 NOTE — Telephone Encounter (Signed)
Pt told to take last dose of coumadin 02/23/13 and restart night of procedure if OK with Dr Jena Gauss.  Pt verbalized understanding.

## 2013-02-18 NOTE — Telephone Encounter (Signed)
Pt called office for an update, pt updated the final answer from LR is pending and we will contact her with the final instructions once completed, pt understood

## 2013-02-18 NOTE — Telephone Encounter (Signed)
[name] is scheduled with RMR on Friday January 9th at 11:00 and she is aware

## 2013-02-18 NOTE — Telephone Encounter (Signed)
[name] and DR. Diona Browner,   This patient was seen in our office on 02/12/2013 by Gerrit Halls, NP. She has been advised to have an EGD with ED by Dr. Jena Gauss in the very near future for abdominal pain and dysphagia.   We would like to know if it is advisable for her to hold her coumadin for 4 days prior to having the procedure.  Please let us know.  Thanks very much!

## 2013-02-18 NOTE — Telephone Encounter (Signed)
Please let me know when pt is scheduled.  Thanks

## 2013-02-19 ENCOUNTER — Encounter (HOSPITAL_COMMUNITY): Payer: Self-pay | Admitting: Pharmacy Technician

## 2013-02-28 ENCOUNTER — Encounter (HOSPITAL_COMMUNITY): Payer: Self-pay | Admitting: *Deleted

## 2013-02-28 ENCOUNTER — Ambulatory Visit (HOSPITAL_COMMUNITY)
Admission: RE | Admit: 2013-02-28 | Discharge: 2013-02-28 | Disposition: A | Discharge status: Home / Self Care | Payer: Medicare Other | Source: Ambulatory Visit | Attending: Internal Medicine | Admitting: Internal Medicine

## 2013-02-28 ENCOUNTER — Encounter (HOSPITAL_COMMUNITY)
Admission: RE | Disposition: A | Discharge status: Home / Self Care | Payer: Self-pay | Source: Ambulatory Visit | Attending: Internal Medicine

## 2013-02-28 DIAGNOSIS — K2100 Gastro-esophageal reflux disease with esophagitis, without bleeding: Secondary | ICD-10-CM | POA: Insufficient documentation

## 2013-02-28 DIAGNOSIS — D131 Benign neoplasm of stomach: Secondary | ICD-10-CM

## 2013-02-28 DIAGNOSIS — K449 Diaphragmatic hernia without obstruction or gangrene: Secondary | ICD-10-CM | POA: Insufficient documentation

## 2013-02-28 DIAGNOSIS — K2940 Chronic atrophic gastritis without bleeding: Secondary | ICD-10-CM | POA: Insufficient documentation

## 2013-02-28 DIAGNOSIS — R1310 Dysphagia, unspecified: Secondary | ICD-10-CM

## 2013-02-28 DIAGNOSIS — R1319 Other dysphagia: Secondary | ICD-10-CM

## 2013-02-28 DIAGNOSIS — K2960 Other gastritis without bleeding: Secondary | ICD-10-CM

## 2013-02-28 DIAGNOSIS — R109 Unspecified abdominal pain: Secondary | ICD-10-CM

## 2013-02-28 DIAGNOSIS — Z7901 Long term (current) use of anticoagulants: Secondary | ICD-10-CM | POA: Insufficient documentation

## 2013-02-28 HISTORY — PX: ESOPHAGOGASTRODUODENOSCOPY (EGD) WITH ESOPHAGEAL DILATION: SHX5812

## 2013-02-28 HISTORY — DX: Sleep apnea, unspecified: G47.30

## 2013-02-28 SURGERY — ESOPHAGOGASTRODUODENOSCOPY (EGD) WITH ESOPHAGEAL DILATION
Anesthesia: Moderate Sedation

## 2013-02-28 MED ORDER — BUTAMBEN-TETRACAINE-BENZOCAINE 2-2-14 % EX AERO
INHALATION_SPRAY | CUTANEOUS | Status: DC | PRN
  Administered 2013-02-28: 2 via TOPICAL

## 2013-02-28 MED ORDER — MEPERIDINE HCL 100 MG/ML IJ SOLN
INTRAMUSCULAR | Status: DC | PRN
  Administered 2013-02-28: 50 mg via INTRAVENOUS

## 2013-02-28 MED ORDER — MIDAZOLAM HCL 5 MG/5ML IJ SOLN
INTRAMUSCULAR | Status: DC | PRN
  Administered 2013-02-28: 1 mg via INTRAVENOUS
  Administered 2013-02-28: 2 mg via INTRAVENOUS

## 2013-02-28 MED ORDER — STERILE WATER FOR IRRIGATION IR SOLN
Status: DC | PRN
  Administered 2013-02-28: 11:00:00

## 2013-02-28 MED ORDER — MIDAZOLAM HCL 5 MG/5ML IJ SOLN
INTRAMUSCULAR | Status: AC
  Filled 2013-02-28: qty 10

## 2013-02-28 MED ORDER — MEPERIDINE HCL 100 MG/ML IJ SOLN
INTRAMUSCULAR | Status: AC
  Filled 2013-02-28: qty 2

## 2013-02-28 MED ORDER — ONDANSETRON HCL 4 MG/2ML IJ SOLN
INTRAMUSCULAR | Status: AC
  Filled 2013-02-28: qty 2

## 2013-02-28 MED ORDER — SODIUM CHLORIDE 0.9 % IV SOLN
INTRAVENOUS | Status: DC
  Administered 2013-02-28: 11:00:00 via INTRAVENOUS

## 2013-02-28 NOTE — Discharge Instructions (Addendum)
EGD Discharge instructions Please read the instructions outlined below and refer to this sheet in the next few weeks. These discharge instructions provide you with general information on caring for yourself after you leave the hospital. Your doctor may also give you specific instructions. While your treatment has been planned according to the most current medical practices available, unavoidable complications occasionally occur. If you have any problems or questions after discharge, please call your doctor. ACTIVITY  You may resume your regular activity but move at a slower pace for the next 24 hours.   Take frequent rest periods for the next 24 hours.   Walking will help expel (get rid of) the air and reduce the bloated feeling in your abdomen.   No driving for 24 hours (because of the anesthesia (medicine) used during the test).   You may shower.   Do not sign any important legal documents or operate any machinery for 24 hours (because of the anesthesia used during the test).  NUTRITION  Drink plenty of fluids.   You may resume your normal diet.   Begin with a light meal and progress to your normal diet.   Avoid alcoholic beverages for 24 hours or as instructed by your caregiver.  MEDICATIONS  You may resume your normal medications unless your caregiver tells you otherwise.  WHAT YOU CAN EXPECT TODAY  You may experience abdominal discomfort such as a feeling of fullness or gas pains.  FOLLOW-UP  Your doctor will discuss the results of your test with you.  SEEK IMMEDIATE MEDICAL ATTENTION IF ANY OF THE FOLLOWING OCCUR:  Excessive nausea (feeling sick to your stomach) and/or vomiting.   Severe abdominal pain and distention (swelling).   Trouble swallowing.   Temperature over 101 F (37.8 C).   Rectal bleeding or vomiting of blood.   Continue Protonix 40 mg twice daily  Do not take anymore Linzess  Since she had her esophagus dilated today, try resuming MiraLax 17  g orally daily to twice daily for constipation  Resume Coumadin today  Office visit with [place] in 3 months  Further recommendations to follow pending review of pathology report

## 2013-02-28 NOTE — Op Note (Signed)
[facility] [address] [place], [address]   ENDOSCOPY PROCEDURE REPORT  PATIENT: Special, Ranes  MR#: [id number] BIRTHDATE: 1929-05-23 , 83  yrs. old GENDER: Female ENDOSCOPIST: R.  Garfield Cornea, MD FACP FACG REFERRED BY:  Kerin Perna, M.D.  [name], M.D. PROCEDURE DATE:  02/28/2013 PROCEDURE:    EGD with Venia Minks dilation followed by gastric biopsy  INDICATIONS:     esophageal dysphagia.  INFORMED CONSENT:   The risks, benefits, limitations, alternatives and imponderables have been discussed.  The potential for biopsy, esophogeal dilation, etc. have also been reviewed.  Questions have been answered.  All parties agreeable.  Please see the history and physical in the medical record for more information.  MEDICATIONS:    Versed 3 mg IV and Demerol 50 mg IV in divided doses. Zofran 4 mg IV. Cetacaine spray.  DESCRIPTION OF PROCEDURE:   The EG-[id number] ([id number])  endoscope was introduced through the mouth and advanced to the second portion of the duodenum without difficulty or limitations.  The mucosal surfaces were surveyed very carefully during advancement of the scope and upon withdrawal.  Retroflexion view of the proximal stomach and esophagogastric junction was performed.      FINDINGS:   tiny distal esophageal erosions straddling the EG junction. No Barrett's esophagus. Otherwise, tubular esophagus patent throughout its course. Stomach empty. Small hiatal hernia. Diffuse gastric erythema with antral erosions. Multiple 1-3 mm benign-appearing gastric polyps. No ulcer or infiltrating process. Patent pylorus. Normal first and second portion of the duodenum  THERAPEUTIC / DIAGNOSTIC MANEUVERS PERFORMED:  A 54 French Maloney dilator was passed to full insertion easily. A look back revealed no apparent complication related to this maneuver. Subsequently, biopsies of the abnormal gastric mucosa and gastric polyps  taken separately.   COMPLICATIONS:  None  IMPRESSION:  Mild changes of reflux esophagitis. Status post passage of a Maloney dilator. Hiatal hernia. Gastric erosions and gastric polyps  -  biopsied  RECOMMENDATIONS:  Continue protonix twice daily. Stop Linzess; Try resuming MiraLax 17 g orally daily to twice daily. Resume Coumadin today. Followup on pathology. Office visit in 3 months.  Addendum: Friend/caregiver thinks patient may not be on a PPI. She will check her medications at home and let us know.    _______________________________ R. Garfield Cornea, MD FACP [facility] eSigned:  R. Garfield Cornea, MD FACP [facility] 02/28/2013 11:25 AM     CC:  PATIENT NAME:  [name], [name] MR#: [id number]

## 2013-02-28 NOTE — H&P (View-Only) (Signed)
Referring Provider: Redmond School, MD Primary Care Physician:  [name], MD Primary GI: Dr. Gala Romney   Chief Complaint  Patient presents with  . Abdominal Pain    HPI:   [name] presents today at the request of Dr. Gerarda Fraction secondary to abdominal pain. Colonoscopy up-to-date from Aug 2013 with tubular adenoma. History of abdominal pain and constipation. Previously managed on Linzess 145 mcg daily. States she hasn't had anything but bland foods. Notes lower abdominal discomfort is "all the time". No Linzess currently. None in 6 weeks. Several days without a bowel movement. Takes 3 colace about every other night. Stays nauseated all the time. States she feels up "so fast". Poor appetite. Pain not exacerbated by eating. No vomiting. Unable to swallow large pills. Vague solid food dysphagia. Last few months getting worse.   Multiple non-GI complaints.    Currently weighs 140. Weight in 150 range in Jan 2014.   Past Medical History  Diagnosis Date  . Rheumatic heart disease   . IBS (irritable bowel syndrome)   . GERD (gastroesophageal reflux disease)   . Breast cancer   . Mitral regurgitation   . Mitral stenosis   . Atrial fibrillation   . PAT (paroxysmal atrial tachycardia)   . Hypothyroidism   . Melanoma of neck   . Microscopic colitis 2003    Was maintained on Asacol BID  . S/P colonoscopy April 2010    Friable anal canal, left-sided diverticula, adenomatous polyp  . S/P endoscopy April 2010    Benign polyp, s/p 54-F Maloney dilation  . Hiatal hernia   . Diverticula of colon   . Tubulovillous adenoma   . Adenomatous polyp     Past Surgical History  Procedure Laterality Date  . Cardiac catheterization      NORMAL CORONARY ARTERIES AT CATH IN 2006  . Abdominal hysterectomy    . Mastectomy      RIGHT BREAST, followed by left breast after radiation  . Melanoma removal      SURGERY NECK  . Cataracts    . Hemorrhoid surgery    . Tonsillectomy    .  Colonoscopy  06/12/2008    Dr. Laurine Blazer anal canal, left-sided diverticula, adenomatous polyp  . Esophagogastroduodenoscopy  06/12/2008    Dr. Marice Potter polyp, s/p 54-F Venia Minks dilation  . Colonoscopy  09/28/2011    Dr. Gala Romney- tubular adenoma, colonic diverticulosis    Current Outpatient Prescriptions  Medication Sig Dispense Refill  . ALPRAZolam (XANAX) 0.5 MG tablet Take 0.5 mg by mouth at bedtime as needed. Sleep      . chlorthalidone (HYGROTON) 25 MG tablet       . diltiazem (CARDIZEM CD) 300 MG 24 hr capsule Take 1 capsule (300 mg total) by mouth daily.  90 capsule  3  . HYDROcodone-acetaminophen (LORTAB) 10-500 MG per tablet Take 2 tablets by mouth every 6 (six) hours as needed. Pain      . hydrocortisone (CORTEF) 10 MG tablet Take 10 mg by mouth daily.      . hydrocortisone cream 1 % Apply topically 2 (two) times daily.  30 g  0  . levothyroxine (SYNTHROID, LEVOTHROID) 125 MCG tablet Take 125 mcg by mouth daily.      . mirabegron ER (MYRBETRIQ) 25 MG TB24 tablet Take 25 mg by mouth daily.      . pantoprazole (PROTONIX) 40 MG tablet Take 40 mg by mouth 2 (two) times daily.        Marland Kitchen  phenazopyridine (PYRIDIUM) 100 MG tablet Take 100 mg by mouth 3 (three) times daily as needed.      . prednisoLONE acetate (PRED FORTE) 1 % ophthalmic suspension       . Probiotic Product (ALIGN) 4 MG CAPS Take 4 mg by mouth daily.      . propranolol (INDERAL) 80 MG tablet Take 80 mg by mouth daily.        Marland Kitchen spironolactone (ALDACTONE) 25 MG tablet Take 0.5 tablets (12.5 mg total) by mouth daily.  30 tablet  6  . warfarin (COUMADIN) 2.5 MG tablet Take 1 tablet (2.5 mg total) by mouth every morning.  90 tablet  3  . oxyCODONE-acetaminophen (PERCOCET/ROXICET) 5-325 MG per tablet Take 1 tablet by mouth every 6 (six) hours as needed.        No current facility-administered medications for this visit.    Allergies as of 02/12/2013 - Review Complete 02/12/2013  Allergen Reaction Noted  . Amitiza  [lubiprostone] Swelling 01/10/2012  . Ciprofloxacin Nausea And Vomiting 06/07/2011  . Clindamycin/lincomycin  12/30/2012  . Doxycycline  12/30/2012  . Sulfonamide derivatives Nausea And Vomiting and Rash 10/23/2007    Family History  Problem Relation Age of Onset  . Stroke Mother   . Heart attack Father   . Breast cancer Sister   . Colon cancer Neg Hx     History   Social History  . Marital Status: Single    Spouse Name: N/A    Number of Children: N/A  . Years of Education: N/A   Occupational History  . RETIRED    Social History Main Topics  . Smoking status: Never Smoker   . Smokeless tobacco: Never Used  . Alcohol Use: No  . Drug Use: No  . Sexual Activity: No   Other Topics Concern  . None   Social History Narrative  . None    Review of Systems: Gen: see HPI CV: intermittent chest discomfort Resp: Denies dyspnea at rest, cough, wheezing, coughing up blood, and pleurisy. GI: see HPI Derm: Denies rash, itching, dry skin Psych: +depression/anxiety Heme: Denies bruising, bleeding, and enlarged lymph nodes.  Physical Exam: BP 130/75  Pulse 72  Temp(Src) 98.3 F (36.8 C) (Oral)  Ht 5\' 2"  (1.575 m)  Wt 140 lb (63.504 kg)  BMI 25.60 kg/m2 General:   Alert and oriented. Mild anxiety but pleasant Head:  Normocephalic and atraumatic. Eyes:  Conjuctiva clear without scleral icterus. Mouth:  Oral mucosa pink and moist. Good dentition. No lesions. Neck:  Supple, without mass or thyromegaly. Heart:  S1, S2 present, irregularly irregular, systolic murmur Abdomen:  +BS, soft, mild TTP lower abdomen, protuberant but without rebound or guarding.  Extremities:  Without edema. Neurologic:  Alert and  oriented x4;  grossly normal neurologically. Skin:  Intact without significant lesions or rashes. Psych:  Alert and cooperative. Mild anxiety  CT Oct 2013: Without acute findings.  I spoke with radiologist at [facility] at time of visit who was able to see origins of SMA,  IMA, and celiac. Very little calfications noted. If further concern, needs CTA.

## 2013-02-28 NOTE — Interval H&P Note (Signed)
History and Physical Interval Note:  02/28/2013 10:48 AM  [name]  has presented today for surgery, with the diagnosis of DYSPHAGIA  AND ABDOMINAL PAIN  The various methods of treatment have been discussed with the patient and family. After consideration of risks, benefits and other options for treatment, the patient has consented to  Procedure(s) with comments: ESOPHAGOGASTRODUODENOSCOPY (EGD) WITH ESOPHAGEAL DILATION (N/A) - 11:00 as a surgical intervention .  The patient's history has been reviewed, patient examined, no change in status, stable for surgery.  I have reviewed the patient's chart and labs.  Questions were answered to the patient's satisfaction.     [name]   No change. EGD with dilation as appropriate per plan.The risks, benefits, limitations, alternatives and imponderables have been reviewed with the patient. Potential for esophageal dilation, biopsy, etc. have also been reviewed.  Questions have been answered. All parties agreeable.

## 2013-03-03 ENCOUNTER — Telehealth: Payer: Self-pay | Admitting: *Deleted

## 2013-03-03 NOTE — Telephone Encounter (Signed)
Pt called stating her power went out to call her cell phone [phone] if we need anything. [phone] is home phone number pt asked about her results, I made pt aware it will take 7 business days for those to come back. Pt states by then her landline should be working to call it, if can't reach at that number call her cell phone.

## 2013-03-04 ENCOUNTER — Encounter (HOSPITAL_COMMUNITY): Payer: Self-pay | Admitting: Internal Medicine

## 2013-03-05 ENCOUNTER — Encounter: Payer: Self-pay | Admitting: Internal Medicine

## 2013-03-11 ENCOUNTER — Telehealth: Payer: Self-pay

## 2013-03-11 NOTE — Telephone Encounter (Signed)
Pt called- she c/o abd pain- she is taking the protonix bid and it helps some. It "aches all the time" per pt. Pt was feeling better when she was taking the linzess 264mcg, she told RMR that she had a rash and he told her to stop, she said she has eczema and has a rash all the time, she still has some of the medication and would like to restart it.  Please advise if this is ok.  pts phone cut off and I tried to call her back but the call would not go through. Will call her back tomorrow.

## 2013-03-13 ENCOUNTER — Telehealth: Payer: Self-pay | Admitting: *Deleted

## 2013-03-13 NOTE — Telephone Encounter (Signed)
Pt called stating Dr. Gala Romney removed a polyp Friday the 9th, pt states she is having abd. Pain, pt states she needs something for pain for her stomach pt states she is taking linzess and pantoprazola 40mg  that helps her go to the bathroom. pt called her PCP and said they were closed, pt made a appointment today it is 04/03/13 with ANNA SAMS at 11:00. Pt states she uses [place] aprox. Pt stated she got a letter saying [facility] is going to need approval for her medications. Please advise [phone]

## 2013-03-13 NOTE — Telephone Encounter (Signed)
She can resume Linzess; if she has any problems such as worsening rash, stop medication and let us know

## 2013-03-14 NOTE — Telephone Encounter (Signed)
Pt is aware.  

## 2013-03-14 NOTE — Telephone Encounter (Signed)
I spoke with pt. See other phone note.

## 2013-04-03 ENCOUNTER — Ambulatory Visit: Payer: Medicare Other | Admitting: Gastroenterology

## 2013-04-24 LAB — BASIC METABOLIC PANEL
BUN: 28 mg/dL — ABNORMAL HIGH (ref 6–23)
CO2: 28 mEq/L (ref 19–32)
Calcium: 9.6 mg/dL (ref 8.4–10.5)
Chloride: 97 mEq/L (ref 96–112)
Creat: 1.11 mg/dL — ABNORMAL HIGH (ref 0.50–1.10)
Glucose, Bld: 101 mg/dL — ABNORMAL HIGH (ref 70–99)
Potassium: 4.4 mEq/L (ref 3.5–5.3)
Sodium: 135 mEq/L (ref 135–145)

## 2013-05-07 ENCOUNTER — Encounter: Payer: Self-pay | Admitting: Cardiology

## 2013-05-07 ENCOUNTER — Ambulatory Visit (INDEPENDENT_AMBULATORY_CARE_PROVIDER_SITE_OTHER): Payer: Medicare Other | Admitting: Cardiology

## 2013-05-07 ENCOUNTER — Ambulatory Visit (INDEPENDENT_AMBULATORY_CARE_PROVIDER_SITE_OTHER): Payer: Medicare Other | Admitting: *Deleted

## 2013-05-07 VITALS — BP 122/60 | HR 62 | Ht 61.0 in | Wt 138.6 lb

## 2013-05-07 DIAGNOSIS — I4891 Unspecified atrial fibrillation: Secondary | ICD-10-CM

## 2013-05-07 DIAGNOSIS — I059 Rheumatic mitral valve disease, unspecified: Secondary | ICD-10-CM

## 2013-05-07 DIAGNOSIS — Z7901 Long term (current) use of anticoagulants: Secondary | ICD-10-CM

## 2013-05-07 DIAGNOSIS — Z5181 Encounter for therapeutic drug level monitoring: Secondary | ICD-10-CM | POA: Insufficient documentation

## 2013-05-07 DIAGNOSIS — I359 Nonrheumatic aortic valve disorder, unspecified: Secondary | ICD-10-CM

## 2013-05-07 LAB — POCT INR: INR: 2.8

## 2013-05-07 NOTE — Assessment & Plan Note (Signed)
Stable examination, mild mitral stenosis and regurgitation.

## 2013-05-07 NOTE — Assessment & Plan Note (Signed)
Continue current regimen including Cardizem CD and Coumadin.

## 2013-05-07 NOTE — Progress Notes (Signed)
Clinical Summary [name] is an 80 y.o.female last seen in December 2014. Overall, she has been doing fairly well from a cardiac standpoint. She has occasional palpitations, states that her leg edema is much better. No major changes in her cardiac regimen.  Recent followup lab work from March showed potassium 4.4, BUN 28, creatinine 1.1. Weight is down a few pounds from last visit.  Echocardiogram from May 2013 demonstrated mild basal septal hypertrophy with LVEF 55-60%, mild to moderate aortic regurgitation, restricted posterior mitral leaflet motion with mild stenosis and mild regurgitation, moderately dilated left atrium and right atrium.   Allergies  Allergen Reactions  . Amitiza [Lubiprostone] Swelling  . Ciprofloxacin Nausea And Vomiting  . Clindamycin/Lincomycin     Nauseated/headache  . Doxycycline     Nauseated/headache  . Sulfonamide Derivatives Nausea And Vomiting and Rash    Current Outpatient Prescriptions  Medication Sig Dispense Refill  . ALPRAZolam (XANAX) 0.5 MG tablet Take 0.5 mg by mouth at bedtime as needed. Sleep      . diltiazem (CARDIZEM CD) 300 MG 24 hr capsule Take 1 capsule (300 mg total) by mouth daily.  90 capsule  3  . HYDROcodone-acetaminophen (LORTAB) 10-500 MG per tablet Take 2 tablets by mouth every 6 (six) hours as needed. Pain      . hydrocortisone (CORTEF) 10 MG tablet Take 10 mg by mouth every other day.       . hydrocortisone cream 1 % Apply 1 application topically daily as needed for itching.      . levothyroxine (SYNTHROID, LEVOTHROID) 125 MCG tablet Take 125 mcg by mouth daily.      . mirabegron ER (MYRBETRIQ) 25 MG TB24 tablet Take 25 mg by mouth daily.      . Probiotic Product (ALIGN) 4 MG CAPS Take 4 mg by mouth daily.      . propranolol (INDERAL) 80 MG tablet Take 80 mg by mouth daily.        Marland Kitchen spironolactone (ALDACTONE) 25 MG tablet Take 0.5 tablets (12.5 mg total) by mouth daily.  30 tablet  6  . warfarin (COUMADIN) 2.5 MG tablet  Take 2.5 mg by mouth every morning. 2.5 mg on Monday, Wednesday, and Friday. 1.25 mg (1/2 tablet) on Tuesday,Thursday, Sat, and Sunday.       No current facility-administered medications for this visit.    Past Medical History  Diagnosis Date  . Rheumatic heart disease   . IBS (irritable bowel syndrome)   . GERD (gastroesophageal reflux disease)   . Breast cancer   . Mitral regurgitation   . Mitral stenosis   . Atrial fibrillation   . PAT (paroxysmal atrial tachycardia)   . Hypothyroidism   . Melanoma of neck   . Microscopic colitis 2003    Was maintained on Asacol BID  . S/P colonoscopy April 2010    Friable anal canal, left-sided diverticula, adenomatous polyp  . S/P endoscopy April 2010    Benign polyp, s/p 54-F Maloney dilation  . Hiatal hernia   . Diverticula of colon   . Tubulovillous adenoma   . Adenomatous polyp   . Sleep apnea     CPAP at night  . History of cardiac catheterization     Normal coronaries in 2006    Social History [name] reports that she has never smoked. She has never used smokeless tobacco. [name] reports that she does not drink alcohol.  Review of Systems Still feels abdominal bloating. Keeps followup with Dr.  Rourk. No fevers or chills. Stable appetite. Otherwise negative.  Physical Examination Filed Vitals:   05/07/13 0832  BP: 122/60  Pulse: 62   Filed Weights   05/07/13 0832  Weight: 138 lb 9.6 oz (62.869 kg)    Elderly woman, comfortable at rest.  HEENT: Conjunctiva and lids are normal, oropharynx clear.  Neck: Supple, no elevated jugular venous pressure, no carotid bruits. No thyromegaly.  Lungs: Diminished breath sounds, no rales, nonlabored.  Cardiac: Irregularly irregular, 2/6 apical systolic murmur, no S3 gallop, no pericardial rub.  Abdomen: Soft, protuberant, bowel sounds present.  Extremities: No significant edema with erythema, distal pulses one plus.  Neuropsychiatric: Alert oriented x3, affect appropriate.    Skin: Warm and dry.  Musculoskeletal: Kyphosis noted.   Problem List and Plan   Atrial fibrillation Continue current regimen including Cardizem CD and Coumadin.  Mitral valve disease Stable examination, mild mitral stenosis and regurgitation.  Aortic valve disease Stable examination, mild to moderate aortic regurgitation.    Satira Sark, M.D., F.A.C.C.

## 2013-05-07 NOTE — Assessment & Plan Note (Signed)
Stable examination, mild to moderate aortic regurgitation.

## 2013-05-07 NOTE — Patient Instructions (Signed)
Your physician wants you to follow-up in: [place] will receive a reminder letter in the mail two months in advance. If you don't receive a letter, please call our office to schedule the follow-up appointment.

## 2013-05-21 ENCOUNTER — Ambulatory Visit (INDEPENDENT_AMBULATORY_CARE_PROVIDER_SITE_OTHER): Payer: Medicare Other | Admitting: Gastroenterology

## 2013-05-21 ENCOUNTER — Telehealth: Payer: Self-pay | Admitting: *Deleted

## 2013-05-21 ENCOUNTER — Encounter: Payer: Self-pay | Admitting: Gastroenterology

## 2013-05-21 VITALS — BP 115/67 | HR 74 | Temp 98.0°F | Ht 62.0 in | Wt 135.0 lb

## 2013-05-21 DIAGNOSIS — Z860100 Personal history of colon polyps, unspecified: Secondary | ICD-10-CM

## 2013-05-21 DIAGNOSIS — R109 Unspecified abdominal pain: Secondary | ICD-10-CM | POA: Insufficient documentation

## 2013-05-21 DIAGNOSIS — K5900 Constipation, unspecified: Secondary | ICD-10-CM

## 2013-05-21 MED ORDER — ESOMEPRAZOLE MAGNESIUM 40 MG PO CPDR: 40.0000 mg | DELAYED_RELEASE_CAPSULE | Freq: Every day | ORAL | Status: DC

## 2013-05-21 NOTE — Assessment & Plan Note (Addendum)
Surveillance due in 2018.

## 2013-05-21 NOTE — Patient Instructions (Addendum)
Take Colace twice a day; hold if you have any diarrhea.   Stop Pantoprazole (Protonix). Start taking Nexium each morning, 30 minutes before breakfast. I have provided samples and sent the prescription to [facility].   I have ordered a CT scan of your belly. We will be in touch with the results soon!

## 2013-05-21 NOTE — Telephone Encounter (Signed)
Pt called stating Vicente Males can call that RX in for her at [place] aprox. Pt said her insurance will cover it. Please advise [phone]

## 2013-05-21 NOTE — Progress Notes (Signed)
cc'd to pcp 

## 2013-05-21 NOTE — Telephone Encounter (Signed)
It has already been sent

## 2013-05-21 NOTE — Assessment & Plan Note (Signed)
Unable to tolerate Linzess or Amitiza due to rash. Miralax "burns". Take Colace BID regularly.

## 2013-05-21 NOTE — Assessment & Plan Note (Signed)
With associated lack of appetite, weight loss. Last CT several years ago. As documented at last visit, low threshold for proceeding with CTA to rule out chronic mesenteric ischemia. Pain may also be secondary to adhesive disease, IBS-C. CTA now.

## 2013-05-21 NOTE — Progress Notes (Signed)
Referring Provider: Redmond School, MD Primary Care Physician:  [name], MD Primary GI: Dr. Gala Romney   Chief Complaint  Patient presents with  . Follow-up    HPI:   [name] presents today in follow-up with a history of abdominal pain, constipation, nausea, and dysphagia. EGD recently with reflux esophagitis, empiric dilation.  Unable to take Linzess or Amitiza, as it causes a rash. Miralax burns going down. Takes Colace three at a time every few days.   Poor appetite. 150 in Jan 2014. Now 135. Aches across lower abdomen, constant. Not eating much. Just yogurt and ensure. Pain seems to be worse with eating. Feels like an "inflammation" in there. Nausea usually all the time. Taking Protonix BID. Feels like she needs something else.   Past Medical History  Diagnosis Date  . Rheumatic heart disease   . IBS (irritable bowel syndrome)   . GERD (gastroesophageal reflux disease)   . Breast cancer   . Mitral regurgitation   . Mitral stenosis   . Atrial fibrillation   . PAT (paroxysmal atrial tachycardia)   . Hypothyroidism   . Melanoma of neck   . Microscopic colitis 2003    Was maintained on Asacol BID  . S/P colonoscopy April 2010    Friable anal canal, left-sided diverticula, adenomatous polyp  . S/P endoscopy April 2010    Benign polyp, s/p 54-F Maloney dilation  . Hiatal hernia   . Diverticula of colon   . Tubulovillous adenoma   . Adenomatous polyp   . Sleep apnea     CPAP at night  . History of cardiac catheterization     Normal coronaries in 2006    Past Surgical History  Procedure Laterality Date  . Abdominal hysterectomy    . Melanoma removal      SURGERY NECK  . Cataracts    . Hemorrhoid surgery    . Tonsillectomy    . Colonoscopy  06/12/2008    Dr. Laurine Blazer anal canal, left-sided diverticula, adenomatous polyp  . Esophagogastroduodenoscopy  06/12/2008    Dr. Marice Potter polyp, s/p 54-F Venia Minks dilation  . Colonoscopy  09/28/2011    Dr.  Gala Romney- tubular adenoma, colonic diverticulosis  . Cholecystectomy    . Mastectomy      RIGHT BREAST, followed by left breast after radiation  . Esophagogastroduodenoscopy (egd) with esophageal dilation N/A 02/28/2013    Dr. Rourk:Mild changes of reflux esophagitis. Status post passage of a Maloney dilator. Hiatal hernia. Gastric erosions and gastric polyps-, benign fundic gland polyp    Current Outpatient Prescriptions  Medication Sig Dispense Refill  . ALPRAZolam (XANAX) 0.5 MG tablet Take 0.5 mg by mouth at bedtime as needed. Sleep      . diltiazem (CARDIZEM CD) 300 MG 24 hr capsule Take 1 capsule (300 mg total) by mouth daily.  90 capsule  3  . HYDROcodone-acetaminophen (LORTAB) 10-500 MG per tablet Take 2 tablets by mouth every 6 (six) hours as needed. Pain      . hydrocortisone (CORTEF) 10 MG tablet Take 10 mg by mouth every other day.       . hydrocortisone cream 1 % Apply 1 application topically daily as needed for itching.      . levothyroxine (SYNTHROID, LEVOTHROID) 125 MCG tablet Take 125 mcg by mouth daily.      . mirabegron ER (MYRBETRIQ) 25 MG TB24 tablet Take 25 mg by mouth daily.      . pantoprazole (PROTONIX) 40 MG tablet Take 40 mg  by mouth 2 (two) times daily before a meal.      . Probiotic Product (ALIGN) 4 MG CAPS Take 4 mg by mouth daily.      . propranolol (INDERAL) 80 MG tablet Take 80 mg by mouth daily.        Marland Kitchen spironolactone (ALDACTONE) 25 MG tablet Take 0.5 tablets (12.5 mg total) by mouth daily.  30 tablet  6  . warfarin (COUMADIN) 2.5 MG tablet Take 2.5 mg by mouth every morning. 2.5 mg on Monday, Wednesday, and Friday. 1.25 mg (1/2 tablet) on Tuesday,Thursday, Sat, and Sunday.       No current facility-administered medications for this visit.    Allergies as of 05/21/2013 - Review Complete 05/21/2013  Allergen Reaction Noted  . Amitiza [lubiprostone] Swelling 01/10/2012  . Ciprofloxacin Nausea And Vomiting 06/07/2011  . Clindamycin/lincomycin  12/30/2012  .  Doxycycline  12/30/2012  . Sulfonamide derivatives Nausea And Vomiting and Rash 10/23/2007    Family History  Problem Relation Age of Onset  . Stroke Mother   . Heart attack Father   . Breast cancer Sister   . Colon cancer Neg Hx     History   Social History  . Marital Status: Single    Spouse Name: N/A    Number of Children: N/A  . Years of Education: N/A   Occupational History  . RETIRED    Social History Main Topics  . Smoking status: Never Smoker   . Smokeless tobacco: Never Used  . Alcohol Use: No  . Drug Use: No  . Sexual Activity: No   Other Topics Concern  . None   Social History Narrative  . None    Review of Systems: As mentioned in HPI.   Physical Exam: BP 115/67  Pulse 74  Temp(Src) 98 F (36.7 C) (Oral)  Ht 5\' 2"  (1.575 m)  Wt 135 lb (61.236 kg)  BMI 24.69 kg/m2 General:   Alert and oriented. No distress noted. Pleasant and cooperative.  Head:  Normocephalic and atraumatic. Abdomen:  +BS, soft, TTP lower abdomen, mildline incision noted, well-healed.  No rebound or guarding. No HSM or masses noted. Extremities:  Without edema. Neurologic:  Alert and  oriented x4;  grossly normal neurologically. Skin:  Intact without significant lesions or rashes. Psych:  Alert and cooperative. Normal mood and affect.

## 2013-05-28 ENCOUNTER — Encounter (HOSPITAL_COMMUNITY): Payer: Self-pay

## 2013-05-28 ENCOUNTER — Ambulatory Visit (HOSPITAL_COMMUNITY)
Admission: RE | Admit: 2013-05-28 | Discharge: 2013-05-28 | Disposition: A | Discharge status: Home / Self Care | Payer: Medicare Other | Source: Ambulatory Visit | Attending: Gastroenterology | Admitting: Gastroenterology

## 2013-05-28 DIAGNOSIS — R109 Unspecified abdominal pain: Secondary | ICD-10-CM

## 2013-05-28 DIAGNOSIS — R110 Nausea: Secondary | ICD-10-CM | POA: Insufficient documentation

## 2013-05-28 DIAGNOSIS — R634 Abnormal weight loss: Secondary | ICD-10-CM | POA: Insufficient documentation

## 2013-05-28 MED ORDER — IOHEXOL 350 MG/ML SOLN
100.0000 mL | Freq: Once | INTRAVENOUS | Status: AC | PRN
  Administered 2013-05-28: 100 mL via INTRAVENOUS

## 2013-05-28 NOTE — Progress Notes (Signed)
Quick Note:  CTA reviewed. No evidence of significant occlusive disease.  How is patient doing with constipation?  Recommend OV follow-up with RMR only.   ______

## 2013-06-03 NOTE — Progress Notes (Signed)
Quick Note:  Take Colace BID. Miralax daily. If she isn't on a probiotic, advise taking once daily.  PR in 5 days. ______

## 2013-07-01 ENCOUNTER — Encounter (INDEPENDENT_AMBULATORY_CARE_PROVIDER_SITE_OTHER): Payer: Self-pay

## 2013-07-01 ENCOUNTER — Encounter: Payer: Self-pay | Admitting: Internal Medicine

## 2013-07-01 ENCOUNTER — Ambulatory Visit (INDEPENDENT_AMBULATORY_CARE_PROVIDER_SITE_OTHER): Payer: Medicare Other | Admitting: Internal Medicine

## 2013-07-01 VITALS — BP 118/69 | HR 83 | Temp 97.2°F | Ht 62.0 in | Wt 141.2 lb

## 2013-07-01 DIAGNOSIS — K219 Gastro-esophageal reflux disease without esophagitis: Secondary | ICD-10-CM

## 2013-07-01 DIAGNOSIS — K5900 Constipation, unspecified: Secondary | ICD-10-CM

## 2013-07-01 LAB — HEPATIC FUNCTION PANEL
ALK PHOS: 72 U/L (ref 39–117)
ALT: 15 U/L (ref 0–35)
AST: 26 U/L (ref 0–37)
Albumin: 4.2 g/dL (ref 3.5–5.2)
BILIRUBIN DIRECT: 0.2 mg/dL (ref 0.0–0.3)
BILIRUBIN INDIRECT: 0.7 mg/dL (ref 0.2–1.2)
TOTAL PROTEIN: 7.3 g/dL (ref 6.0–8.3)
Total Bilirubin: 0.9 mg/dL (ref 0.2–1.2)

## 2013-07-01 NOTE — Progress Notes (Signed)
Primary Care Physician:  [name], MD Primary Gastroenterologist:  Dr. Gala Romney  Pre-Procedure History & Physical: HPI:  [name] is a 80 y.o. female here for followup of constipation, history of weight loss, anorexia and some early satiety. She has gained 6 pounds since her last office visit here CT angiogram abdomen demonstrated noncritical atherosclerotic disease. No other significant GI findings aside from diffuse biliary dilation status post cholecystectomy. No recent LFTs.. She is chronically constipated. Efforts at managing constipation been thwarted by her multiple intolerances (MiraLax, Linzess, Amitiza).  Patient denies pain with eating. Bowel movements infrequent. No melena or rectal bleeding. Reflux symptoms much better with Nexium rather Protonix.  Patient states Dr. Gerarda Fraction recently diagnosed toenail fungus.  Past Medical History  Diagnosis Date  . Rheumatic heart disease   . IBS (irritable bowel syndrome)   . GERD (gastroesophageal reflux disease)   . Breast cancer   . Mitral regurgitation   . Mitral stenosis   . Atrial fibrillation   . PAT (paroxysmal atrial tachycardia)   . Hypothyroidism   . Melanoma of neck   . Microscopic colitis 2003    Was maintained on Asacol BID  . S/P colonoscopy April 2010    Friable anal canal, left-sided diverticula, adenomatous polyp  . S/P endoscopy April 2010    Benign polyp, s/p 54-F Maloney dilation  . Hiatal hernia   . Diverticula of colon   . Tubulovillous adenoma   . Adenomatous polyp   . Sleep apnea     CPAP at night  . History of cardiac catheterization     Normal coronaries in 2006    Past Surgical History  Procedure Laterality Date  . Abdominal hysterectomy    . Melanoma removal      SURGERY NECK  . Cataracts    . Hemorrhoid surgery    . Tonsillectomy    . Colonoscopy  06/12/2008    Dr. Laurine Blazer anal canal, left-sided diverticula, adenomatous polyp  . Esophagogastroduodenoscopy  06/12/2008    Dr.  Marice Potter polyp, s/p 54-F Venia Minks dilation  . Colonoscopy  09/28/2011    Dr. Gala Romney- tubular adenoma, colonic diverticulosis  . Cholecystectomy    . Mastectomy      RIGHT BREAST, followed by left breast after radiation  . Esophagogastroduodenoscopy (egd) with esophageal dilation N/A 02/28/2013    Dr. Henretter Piekarski:Mild changes of reflux esophagitis. Status post passage of a Maloney dilator. Hiatal hernia. Gastric erosions and gastric polyps-, benign fundic gland polyp    Prior to Admission medications   Medication Sig Start Date End Date Taking? Authorizing Provider  ALPRAZolam Duanne Moron) 0.5 MG tablet Take 0.5 mg by mouth at bedtime as needed. Sleep 06/22/11  Yes Historical Provider, MD  diltiazem (CARDIZEM CD) 300 MG 24 hr capsule Take 1 capsule (300 mg total) by mouth daily. 02/06/13  Yes Satira Sark, MD  esomeprazole (NEXIUM) 40 MG capsule Take 1 capsule (40 mg total) by mouth daily before breakfast. 05/21/13  Yes Orvil Feil, NP  furosemide (LASIX) 20 MG tablet Take 20 mg by mouth as needed.   Yes Historical Provider, MD  HYDROcodone-acetaminophen (LORTAB) 10-500 MG per tablet Take 2 tablets by mouth every 6 (six) hours as needed. Pain   Yes Historical Provider, MD  hydrocortisone (CORTEF) 10 MG tablet Take 10 mg by mouth every other day.    Yes Historical Provider, MD  hydrocortisone cream 1 % Apply 1 application topically daily as needed for itching. 03/01/12  Yes Nimish Luther Parody, MD  levothyroxine (  SYNTHROID, LEVOTHROID) 125 MCG tablet Take 125 mcg by mouth daily.   Yes Historical Provider, MD  mirabegron ER (MYRBETRIQ) 25 MG TB24 tablet Take 25 mg by mouth daily.   Yes Historical Provider, MD  Probiotic Product (ALIGN) 4 MG CAPS Take 4 mg by mouth daily.   Yes Historical Provider, MD  propranolol (INDERAL) 80 MG tablet Take 80 mg by mouth daily.     Yes Historical Provider, MD  spironolactone (ALDACTONE) 25 MG tablet Take 0.5 tablets (12.5 mg total) by mouth daily. 12/30/12  Yes Satira Sark, MD  warfarin (COUMADIN) 2.5 MG tablet Take 2.5 mg by mouth every morning. 2.5 mg on Monday, Wednesday, and Friday. 1.25 mg (1/2 tablet) on Tuesday,Thursday, Sat, and Sunday. 02/06/13  Yes Satira Sark, MD  pantoprazole (PROTONIX) 40 MG tablet Take 40 mg by mouth 2 (two) times daily before a meal.    Historical Provider, MD    Allergies as of 07/01/2013 - Review Complete 07/01/2013  Allergen Reaction Noted  . Amitiza [lubiprostone] Swelling 01/10/2012  . Ciprofloxacin Nausea And Vomiting 06/07/2011  . Clindamycin/lincomycin  12/30/2012  . Doxycycline  12/30/2012  . Linzess [linaclotide] Swelling and Rash 02/27/2012  . Sulfonamide derivatives Nausea And Vomiting and Rash 10/23/2007    Family History  Problem Relation Age of Onset  . Stroke Mother   . Heart attack Father   . Breast cancer Sister   . Colon cancer Neg Hx     History   Social History  . Marital Status: Single    Spouse Name: N/A    Number of Children: N/A  . Years of Education: N/A   Occupational History  . RETIRED    Social History Main Topics  . Smoking status: Never Smoker   . Smokeless tobacco: Never Used  . Alcohol Use: No  . Drug Use: No  . Sexual Activity: No   Other Topics Concern  . Not on file   Social History Narrative  . No narrative on file    Review of Systems: See HPI, otherwise negative ROS  Physical Exam: BP 118/69  Pulse 83  Temp(Src) 97.2 F (36.2 C) (Oral)  Ht 5\' 2"  (1.575 m)  Wt 141 lb 3.2 oz (64.048 kg)  BMI 25.82 kg/m2 General:   Alert,  somewhat fretful, pleasant and cooperative in NAD Skin:  Intact without significant lesions or rashes. Eyes:  Sclera clear, no icterus.   Conjunctiva pink. Ears:  Normal auditory acuity. Nose:  No deformity, discharge,  or lesions. Mouth:  No deformity or lesions. Neck:  Supple; no masses or thyromegaly. No significant cervical adenopathy. Lungs:  Clear throughout to auscultation.   No wheezes, crackles, or rhonchi. No  acute distress. Heart:  Regular rate and rhythm; no murmurs, clicks, rubs,  or gallops. Abdomen: Non-distended, normal bowel sounds.  Soft and nontender without appreciable mass or hepatosplenomegaly.  Pulses:  Normal pulses noted. Extremities:  Without clubbing or edema.  Impression:    Pleasant 80 year old lady with multiple comorbidities continues to complain of some anorexia, early satiety and chronic constipation. Weight is up 6 pounds. This May Be a Fluid but, Clinically, She Does Not Look Particularly Fluid Overloaded Today. She Gets along Well with Ensure with Meals and Snacks. Reflux Symptoms Well Controlled on Protonix.  Chronically dilated biliary tree status post cholecystectomy. Recent EGD in CTA are reassuring otherwise.  Constipation continues to be a challenge and is likely contributing to some of her anorexia symptoms.  Recommendations:  Hepatic function  profile  Continue Colace daily  Continue Nexium daily  Add Senokot laxative daily as needed  Continue probiotic daily  Office visit in 3 months

## 2013-07-01 NOTE — Patient Instructions (Signed)
Hepatic function profile  Continue Colace daily  Continue Nexium daily  Add Senokot laxative daily as needed  Continue probiotic daily  Office visit in 3 months

## 2013-07-21 ENCOUNTER — Other Ambulatory Visit: Payer: Self-pay | Admitting: *Deleted

## 2013-07-21 DIAGNOSIS — R600 Localized edema: Secondary | ICD-10-CM

## 2013-07-23 ENCOUNTER — Ambulatory Visit (INDEPENDENT_AMBULATORY_CARE_PROVIDER_SITE_OTHER): Payer: Medicare Other | Admitting: *Deleted

## 2013-07-23 ENCOUNTER — Other Ambulatory Visit: Payer: Self-pay | Admitting: Cardiovascular Disease

## 2013-07-23 DIAGNOSIS — I4891 Unspecified atrial fibrillation: Secondary | ICD-10-CM

## 2013-07-23 DIAGNOSIS — Z5181 Encounter for therapeutic drug level monitoring: Secondary | ICD-10-CM

## 2013-07-23 LAB — PROTIME-INR
INR: 7.25 — AB (ref <1.50)
Prothrombin Time: 59 seconds — ABNORMAL HIGH (ref 11.6–15.2)

## 2013-07-23 LAB — POCT INR: INR: >8

## 2013-07-28 ENCOUNTER — Ambulatory Visit (INDEPENDENT_AMBULATORY_CARE_PROVIDER_SITE_OTHER): Payer: Medicare Other | Admitting: *Deleted

## 2013-07-28 DIAGNOSIS — Z5181 Encounter for therapeutic drug level monitoring: Secondary | ICD-10-CM

## 2013-07-28 DIAGNOSIS — I4891 Unspecified atrial fibrillation: Secondary | ICD-10-CM

## 2013-07-28 LAB — POCT INR: INR: 2.2

## 2013-08-11 ENCOUNTER — Ambulatory Visit (INDEPENDENT_AMBULATORY_CARE_PROVIDER_SITE_OTHER): Payer: Medicare Other | Admitting: *Deleted

## 2013-08-11 DIAGNOSIS — I4891 Unspecified atrial fibrillation: Secondary | ICD-10-CM

## 2013-08-11 DIAGNOSIS — Z5181 Encounter for therapeutic drug level monitoring: Secondary | ICD-10-CM

## 2013-08-11 LAB — POCT INR: INR: 1.8

## 2013-08-11 MED ORDER — WARFARIN SODIUM 2.5 MG PO TABS: ORAL_TABLET | ORAL | Status: DC

## 2013-08-13 ENCOUNTER — Emergency Department (HOSPITAL_COMMUNITY): Payer: Medicare Other

## 2013-08-13 ENCOUNTER — Encounter (HOSPITAL_COMMUNITY): Payer: Self-pay | Admitting: Emergency Medicine

## 2013-08-13 ENCOUNTER — Emergency Department (HOSPITAL_COMMUNITY)
Admission: EM | Admit: 2013-08-13 | Discharge: 2013-08-13 | Disposition: A | Discharge status: Home / Self Care | Payer: Medicare Other | Attending: Emergency Medicine | Admitting: Emergency Medicine

## 2013-08-13 DIAGNOSIS — K219 Gastro-esophageal reflux disease without esophagitis: Secondary | ICD-10-CM | POA: Diagnosis not present

## 2013-08-13 DIAGNOSIS — R5381 Other malaise: Secondary | ICD-10-CM

## 2013-08-13 DIAGNOSIS — Z872 Personal history of diseases of the skin and subcutaneous tissue: Secondary | ICD-10-CM | POA: Diagnosis not present

## 2013-08-13 DIAGNOSIS — R110 Nausea: Secondary | ICD-10-CM

## 2013-08-13 DIAGNOSIS — IMO0002 Reserved for concepts with insufficient information to code with codable children: Secondary | ICD-10-CM | POA: Insufficient documentation

## 2013-08-13 DIAGNOSIS — E039 Hypothyroidism, unspecified: Secondary | ICD-10-CM | POA: Diagnosis not present

## 2013-08-13 DIAGNOSIS — Z860100 Personal history of colon polyps, unspecified: Secondary | ICD-10-CM | POA: Insufficient documentation

## 2013-08-13 DIAGNOSIS — Z853 Personal history of malignant neoplasm of breast: Secondary | ICD-10-CM | POA: Diagnosis not present

## 2013-08-13 DIAGNOSIS — Z79899 Other long term (current) drug therapy: Secondary | ICD-10-CM | POA: Diagnosis not present

## 2013-08-13 DIAGNOSIS — M7989 Other specified soft tissue disorders: Secondary | ICD-10-CM | POA: Diagnosis not present

## 2013-08-13 DIAGNOSIS — G4730 Sleep apnea, unspecified: Secondary | ICD-10-CM | POA: Insufficient documentation

## 2013-08-13 DIAGNOSIS — I099 Rheumatic heart disease, unspecified: Secondary | ICD-10-CM | POA: Insufficient documentation

## 2013-08-13 DIAGNOSIS — I471 Supraventricular tachycardia, unspecified: Secondary | ICD-10-CM | POA: Insufficient documentation

## 2013-08-13 DIAGNOSIS — I4891 Unspecified atrial fibrillation: Secondary | ICD-10-CM | POA: Diagnosis not present

## 2013-08-13 DIAGNOSIS — R1900 Intra-abdominal and pelvic swelling, mass and lump, unspecified site: Secondary | ICD-10-CM | POA: Diagnosis not present

## 2013-08-13 DIAGNOSIS — I050 Rheumatic mitral stenosis: Secondary | ICD-10-CM | POA: Insufficient documentation

## 2013-08-13 DIAGNOSIS — Z9981 Dependence on supplemental oxygen: Secondary | ICD-10-CM | POA: Insufficient documentation

## 2013-08-13 DIAGNOSIS — I059 Rheumatic mitral valve disease, unspecified: Secondary | ICD-10-CM | POA: Insufficient documentation

## 2013-08-13 DIAGNOSIS — R5383 Other fatigue: Secondary | ICD-10-CM | POA: Diagnosis present

## 2013-08-13 DIAGNOSIS — M6250 Muscle wasting and atrophy, not elsewhere classified, unspecified site: Secondary | ICD-10-CM | POA: Insufficient documentation

## 2013-08-13 DIAGNOSIS — Z7901 Long term (current) use of anticoagulants: Secondary | ICD-10-CM | POA: Diagnosis not present

## 2013-08-13 DIAGNOSIS — R638 Other symptoms and signs concerning food and fluid intake: Secondary | ICD-10-CM | POA: Diagnosis not present

## 2013-08-13 DIAGNOSIS — Z9889 Other specified postprocedural states: Secondary | ICD-10-CM | POA: Insufficient documentation

## 2013-08-13 DIAGNOSIS — R531 Weakness: Secondary | ICD-10-CM

## 2013-08-13 LAB — URINALYSIS, ROUTINE W REFLEX MICROSCOPIC
Bilirubin Urine: NEGATIVE
Glucose, UA: NEGATIVE mg/dL
Hgb urine dipstick: NEGATIVE
Ketones, ur: NEGATIVE mg/dL
Nitrite: NEGATIVE
Protein, ur: 30 mg/dL — AB
Specific Gravity, Urine: 1.01 (ref 1.005–1.030)
Urobilinogen, UA: 1 mg/dL (ref 0.0–1.0)
pH: 7 (ref 5.0–8.0)

## 2013-08-13 LAB — CBC WITH DIFFERENTIAL/PLATELET
Basophils Absolute: 0 10*3/uL (ref 0.0–0.1)
Basophils Relative: 0 % (ref 0–1)
Eosinophils Absolute: 0.2 10*3/uL (ref 0.0–0.7)
Eosinophils Relative: 2 % (ref 0–5)
HCT: 42.9 % (ref 36.0–46.0)
Hemoglobin: 14.9 g/dL (ref 12.0–15.0)
Lymphocytes Relative: 23 % (ref 12–46)
Lymphs Abs: 1.7 10*3/uL (ref 0.7–4.0)
MCH: 32.7 pg (ref 26.0–34.0)
MCHC: 34.7 g/dL (ref 30.0–36.0)
MCV: 94.3 fL (ref 78.0–100.0)
Monocytes Absolute: 0.8 10*3/uL (ref 0.1–1.0)
Monocytes Relative: 10 % (ref 3–12)
Neutro Abs: 4.7 10*3/uL (ref 1.7–7.7)
Neutrophils Relative %: 65 % (ref 43–77)
Platelets: 252 10*3/uL (ref 150–400)
RBC: 4.55 MIL/uL (ref 3.87–5.11)
RDW: 12.7 % (ref 11.5–15.5)
WBC: 7.3 10*3/uL (ref 4.0–10.5)

## 2013-08-13 LAB — BASIC METABOLIC PANEL
BUN: 13 mg/dL (ref 6–23)
CO2: 27 mEq/L (ref 19–32)
Calcium: 9.3 mg/dL (ref 8.4–10.5)
Chloride: 99 mEq/L (ref 96–112)
Creatinine, Ser: 0.85 mg/dL (ref 0.50–1.10)
GFR calc Af Amer: 72 mL/min — ABNORMAL LOW (ref >90)
GFR calc non Af Amer: 62 mL/min — ABNORMAL LOW (ref >90)
Glucose, Bld: 107 mg/dL — ABNORMAL HIGH (ref 70–99)
Potassium: 3.6 mEq/L — ABNORMAL LOW (ref 3.7–5.3)
Sodium: 138 mEq/L (ref 137–147)

## 2013-08-13 LAB — URINE MICROSCOPIC-ADD ON

## 2013-08-13 LAB — TROPONIN I: Troponin I: <0.3 ng/mL (ref <0.30)

## 2013-08-13 MED ORDER — SODIUM CHLORIDE 0.9 % IV BOLUS (SEPSIS)
1000.0000 mL | Freq: Once | INTRAVENOUS | Status: DC
Start: 2013-08-13 — End: 2013-08-13

## 2013-08-13 MED ORDER — SODIUM CHLORIDE 0.9 % IV BOLUS (SEPSIS)
500.0000 mL | Freq: Once | INTRAVENOUS | Status: AC
Start: 2013-08-13 — End: 2013-08-13
  Administered 2013-08-13: 500 mL via INTRAVENOUS

## 2013-08-13 MED ORDER — ONDANSETRON HCL 4 MG PO TABS: 4.0000 mg | ORAL_TABLET | Freq: Four times a day (QID) | ORAL | Status: DC

## 2013-08-13 NOTE — ED Notes (Signed)
Pt ambulatory with one assist.  Had to hold on to nurse to walk.  Unsteady gait.

## 2013-08-13 NOTE — Discharge Instructions (Signed)

## 2013-08-13 NOTE — ED Provider Notes (Signed)
CSN: [id number]     Arrival date & time 08/13/13  1253 History  This chart was scribed for Virgel Manifold, MD by Vernell Barrier, ED scribe. This patient was seen in room APA10/APA10 and the patient's care was started at 1:41 PM.    Chief Complaint  Patient presents with  . Weakness    The history is provided by the patient. No language interpreter was used.   HPI Comments: [name] is a 80 y.o. female w/ hx of breast cancer, CHF, and several surgeries presents to the Emergency Department complaining of generalized weakness and nausea.; onset a couple of days ago. Reports HA and SOB also. Abdominal pain with associated distention. Subjective fever that has since resolved. States she cannot eat. Reports intermittent episodes of diarrhea for the past 3-4 days. Says her legs have been draining due to cellulitis. No sick contacts to report. Went to see her PCP today but he was not working. Has an appointment with [facility] July 7th. Appointment with bone specialist in [facility] July 14th. Denies hematochezia  Past Medical History  Diagnosis Date  . Rheumatic heart disease   . IBS (irritable bowel syndrome)   . GERD (gastroesophageal reflux disease)   . Breast cancer   . Mitral regurgitation   . Mitral stenosis   . Atrial fibrillation   . PAT (paroxysmal atrial tachycardia)   . Hypothyroidism   . Melanoma of neck   . Microscopic colitis 2003    Was maintained on Asacol BID  . S/P colonoscopy April 2010    Friable anal canal, left-sided diverticula, adenomatous polyp  . S/P endoscopy April 2010    Benign polyp, s/p 54-F Maloney dilation  . Hiatal hernia   . Diverticula of colon   . Tubulovillous adenoma   . Adenomatous polyp   . Sleep apnea     CPAP at night  . History of cardiac catheterization     Normal coronaries in 2006   Past Surgical History  Procedure Laterality Date  . Abdominal hysterectomy    . Melanoma removal      SURGERY NECK  . Cataracts    . Hemorrhoid  surgery    . Tonsillectomy    . Colonoscopy  06/12/2008    Dr. Laurine Blazer anal canal, left-sided diverticula, adenomatous polyp  . Esophagogastroduodenoscopy  06/12/2008    Dr. Marice Potter polyp, s/p 54-F Venia Minks dilation  . Colonoscopy  09/28/2011    Dr. Gala Romney- tubular adenoma, colonic diverticulosis  . Cholecystectomy    . Mastectomy      RIGHT BREAST, followed by left breast after radiation  . Esophagogastroduodenoscopy (egd) with esophageal dilation N/A 02/28/2013    Dr. Rourk:Mild changes of reflux esophagitis. Status post passage of a Maloney dilator. Hiatal hernia. Gastric erosions and gastric polyps-, benign fundic gland polyp   Family History  Problem Relation Age of Onset  . Stroke Mother   . Heart attack Father   . Breast cancer Sister   . Colon cancer Neg Hx    History  Substance Use Topics  . Smoking status: Never Smoker   . Smokeless tobacco: Never Used  . Alcohol Use: No   OB History   Grav Para Term Preterm Abortions TAB SAB Ect Mult Living                 Review of Systems  Constitutional: Positive for appetite change. Negative for fever.  Cardiovascular: Positive for leg swelling.  Gastrointestinal: Positive for nausea and abdominal distention. Negative for blood  in stool.  All other systems reviewed and are negative.   Allergies  Amitiza; Ciprofloxacin; Clindamycin/lincomycin; Doxycycline; Linzess; and Sulfonamide derivatives  Home Medications   Prior to Admission medications   Medication Sig Start Date End Date Taking? Authorizing Temperence Zenor  ALPRAZolam Duanne Moron) 0.5 MG tablet Take 0.5 mg by mouth at bedtime as needed. Sleep 06/22/11   Historical Temeka Pore, MD  diltiazem (CARDIZEM CD) 300 MG 24 hr capsule Take 1 capsule (300 mg total) by mouth daily. 02/06/13   Satira Sark, MD  esomeprazole (NEXIUM) 40 MG capsule Take 1 capsule (40 mg total) by mouth daily before breakfast. 05/21/13   Orvil Feil, NP  furosemide (LASIX) 20 MG tablet Take 20 mg by mouth  as needed.    Historical Kolson Chovanec, MD  HYDROcodone-acetaminophen (LORTAB) 10-500 MG per tablet Take 2 tablets by mouth every 6 (six) hours as needed. Pain    Historical Niquan Charnley, MD  hydrocortisone (CORTEF) 10 MG tablet Take 10 mg by mouth every other day.     Historical Shiron Whetsel, MD  hydrocortisone cream 1 % Apply 1 application topically daily as needed for itching. 03/01/12   Nimish Luther Parody, MD  levothyroxine (SYNTHROID, LEVOTHROID) 125 MCG tablet Take 125 mcg by mouth daily.    Historical Keyshia Orwick, MD  mirabegron ER (MYRBETRIQ) 25 MG TB24 tablet Take 25 mg by mouth daily.    Historical Letia Guidry, MD  pantoprazole (PROTONIX) 40 MG tablet Take 40 mg by mouth 2 (two) times daily before a meal.    Historical Bodin Gorka, MD  Probiotic Product (ALIGN) 4 MG CAPS Take 4 mg by mouth daily.    Historical Elpidia Karn, MD  propranolol (INDERAL) 80 MG tablet Take 80 mg by mouth daily.      Historical Rynlee Lisbon, MD  spironolactone (ALDACTONE) 25 MG tablet Take 0.5 tablets (12.5 mg total) by mouth daily. 12/30/12   Satira Sark, MD  warfarin (COUMADIN) 2.5 MG tablet Take 1/2 tablet daily except 1 tablet on Mondays or as directed 08/11/13   Satira Sark, MD   Triage vitals: BP 143/72  Pulse 70  Temp(Src) 97.9 F (36.6 C) (Oral)  Resp 18  Ht 5\' 1"  (1.549 m)  Wt 132 lb (59.875 kg)  BMI 24.95 kg/m2  SpO2 98%  Physical Exam  Nursing note and vitals reviewed. Constitutional: She is oriented to person, place, and time. She appears well-developed and well-nourished.  Tired appearing.  HENT:  Head: Normocephalic.  Eyes: EOM are normal.  Neck: Normal range of motion.  Cardiovascular: Normal rate and normal heart sounds.  An irregularly irregular rhythm present.  No murmur heard. Pulmonary/Chest: Effort normal and breath sounds normal. No respiratory distress. She has no wheezes. She has no rales.  Abdominal: Soft. She exhibits no distension. There is no tenderness. There is no rebound and no guarding.   Mild diffuse tenderness.  Musculoskeletal: Normal range of motion.  Neurological: She is alert and oriented to person, place, and time. No cranial nerve deficit. She exhibits normal muscle tone. Coordination normal.  Speech clear. Content appropriate. Strength 5/5 bl u/l extrem. Good finger to nose b/l.   Psychiatric: She has a normal mood and affect.    ED Course  Procedures (including critical care time) DIAGNOSTIC STUDIES: Oxygen Saturation is 98% on room air, normal by my interpretation.    COORDINATION OF CARE: At 1:47 PM: Discussed treatment plan with patient which includes blood work, UA, and IV fluids. Patient agrees.    Labs Review Labs Reviewed  URINALYSIS, ROUTINE  W REFLEX MICROSCOPIC - Abnormal; Notable for the following:    Protein, ur 30 (*)    Leukocytes, UA TRACE (*)    All other components within normal limits  BASIC METABOLIC PANEL - Abnormal; Notable for the following:    Potassium 3.6 (*)    Glucose, Bld 107 (*)    GFR calc non Af Amer 62 (*)    GFR calc Af Amer 72 (*)    All other components within normal limits  URINE MICROSCOPIC-ADD ON - Abnormal; Notable for the following:    Squamous Epithelial / LPF MANY (*)    All other components within normal limits  CBC WITH DIFFERENTIAL  TROPONIN I    Imaging Review Dg Chest 2 View  08/13/2013   CLINICAL DATA:  Weakness, past history breast cancer, melanoma, rheumatic heart disease, GERD  EXAM: CHEST  2 VIEW  COMPARISON:  02/29/2012  FINDINGS: Enlargement of cardiac silhouette.  Mediastinal contours and pulmonary vascularity normal.  Lungs appear emphysematous but clear.  Minimal central peribronchial thickening.  No infiltrate, pleural effusion or pneumothorax.  Surgical clips RIGHT axilla.  No acute osseous findings.  IMPRESSION: Emphysematous and bronchitic changes question COPD.  No acute abnormalities.   Electronically Signed   By: Lavonia Dana M.D.   On: 08/13/2013 14:52     EKG  Interpretation   Date/Time:  Wednesday August 13 2013 13:09:34 EDT Ventricular Rate:  66 PR Interval:    QRS Duration: 69 QT Interval:  415 QTC Calculation: 435 R Axis:   4 Text Interpretation:  Atrial fibrillation Ventricular premature complex  Low voltage, precordial leads Confirmed by KOHUT  MD, STEPHEN ([id number]) on  08/13/2013 5:24:50 PM      MDM   Final diagnoses:  Generalized weakness  Physical deconditioning  Nausea   84yF with numerous complaints, but essentially generalized weakness/deconditioning. W/u fairly unremarkable. Afib, but not new onset. Afebrile. HD stable. Does not seem altered and no focal deficits. Pt lives at home alone. Has a 12 year old cousin in [place]. No family otherwise. I have some safety concerns for her at home in terms of fall risk and being able to complete ADLs. She did require some mild assistance ambulating. She is not interested in alternative such as rehab, assisted living, etc.at this time. Encouraged her to give it more thought.    I personally performed the services described in this documentation, which was scribed in my presence. The recorded information has been reviewed and is accurate.     Virgel Manifold, MD 08/13/13 1726

## 2013-08-13 NOTE — ED Notes (Addendum)
Caretaker said pt was not as alert as usual this morning.  Per pt she just   "feels out of sorts"  Takes vicodin for pain .  States she ran out too early because she overtook them.  Last time she had the vicodin was Saturday.

## 2013-08-21 ENCOUNTER — Encounter: Payer: Medicare Other | Admitting: Vascular Surgery

## 2013-08-21 ENCOUNTER — Encounter (HOSPITAL_COMMUNITY): Payer: Medicare Other

## 2013-08-26 ENCOUNTER — Ambulatory Visit (INDEPENDENT_AMBULATORY_CARE_PROVIDER_SITE_OTHER): Payer: Medicare Other | Admitting: *Deleted

## 2013-08-26 ENCOUNTER — Encounter: Payer: Self-pay | Admitting: Cardiology

## 2013-08-26 ENCOUNTER — Ambulatory Visit (INDEPENDENT_AMBULATORY_CARE_PROVIDER_SITE_OTHER): Payer: Medicare Other | Admitting: Cardiology

## 2013-08-26 VITALS — BP 122/62 | HR 76 | Ht 61.0 in | Wt 130.0 lb

## 2013-08-26 DIAGNOSIS — I4891 Unspecified atrial fibrillation: Secondary | ICD-10-CM

## 2013-08-26 DIAGNOSIS — R609 Edema, unspecified: Secondary | ICD-10-CM

## 2013-08-26 DIAGNOSIS — I482 Chronic atrial fibrillation, unspecified: Secondary | ICD-10-CM

## 2013-08-26 DIAGNOSIS — Z5181 Encounter for therapeutic drug level monitoring: Secondary | ICD-10-CM

## 2013-08-26 DIAGNOSIS — I059 Rheumatic mitral valve disease, unspecified: Secondary | ICD-10-CM

## 2013-08-26 DIAGNOSIS — I359 Nonrheumatic aortic valve disorder, unspecified: Secondary | ICD-10-CM

## 2013-08-26 DIAGNOSIS — R600 Localized edema: Secondary | ICD-10-CM

## 2013-08-26 LAB — POCT INR: INR: 3

## 2013-08-26 MED ORDER — SPIRONOLACTONE 25 MG PO TABS
25.0000 mg | ORAL_TABLET | Freq: Every day | ORAL | Status: DC
Start: 2013-08-26 — End: 2014-08-27

## 2013-08-26 NOTE — Assessment & Plan Note (Signed)
Also associated with venous varicosities. Likely multifactorial with atrial fibrillation and  valvular heart disease. Increase Aldactone to 25 mg daily. She tells that she is scheduled to see Dr. Kellie Simmering soon for additional workup.

## 2013-08-26 NOTE — Progress Notes (Signed)
Clinical Summary [name] is an 80 y.o.female last seen in March of this year. She was seen in the ER in June related to generalized weakness. She tells me that she has intermittent sense of palpitations, nothing progressive. She reports compliance with her medications including low-dose Aldactone 12.5 mg daily. Still having trouble with leg edema and prominent venous varicosities. She tells me that she has had some weeping from her veins at times and is actually scheduled to see Dr. Kellie Simmering soon.  Lab work in June showed hemoglobin 14.9, platelets 252, potassium 3.6, BUN 13, creatinine 0.8, troponin I less than 0.3. ECG showed rate controlled atrial fibrillation with nonspecific ST changes and PVC.  Echocardiogram from May 2013 demonstrated mild basal septal hypertrophy with LVEF 55-60%, mild to moderate aortic regurgitation, restricted posterior mitral leaflet motion with mild stenosis and mild regurgitation, moderately dilated left atrium and right atrium.   Allergies  Allergen Reactions  . Amitiza [Lubiprostone] Swelling  . Ciprofloxacin Nausea And Vomiting  . Clindamycin/Lincomycin     Nauseated/headache  . Doxycycline     Nauseated/headache  . Erwinaze [Asparaginase Erwinia]     Told not to take by dr  . Rolan Lipa [Linaclotide] Swelling and Rash  . Sulfonamide Derivatives Nausea And Vomiting and Rash    Current Outpatient Prescriptions  Medication Sig Dispense Refill  . ALPRAZolam (XANAX) 0.5 MG tablet Take 0.5 mg by mouth at bedtime as needed. Sleep      . amoxicillin (AMOXIL) 250 MG capsule Take 250 mg by mouth every 3 (three) days.      Marland Kitchen diltiazem (CARDIZEM CD) 300 MG 24 hr capsule Take 1 capsule (300 mg total) by mouth daily.  90 capsule  3  . esomeprazole (NEXIUM) 40 MG capsule Take 1 capsule (40 mg total) by mouth daily before breakfast.  30 capsule  11  . HYDROcodone-acetaminophen (NORCO) 10-325 MG per tablet Take 1 tablet by mouth every 6 (six) hours as needed.      .  hydrocortisone (CORTEF) 10 MG tablet Take 10 mg by mouth daily.       . hydrocortisone cream 1 % Apply 1 application topically daily as needed for itching.      . levothyroxine (SYNTHROID, LEVOTHROID) 125 MCG tablet Take 125 mcg by mouth daily.      . mirabegron ER (MYRBETRIQ) 25 MG TB24 tablet Take 25 mg by mouth daily.      Marland Kitchen PREMARIN vaginal cream Place 1 Applicatorful vaginally as needed (burning).       . Probiotic Product (ALIGN PO) Take 1 tablet by mouth daily.      . propranolol ER (INDERAL LA) 80 MG 24 hr capsule Take 80 mg by mouth daily.      . traMADol (ULTRAM) 50 MG tablet Take 50 mg by mouth as needed for moderate pain.      Marland Kitchen warfarin (COUMADIN) 2.5 MG tablet Take 1/2 tablet daily except 1 tablet on Mondays or as directed  30 tablet  3  . spironolactone (ALDACTONE) 25 MG tablet Take 1 tablet (25 mg total) by mouth daily.  90 tablet  3   No current facility-administered medications for this visit.    Past Medical History  Diagnosis Date  . Rheumatic heart disease   . IBS (irritable bowel syndrome)   . GERD (gastroesophageal reflux disease)   . Breast cancer   . Mitral regurgitation   . Mitral stenosis   . Atrial fibrillation   . PAT (paroxysmal atrial tachycardia)   .  Hypothyroidism   . Melanoma of neck   . Microscopic colitis 2003    Was maintained on Asacol BID  . S/P colonoscopy April 2010    Friable anal canal, left-sided diverticula, adenomatous polyp  . S/P endoscopy April 2010    Benign polyp, s/p 54-F Maloney dilation  . Hiatal hernia   . Diverticula of colon   . Tubulovillous adenoma   . Adenomatous polyp   . Sleep apnea     CPAP at night  . History of cardiac catheterization     Normal coronaries in 2006    Social History [name] reports that she has never smoked. She has never used smokeless tobacco. [name] reports that she does not drink alcohol.  Review of Systems Appetite is fair. She states she has someone assisting her at home 3 days  a week. No recent falls. No bleeding problems. Other systems reviewed and negative except as outlined.  Physical Examination Filed Vitals:   08/26/13 1020  BP: 122/62  Pulse: 76   Filed Weights   08/26/13 1020  Weight: 130 lb (58.968 kg)    Elderly woman, appears comfortable at rest.  HEENT: Conjunctiva and lids are normal, oropharynx clear.  Neck: Supple, no elevated jugular venous pressure, no carotid bruits. No thyromegaly.  Lungs: Diminished breath sounds, no rales, nonlabored.  Cardiac: Irregularly irregular, 2/6 apical systolic murmur, no S3 gallop, no pericardial rub.  Abdomen: Soft, protuberant, bowel sounds present.  Extremities: Erythema with bilateral venous varicosities and trace pitting edema, distal pulses one plus.  Neuropsychiatric: Alert oriented x3, affect appropriate.  Skin: Warm and dry.  Musculoskeletal: Kyphosis noted.   Problem List and Plan   Leg edema Also associated with venous varicosities. Likely multifactorial with atrial fibrillation and  valvular heart disease. Increase Aldactone to 25 mg daily. She tells that she is scheduled to see Dr. Kellie Simmering soon for additional workup.  Atrial fibrillation Continue strategy of heart rate control and anticoagulation.  Aortic valve disease Mild to moderate aortic regurgitation as of echocardiogram in May 2013. Followup study will be obtained.  Mitral valve disease Mild mitral stenosis and regurgitation with moderately dilated left atrium as of echocardiogram in May 2013. Followup study to be obtained.    Satira Sark, M.D., F.A.C.C.

## 2013-08-26 NOTE — Patient Instructions (Signed)
Your physician recommends that you schedule a follow-up appointment in:3 months with Dr.McDowell    Your physician has recommended you make the following change in your medication:     INCREASE Aldactone to 25 mg daily     Your physician has requested that you have an echocardiogram. Echocardiography is a painless test that uses sound waves to create images of your heart. It provides your doctor with information about the size and shape of your heart and how well your heart's chambers and valves are working. This procedure takes approximately one hour. There are no restrictions for this procedure.      Thank you for choosing [facility] !

## 2013-08-26 NOTE — Assessment & Plan Note (Signed)
Mild to moderate aortic regurgitation as of echocardiogram in May 2013. Followup study will be obtained.

## 2013-08-26 NOTE — Assessment & Plan Note (Signed)
Continue strategy of heart rate control and anticoagulation. 

## 2013-08-26 NOTE — Assessment & Plan Note (Signed)
Mild mitral stenosis and regurgitation with moderately dilated left atrium as of echocardiogram in May 2013. Followup study to be obtained.

## 2013-09-01 ENCOUNTER — Encounter: Payer: Self-pay | Admitting: Vascular Surgery

## 2013-09-02 ENCOUNTER — Encounter: Payer: Self-pay | Admitting: Vascular Surgery

## 2013-09-02 ENCOUNTER — Ambulatory Visit (INDEPENDENT_AMBULATORY_CARE_PROVIDER_SITE_OTHER): Payer: Self-pay | Admitting: Vascular Surgery

## 2013-09-02 ENCOUNTER — Ambulatory Visit (HOSPITAL_COMMUNITY)
Admission: RE | Admit: 2013-09-02 | Discharge: 2013-09-02 | Disposition: A | Discharge status: Home / Self Care | Payer: Medicare Other | Source: Ambulatory Visit | Attending: Vascular Surgery | Admitting: Vascular Surgery

## 2013-09-02 VITALS — BP 130/58 | HR 72 | Temp 97.6°F | Resp 16 | Ht 61.0 in | Wt 130.0 lb

## 2013-09-02 DIAGNOSIS — R609 Edema, unspecified: Secondary | ICD-10-CM | POA: Diagnosis present

## 2013-09-02 DIAGNOSIS — R600 Localized edema: Secondary | ICD-10-CM

## 2013-09-02 DIAGNOSIS — I872 Venous insufficiency (chronic) (peripheral): Secondary | ICD-10-CM | POA: Insufficient documentation

## 2013-09-02 DIAGNOSIS — I83893 Varicose veins of bilateral lower extremities with other complications: Secondary | ICD-10-CM

## 2013-09-02 NOTE — Progress Notes (Signed)
Subjective:     Patient ID: [name], female   DOB: May 29, 1929, 80 y.o.   MRN: [id number]  HPI this 80 year old female was referred by Dr. Gerarda Fraction for violation of bilateral edema and varicose veins in the right leg. Patient has a history of congestive heart failure and is on chronic Coumadin therapy. She has no history of DVT or thrombophlebitis. She states that both legs well right worse than left she does have aching discomfort. She has had episodes of cellulitis in the past. She denies any recent chills and fever. She does not elevate her legs nor were lasted compression stockings.  Past Medical History  Diagnosis Date  . Rheumatic heart disease   . IBS (irritable bowel syndrome)   . GERD (gastroesophageal reflux disease)   . Breast cancer   . Mitral regurgitation   . Mitral stenosis   . Atrial fibrillation   . PAT (paroxysmal atrial tachycardia)   . Hypothyroidism   . Melanoma of neck   . Microscopic colitis 2003    Was maintained on Asacol BID  . S/P colonoscopy April 2010    Friable anal canal, left-sided diverticula, adenomatous polyp  . S/P endoscopy April 2010    Benign polyp, s/p 54-F Maloney dilation  . Hiatal hernia   . Diverticula of colon   . Tubulovillous adenoma   . Adenomatous polyp   . Sleep apnea     CPAP at night  . History of cardiac catheterization     Normal coronaries in 2006    History  Substance Use Topics  . Smoking status: Never Smoker   . Smokeless tobacco: Never Used  . Alcohol Use: No    Family History  Problem Relation Age of Onset  . Stroke Mother   . Heart attack Father   . Breast cancer Sister   . Colon cancer Neg Hx     Allergies  Allergen Reactions  . Amitiza [Lubiprostone] Swelling  . Ciprofloxacin Nausea And Vomiting  . Clindamycin/Lincomycin     Nauseated/headache  . Doxycycline     Nauseated/headache  . Erwinaze [Asparaginase Erwinia]     Told not to take by dr  . Rolan Lipa [Linaclotide] Swelling and Rash  .  Sulfonamide Derivatives Nausea And Vomiting and Rash    Current outpatient prescriptions:ALPRAZolam (XANAX) 0.5 MG tablet, Take 0.5 mg by mouth at bedtime as needed. Sleep, Disp: , Rfl: ;  amoxicillin (AMOXIL) 250 MG capsule, Take 250 mg by mouth every 3 (three) days., Disp: , Rfl: ;  diltiazem (CARDIZEM CD) 300 MG 24 hr capsule, Take 1 capsule (300 mg total) by mouth daily., Disp: 90 capsule, Rfl: 3 esomeprazole (NEXIUM) 40 MG capsule, Take 1 capsule (40 mg total) by mouth daily before breakfast., Disp: 30 capsule, Rfl: 11;  HYDROcodone-acetaminophen (NORCO) 10-325 MG per tablet, Take 1 tablet by mouth every 6 (six) hours as needed., Disp: , Rfl: ;  hydrocortisone (CORTEF) 10 MG tablet, Take 10 mg by mouth daily. , Disp: , Rfl: ;  hydrocortisone cream 1 %, Apply 1 application topically daily as needed for itching., Disp: , Rfl:  levothyroxine (SYNTHROID, LEVOTHROID) 125 MCG tablet, Take 125 mcg by mouth daily., Disp: , Rfl: ;  mirabegron ER (MYRBETRIQ) 25 MG TB24 tablet, Take 25 mg by mouth daily., Disp: , Rfl: ;  PREMARIN vaginal cream, Place 1 Applicatorful vaginally as needed (burning). , Disp: , Rfl: ;  Probiotic Product (ALIGN PO), Take 1 tablet by mouth daily., Disp: , Rfl: ;  propranolol ER (  INDERAL LA) 80 MG 24 hr capsule, Take 80 mg by mouth daily., Disp: , Rfl:  spironolactone (ALDACTONE) 25 MG tablet, Take 1 tablet (25 mg total) by mouth daily., Disp: 90 tablet, Rfl: 3;  traMADol (ULTRAM) 50 MG tablet, Take 50 mg by mouth as needed for moderate pain., Disp: , Rfl: ;  warfarin (COUMADIN) 2.5 MG tablet, Take 1/2 tablet daily except 1 tablet on Mondays or as directed, Disp: 30 tablet, Rfl: 3  BP 130/58  Pulse 72  Temp(Src) 97.6 F (36.4 C) (Oral)  Resp 16  Ht 5\' 1"  (1.549 m)  Wt 130 lb (58.968 kg)  BMI 24.58 kg/m2  SpO2 98%  Body mass index is 24.58 kg/(m^2).           Review of Systems the patient has multiple complaints including chest pain, dyspnea on exertion, orthopnea,  history of blood clots, swelling in legs, varicose veins, weakness in arms and legs, numbness in arms legs, dizziness, skin rashes, ulcers, history of chills and fever. Other systems unremarkable.    Objective:   Physical Exam BP 130/58  Pulse 72  Temp(Src) 97.6 F (36.4 C) (Oral)  Resp 16  Ht 5\' 1"  (1.549 m)  Wt 130 lb (58.968 kg)  BMI 24.58 kg/m2  SpO2 98%  Gen.-alert and oriented x3 in no apparent distress-frail in appearance HEENT normal for age Lungs no rhonchi or wheezing Cardiovascular regular rhythm no murmurs carotid pulses 3+ palpable no bruits audible Abdomen soft nontender no palpable masses Musculoskeletal free of  major deformities Skin clear -no rashes Neurologic normal Lower extremities 3+ femoral and dorsalis pedis pulses palpable bilaterally with 1+ edema bilaterally Bulging varicosities right medial calf over great saphenous system with no hyperpigmentation or ulceration noted. Left leg free a varicosities with minimal distal edema  Today I ordered bilateral venous duplex exam which I reviewed and interpreted. The right leg does have reflux in the great saphenous vein but it is a small caliber vein. There is also reflux and a small saphenous vein which is small caliber as well. There is deep venous reflux bilaterally. The ] left leg has reflux and a small saphenous vein which is a small caliber vein and there is significant reflux in the deep system. There is no DVT bilaterally.        Assessment:     Bilateral lower extremity edema and bulging varicosities right calf due to deep venous reflux bilaterally and right great saphenous reflux in small caliber vein    Plan:     #10 Lithobid 3-4 inches #2 shortly elastic compression stockings 20-30 mm gradient #3 would not recommend any intervention on this lady for her varicosities in the right calf but would rather treat her medically No further vascular evaluation indicated. She has good arterial circulation and  deep venous insufficiency bilaterally Return to see me on when necessary basis

## 2013-09-05 ENCOUNTER — Ambulatory Visit (HOSPITAL_COMMUNITY)
Admission: RE | Admit: 2013-09-05 | Discharge: 2013-09-05 | Disposition: A | Discharge status: Home / Self Care | Payer: Medicare Other | Source: Ambulatory Visit | Attending: Cardiology | Admitting: Cardiology

## 2013-09-05 DIAGNOSIS — I080 Rheumatic disorders of both mitral and aortic valves: Secondary | ICD-10-CM | POA: Insufficient documentation

## 2013-09-05 DIAGNOSIS — I4891 Unspecified atrial fibrillation: Secondary | ICD-10-CM | POA: Insufficient documentation

## 2013-09-05 DIAGNOSIS — I359 Nonrheumatic aortic valve disorder, unspecified: Secondary | ICD-10-CM

## 2013-09-05 DIAGNOSIS — I059 Rheumatic mitral valve disease, unspecified: Secondary | ICD-10-CM

## 2013-09-05 DIAGNOSIS — I482 Chronic atrial fibrillation, unspecified: Secondary | ICD-10-CM

## 2013-09-05 DIAGNOSIS — Z7901 Long term (current) use of anticoagulants: Secondary | ICD-10-CM | POA: Insufficient documentation

## 2013-09-05 NOTE — Progress Notes (Signed)
  Echocardiogram 2D Echocardiogram has been performed.  [place], [place] 09/05/2013, 11:22 AM

## 2013-09-29 ENCOUNTER — Encounter: Payer: Self-pay | Admitting: Internal Medicine

## 2013-10-08 ENCOUNTER — Ambulatory Visit (INDEPENDENT_AMBULATORY_CARE_PROVIDER_SITE_OTHER): Payer: Medicare Other | Admitting: *Deleted

## 2013-10-08 DIAGNOSIS — Z5181 Encounter for therapeutic drug level monitoring: Secondary | ICD-10-CM

## 2013-10-08 DIAGNOSIS — I4891 Unspecified atrial fibrillation: Secondary | ICD-10-CM

## 2013-10-08 LAB — POCT INR: INR: 1.7

## 2013-10-30 ENCOUNTER — Ambulatory Visit (INDEPENDENT_AMBULATORY_CARE_PROVIDER_SITE_OTHER): Payer: Medicare Other | Admitting: *Deleted

## 2013-10-30 DIAGNOSIS — Z5181 Encounter for therapeutic drug level monitoring: Secondary | ICD-10-CM

## 2013-10-30 DIAGNOSIS — I4891 Unspecified atrial fibrillation: Secondary | ICD-10-CM

## 2013-10-30 LAB — POCT INR: INR: 1.6

## 2013-11-05 ENCOUNTER — Encounter: Payer: Self-pay | Admitting: Gastroenterology

## 2013-11-05 ENCOUNTER — Ambulatory Visit (INDEPENDENT_AMBULATORY_CARE_PROVIDER_SITE_OTHER): Payer: Medicare Other | Admitting: Gastroenterology

## 2013-11-05 VITALS — BP 119/67 | HR 59 | Temp 97.0°F | Ht 61.0 in | Wt 131.6 lb

## 2013-11-05 DIAGNOSIS — R109 Unspecified abdominal pain: Secondary | ICD-10-CM

## 2013-11-05 MED ORDER — ONDANSETRON HCL 4 MG PO TABS: 4.0000 mg | ORAL_TABLET | Freq: Three times a day (TID) | ORAL | Status: DC | PRN

## 2013-11-05 NOTE — Assessment & Plan Note (Signed)
Chronic. Likely secondary to known underlying constipation. CTA on file negative for mesenteric ischemia. Question adhesive disease as a contributor, as majority of pain located at lower midline incision. Reports black stool; unable to evaluate for melena on rectal exam as no stool present in rectal vault. Stat CBC now, ifobt. If any evidence of heme positive and/or anemia, will likely need EGD with Dr. Gala Romney. Last in Jan 2015 as mentioned in HPI. Further recommendations to follow.  In interim, continue colace, probiotic, Nexium daily for GERD, add IBgard.

## 2013-11-05 NOTE — Patient Instructions (Addendum)
You may take Zofran as needed for nausea.   Please complete the blood work today. Complete the stool test and return to our office as soon as possible! We may need to do an upper endoscopy if there is blood in your stool.

## 2013-11-05 NOTE — Progress Notes (Signed)
Referring Provider: Redmond School, MD Primary Care Physician:  [name], MD Primary GI: Dr. Gala Romney   Chief Complaint  Patient presents with  . Abdominal Pain    HPI:   Presents in follow-up with history of abdominal pain, constipation, nausea, dysphagia. EGD Jan 2015 with esophagitis, empiric dilation. Unable to take Linzess or Amitiza. Miralax burns "going down". CTA without evidence of significant occlusive disease.   Complains of abdominal bloating, swelling. Multiple non-GI complaints today. Bowel movements "pretty good". Taking stool softeners. Taken Senna laxative twice since last seen in May 2015. Nexium daily. Down 10 lbs since last seen. Stays nauseated. Ensure and yogurt most of the time. States stool is black. Not on iron.    Past Medical History  Diagnosis Date  . Rheumatic heart disease   . IBS (irritable bowel syndrome)   . GERD (gastroesophageal reflux disease)   . Breast cancer   . Mitral regurgitation   . Mitral stenosis   . Atrial fibrillation   . PAT (paroxysmal atrial tachycardia)   . Hypothyroidism   . Melanoma of neck   . Microscopic colitis 2003    Was maintained on Asacol BID  . S/P colonoscopy April 2010    Friable anal canal, left-sided diverticula, adenomatous polyp  . S/P endoscopy April 2010    Benign polyp, s/p 54-F Maloney dilation  . Hiatal hernia   . Diverticula of colon   . Tubulovillous adenoma   . Adenomatous polyp   . Sleep apnea     CPAP at night  . History of cardiac catheterization     Normal coronaries in 2006    Past Surgical History  Procedure Laterality Date  . Abdominal hysterectomy    . Melanoma removal      SURGERY NECK  . Cataracts    . Hemorrhoid surgery    . Tonsillectomy    . Colonoscopy  06/12/2008    Dr. Laurine Blazer anal canal, left-sided diverticula, adenomatous polyp  . Esophagogastroduodenoscopy  06/12/2008    Dr. Marice Potter polyp, s/p 54-F Venia Minks dilation  . Colonoscopy  09/28/2011    Dr.  Gala Romney- tubular adenoma, colonic diverticulosis  . Cholecystectomy    . Mastectomy      RIGHT BREAST, followed by left breast after radiation  . Esophagogastroduodenoscopy (egd) with esophageal dilation N/A 02/28/2013    Dr. Rourk:Mild changes of reflux esophagitis. Status post passage of a Maloney dilator. Hiatal hernia. Gastric erosions and gastric polyps-, benign fundic gland polyp    Current Outpatient Prescriptions  Medication Sig Dispense Refill  . ALPRAZolam (XANAX) 0.5 MG tablet Take 0.5 mg by mouth at bedtime as needed. Sleep      . amoxicillin (AMOXIL) 250 MG capsule Take 250 mg by mouth every 3 (three) days.      Marland Kitchen diltiazem (CARDIZEM CD) 300 MG 24 hr capsule Take 1 capsule (300 mg total) by mouth daily.  90 capsule  3  . esomeprazole (NEXIUM) 40 MG capsule Take 1 capsule (40 mg total) by mouth daily before breakfast.  30 capsule  11  . furosemide (LASIX) 20 MG tablet Take 20 mg by mouth 2 (two) times daily.      Marland Kitchen HYDROcodone-acetaminophen (NORCO) 10-325 MG per tablet Take 1 tablet by mouth every 6 (six) hours as needed.      . hydrocortisone (CORTEF) 10 MG tablet Take 10 mg by mouth daily.       . hydrocortisone cream 1 % Apply 1 application topically daily as needed for itching.      Marland Kitchen  levothyroxine (SYNTHROID, LEVOTHROID) 125 MCG tablet Take 125 mcg by mouth daily.      . mirabegron ER (MYRBETRIQ) 25 MG TB24 tablet Take 25 mg by mouth daily.      . Probiotic Product (ALIGN PO) Take 1 tablet by mouth daily.      . propranolol ER (INDERAL LA) 80 MG 24 hr capsule Take 80 mg by mouth daily.      Marland Kitchen spironolactone (ALDACTONE) 25 MG tablet Take 1 tablet (25 mg total) by mouth daily.  90 tablet  3  . traMADol (ULTRAM) 50 MG tablet Take 50 mg by mouth as needed for moderate pain.      Marland Kitchen warfarin (COUMADIN) 2.5 MG tablet Take 1/2 tablet daily except 1 tablet on Mondays or as directed  30 tablet  3  . PREMARIN vaginal cream Place 1 Applicatorful vaginally as needed (burning).        No  current facility-administered medications for this visit.    Allergies as of 11/05/2013 - Review Complete 11/05/2013  Allergen Reaction Noted  . Amitiza [lubiprostone] Swelling 01/10/2012  . Ciprofloxacin Nausea And Vomiting 06/07/2011  . Clindamycin/lincomycin  12/30/2012  . Doxycycline  12/30/2012  . Erwinaze [asparaginase erwinia]  08/13/2013  . Linzess [linaclotide] Swelling and Rash 02/27/2012  . Sulfonamide derivatives Nausea And Vomiting and Rash 10/23/2007    Family History  Problem Relation Age of Onset  . Stroke Mother   . Heart attack Father   . Breast cancer Sister   . Colon cancer Neg Hx     History   Social History  . Marital Status: Single    Spouse Name: N/A    Number of Children: N/A  . Years of Education: N/A   Occupational History  . RETIRED    Social History Main Topics  . Smoking status: Never Smoker   . Smokeless tobacco: Never Used  . Alcohol Use: No  . Drug Use: No  . Sexual Activity: No   Other Topics Concern  . None   Social History Narrative  . None    Review of Systems: As mentioned in HPI  Physical Exam: BP 119/67  Pulse 59  Temp(Src) 97 F (36.1 C) (Oral)  Ht 5\' 1"  (1.549 m)  Wt 131 lb 9.6 oz (59.693 kg)  BMI 24.88 kg/m2 General:   Alert and oriented. Mildly anxious, at baseline. Head:  Normocephalic and atraumatic. Eyes:  Conjuctiva clear without scleral icterus. Mouth:  Oral mucosa pink and moist.  Heart:  S1, S2 present, irregularly irregular Abdomen:  +BS, soft, TTP lower abdomen specifically at side of low midline incision. Non-distended.  Msk: kyphosis Extremities:  1+ lower extremity edema Neurologic:  Alert and  oriented x4;  grossly normal neurologically. Skin:  Intact without significant lesions or rashes.

## 2013-11-11 ENCOUNTER — Ambulatory Visit (INDEPENDENT_AMBULATORY_CARE_PROVIDER_SITE_OTHER): Payer: Medicare Other

## 2013-11-11 ENCOUNTER — Telehealth: Payer: Self-pay | Admitting: Internal Medicine

## 2013-11-11 DIAGNOSIS — R109 Unspecified abdominal pain: Secondary | ICD-10-CM

## 2013-11-11 NOTE — Telephone Encounter (Signed)
Pt is coming by to drop off her ifobt and also was asking if she could get some more samples of IB Guard. She said that [facility] told her it was so new that they don't have it in stock. Please advise if we can give her a few.

## 2013-11-11 NOTE — Progress Notes (Signed)
cc'ed to pcp °

## 2013-11-12 NOTE — Telephone Encounter (Signed)
Pt came by to drop off her ifobt and GF gave her samples of IB Guard

## 2013-11-13 ENCOUNTER — Ambulatory Visit (INDEPENDENT_AMBULATORY_CARE_PROVIDER_SITE_OTHER): Payer: Medicare Other | Admitting: *Deleted

## 2013-11-13 DIAGNOSIS — Z5181 Encounter for therapeutic drug level monitoring: Secondary | ICD-10-CM

## 2013-11-13 DIAGNOSIS — I4891 Unspecified atrial fibrillation: Secondary | ICD-10-CM

## 2013-11-13 LAB — POCT INR: INR: 2.2

## 2013-11-14 LAB — IFOBT (OCCULT BLOOD): IFOBT: POSITIVE

## 2013-11-14 NOTE — Progress Notes (Signed)
Pt returned iFOBT and it was positive.

## 2013-11-20 ENCOUNTER — Telehealth: Payer: Self-pay | Admitting: Cardiology

## 2013-11-20 NOTE — Telephone Encounter (Signed)
Patient called and wants to get her stool card results.

## 2013-11-24 ENCOUNTER — Other Ambulatory Visit: Payer: Self-pay

## 2013-11-24 DIAGNOSIS — K921 Melena: Secondary | ICD-10-CM

## 2013-11-24 NOTE — Progress Notes (Signed)
Quick Note:  Lab order has been faxed to [facility]. ______

## 2013-11-24 NOTE — Progress Notes (Signed)
Quick Note:  Tried to call pt. VM not set up. ______

## 2013-11-24 NOTE — Progress Notes (Signed)
Quick Note:  ifobt positive.  Patient did not get CBC done that was requested.  Needs to have this stat. ______

## 2013-11-24 NOTE — Progress Notes (Signed)
Quick Note:  I called Rema Fendt, Pt's emergency contact and he will try to get in touch with her and have her call us. He lives in [place]. ______

## 2013-11-25 ENCOUNTER — Encounter: Payer: Self-pay | Admitting: Cardiology

## 2013-11-25 ENCOUNTER — Ambulatory Visit (INDEPENDENT_AMBULATORY_CARE_PROVIDER_SITE_OTHER): Payer: Medicare Other | Admitting: Cardiology

## 2013-11-25 VITALS — BP 112/64 | HR 60 | Ht 61.0 in | Wt 127.0 lb

## 2013-11-25 DIAGNOSIS — R600 Localized edema: Secondary | ICD-10-CM

## 2013-11-25 DIAGNOSIS — I482 Chronic atrial fibrillation, unspecified: Secondary | ICD-10-CM

## 2013-11-25 DIAGNOSIS — I359 Nonrheumatic aortic valve disorder, unspecified: Secondary | ICD-10-CM

## 2013-11-25 DIAGNOSIS — I059 Rheumatic mitral valve disease, unspecified: Secondary | ICD-10-CM

## 2013-11-25 LAB — CBC WITH DIFFERENTIAL/PLATELET
Basophils Absolute: 0 10*3/uL (ref 0.0–0.1)
Basophils Relative: 0 % (ref 0–1)
EOS ABS: 0.1 10*3/uL (ref 0.0–0.7)
Eosinophils Relative: 1 % (ref 0–5)
HCT: 43.3 % (ref 36.0–46.0)
HEMOGLOBIN: 15.1 g/dL — AB (ref 12.0–15.0)
LYMPHS ABS: 3 10*3/uL (ref 0.7–4.0)
LYMPHS PCT: 29 % (ref 12–46)
MCH: 32.4 pg (ref 26.0–34.0)
MCHC: 34.9 g/dL (ref 30.0–36.0)
MCV: 92.9 fL (ref 78.0–100.0)
MONOS PCT: 6 % (ref 3–12)
Monocytes Absolute: 0.6 10*3/uL (ref 0.1–1.0)
Neutro Abs: 6.5 10*3/uL (ref 1.7–7.7)
Neutrophils Relative %: 64 % (ref 43–77)
Platelets: 296 10*3/uL (ref 150–400)
RBC: 4.66 MIL/uL (ref 3.87–5.11)
RDW: 12.8 % (ref 11.5–15.5)
WBC: 10.2 10*3/uL (ref 4.0–10.5)

## 2013-11-25 NOTE — Assessment & Plan Note (Signed)
Multifactorial with chronic atrial fibrillation, valvular heart disease, and also venous reflux. Agree with compression stockings. She also continues on diuretic.

## 2013-11-25 NOTE — Progress Notes (Signed)
Quick Note:  PT is aware and will go by the lab today. ______

## 2013-11-25 NOTE — Assessment & Plan Note (Signed)
Continue Coumadin and rate control regimen.

## 2013-11-25 NOTE — Patient Instructions (Signed)
Your physician wants you to follow-up in: 4 months You will receive a reminder letter in the mail two months in advance. If you don't receive a letter, please call our office to schedule the follow-up appointment.   Your physician recommends that you continue on your current medications as directed. Please refer to the Current Medication list given to you today.      Thank you for choosing [facility] !         

## 2013-11-25 NOTE — Assessment & Plan Note (Signed)
Rheumatic mitral valve with moderate mitral regurgitation. PASP 45 mm mercury. 

## 2013-11-25 NOTE — Progress Notes (Signed)
Clinical Summary [name] is an 80 y.o.female last seen in July. In the interim she did see Dr. Kellie Simmering with diagnosis of deep venous reflux in her legs, no specific intervention pursued although elastic compression stockings recommended. We have also had her on Aldactone.  Tells me that she has not yet tried her compression stockings. Also reports that she recently was noted to have Hemoccult-positive stools on stool cards. She will be following up with GI soon. She has not noticed any melena or hematochezia.  Followup echocardiogram from July revealed normal LV wall thickness with LVEF 60-65% and increased left atrial pressure, moderate aortic regurgitation, rheumatic mitral valve without significant stenosis but moderate mitral regurgitation, severe left atrial enlargement, mild RV enlargement, moderate to severe right atrial enlargement, PASP 45 mm mercury.   Allergies  Allergen Reactions  . Amitiza [Lubiprostone] Swelling  . Ciprofloxacin Nausea And Vomiting  . Clindamycin/Lincomycin     Nauseated/headache  . Doxycycline     Nauseated/headache  . Erwinaze [Asparaginase Erwinia]     Told not to take by dr  . Rolan Lipa [Linaclotide] Swelling and Rash  . Sulfonamide Derivatives Nausea And Vomiting and Rash    Current Outpatient Prescriptions  Medication Sig Dispense Refill  . ALPRAZolam (XANAX) 0.5 MG tablet Take 0.5 mg by mouth at bedtime as needed. Sleep      . amoxicillin (AMOXIL) 250 MG capsule Take 250 mg by mouth every 3 (three) days.      Marland Kitchen diltiazem (CARDIZEM CD) 300 MG 24 hr capsule Take 1 capsule (300 mg total) by mouth daily.  90 capsule  3  . esomeprazole (NEXIUM) 40 MG capsule Take 1 capsule (40 mg total) by mouth daily before breakfast.  30 capsule  11  . furosemide (LASIX) 20 MG tablet Take 20 mg by mouth 2 (two) times daily.      Marland Kitchen HYDROcodone-acetaminophen (NORCO) 10-325 MG per tablet Take 1 tablet by mouth every 6 (six) hours as needed.      . hydrocortisone  (CORTEF) 10 MG tablet Take 10 mg by mouth daily.       . hydrocortisone cream 1 % Apply 1 application topically daily as needed for itching.      . levothyroxine (SYNTHROID, LEVOTHROID) 125 MCG tablet Take 125 mcg by mouth daily.      . mirabegron ER (MYRBETRIQ) 25 MG TB24 tablet Take 25 mg by mouth daily.      . ondansetron (ZOFRAN) 4 MG tablet Take 1 tablet (4 mg total) by mouth every 8 (eight) hours as needed for nausea or vomiting.  30 tablet  1  . Probiotic Product (ALIGN PO) Take 1 tablet by mouth daily.      . propranolol ER (INDERAL LA) 80 MG 24 hr capsule Take 80 mg by mouth daily.      Marland Kitchen spironolactone (ALDACTONE) 25 MG tablet Take 1 tablet (25 mg total) by mouth daily.  90 tablet  3  . traMADol (ULTRAM) 50 MG tablet Take 50 mg by mouth as needed for moderate pain.      Marland Kitchen warfarin (COUMADIN) 2.5 MG tablet Take 1/2 tablet daily except 1 tablet on Mondays and Fridays or as directed       No current facility-administered medications for this visit.    Past Medical History  Diagnosis Date  . Rheumatic heart disease   . IBS (irritable bowel syndrome)   . GERD (gastroesophageal reflux disease)   . Breast cancer   . Mitral regurgitation   .  Mitral stenosis   . Atrial fibrillation   . PAT (paroxysmal atrial tachycardia)   . Hypothyroidism   . Melanoma of neck   . Microscopic colitis 2003    Was maintained on Asacol BID  . S/P colonoscopy April 2010    Friable anal canal, left-sided diverticula, adenomatous polyp  . S/P endoscopy April 2010    Benign polyp, s/p 54-F Maloney dilation  . Hiatal hernia   . Diverticula of colon   . Tubulovillous adenoma   . Adenomatous polyp   . Sleep apnea     CPAP at night  . History of cardiac catheterization     Normal coronaries in 2006    Social History Ms. Cimini reports that she has never smoked. She has never used smokeless tobacco. Ms. Garbett reports that she does not drink alcohol.  Review of Systems Other systems reviewed and  negative.  Physical Examination Filed Vitals:   11/25/13 0842  BP: 112/64  Pulse: 60   Filed Weights   11/25/13 0842  Weight: 127 lb (57.607 kg)    Elderly woman, appears comfortable at rest.  HEENT: Conjunctiva and lids are normal, oropharynx clear.  Neck: Supple, no elevated jugular venous pressure, no carotid bruits. No thyromegaly.  Lungs: Diminished breath sounds, no rales, nonlabored.  Cardiac: Irregularly irregular, 2/6 apical systolic murmur, no S3 gallop, no pericardial rub.  Abdomen: Soft, protuberant, bowel sounds present.  Extremities: Erythema with bilateral venous varicosities and 1+ pitting edema, distal pulses one plus.  Neuropsychiatric: Alert oriented x3, affect appropriate.  Skin: Warm and dry.  Musculoskeletal: Kyphosis noted.   Problem List and Plan   Atrial fibrillation Continue Coumadin and rate control regimen.  Aortic valve disease Moderate aortic regurgitation.  Mitral valve disease Rheumatic mitral valve with moderate mitral regurgitation. PASP 45 mm mercury.  Leg edema Multifactorial with chronic atrial fibrillation, valvular heart disease, and also venous reflux. Agree with compression stockings. She also continues on diuretic.    Satira Sark, M.D., F.A.C.C.

## 2013-11-25 NOTE — Assessment & Plan Note (Signed)
Moderate aortic regurgitation.

## 2013-12-08 NOTE — Progress Notes (Signed)
Quick Note:  No anemia on CBC. She did have a positive ifobt, but without evidence of anemia, this is non-specific. Jan 2015 EGD on file, last colonoscopy 2013. She reported "black stool" at office visit but I question if this is truly accurate. Can we get an update on her? With a normal CBC, does not appear to have an acute process. ______

## 2013-12-30 NOTE — Progress Notes (Signed)
Quick Note:  Noted. Please have her contact us if any true evidence of melena such as sticky, tarry, pitch black stool. ______

## 2014-01-05 ENCOUNTER — Ambulatory Visit (INDEPENDENT_AMBULATORY_CARE_PROVIDER_SITE_OTHER): Payer: Medicare Other | Admitting: *Deleted

## 2014-01-05 ENCOUNTER — Telehealth: Payer: Self-pay | Admitting: Cardiology

## 2014-01-05 DIAGNOSIS — Z5181 Encounter for therapeutic drug level monitoring: Secondary | ICD-10-CM

## 2014-01-05 DIAGNOSIS — I4891 Unspecified atrial fibrillation: Secondary | ICD-10-CM

## 2014-01-05 LAB — POCT INR: INR: 2

## 2014-01-05 MED ORDER — DILTIAZEM HCL ER COATED BEADS 300 MG PO CP24: 300.0000 mg | ORAL_CAPSULE | Freq: Every day | ORAL | Status: DC

## 2014-01-05 MED ORDER — WARFARIN SODIUM 2.5 MG PO TABS: ORAL_TABLET | ORAL | Status: DC

## 2014-01-05 NOTE — Telephone Encounter (Signed)
Pharmacist says amlodipine is cheapest. Will forward to Dr. Domenic Polite for dose.

## 2014-01-05 NOTE — Telephone Encounter (Signed)
[name] from [employer] confirmed diltiazem is cheapest CCB for afib and HR. Pt made aware and states she will call back if she cannot afford to stay on medication. Pt has enough meds through end of the year.

## 2014-01-05 NOTE — Telephone Encounter (Signed)
[name] is not on Cardizem CD for blood pressure, she is on this for rate control with history of atrial fibrillation and paroxysmal atrial tachycardia. She has done well on this dose, and it would be best to keep her on that if at all possible. Norvasc would not address rate control, and may lead to worsening of leg edema which has also been an issue for her. If there are other calcium channel blockers similar to diltiazem CD that are cheaper, we might consider this (long acting verapamil might be an option).

## 2014-01-05 NOTE — Telephone Encounter (Signed)
Will ask nursing to check on this.

## 2014-01-05 NOTE — Telephone Encounter (Signed)
Patient wants to know if her Diltiazem can be changed to something else due to insurance purposes. / tgs

## 2014-01-07 ENCOUNTER — Encounter: Payer: Self-pay | Admitting: Internal Medicine

## 2014-01-27 ENCOUNTER — Ambulatory Visit (HOSPITAL_COMMUNITY)
Admission: RE | Admit: 2014-01-27 | Discharge: 2014-01-27 | Disposition: A | Discharge status: Home / Self Care | Payer: Medicare Other | Source: Ambulatory Visit | Attending: Internal Medicine | Admitting: Internal Medicine

## 2014-01-27 ENCOUNTER — Other Ambulatory Visit (HOSPITAL_COMMUNITY): Payer: Self-pay | Admitting: Internal Medicine

## 2014-01-27 DIAGNOSIS — M546 Pain in thoracic spine: Secondary | ICD-10-CM | POA: Diagnosis present

## 2014-01-27 DIAGNOSIS — T1490XA Injury, unspecified, initial encounter: Secondary | ICD-10-CM

## 2014-01-27 DIAGNOSIS — M549 Dorsalgia, unspecified: Secondary | ICD-10-CM

## 2014-01-27 DIAGNOSIS — M810 Age-related osteoporosis without current pathological fracture: Secondary | ICD-10-CM | POA: Insufficient documentation

## 2014-02-16 ENCOUNTER — Ambulatory Visit (INDEPENDENT_AMBULATORY_CARE_PROVIDER_SITE_OTHER): Payer: Medicare Other | Admitting: *Deleted

## 2014-02-16 DIAGNOSIS — Z5181 Encounter for therapeutic drug level monitoring: Secondary | ICD-10-CM

## 2014-02-16 DIAGNOSIS — I4891 Unspecified atrial fibrillation: Secondary | ICD-10-CM

## 2014-02-16 LAB — POCT INR: INR: 2.7

## 2014-02-19 ENCOUNTER — Encounter: Payer: Self-pay | Admitting: Gastroenterology

## 2014-02-19 ENCOUNTER — Ambulatory Visit (INDEPENDENT_AMBULATORY_CARE_PROVIDER_SITE_OTHER): Payer: Medicare Other | Admitting: Gastroenterology

## 2014-02-19 ENCOUNTER — Telehealth: Payer: Self-pay

## 2014-02-19 VITALS — BP 126/74 | HR 93 | Temp 97.9°F | Ht 61.0 in | Wt 124.6 lb

## 2014-02-19 DIAGNOSIS — K921 Melena: Secondary | ICD-10-CM

## 2014-02-19 NOTE — Telephone Encounter (Signed)
[name], this pt was seen in our office today by Laban Emperor, NP. We would like to know if it is OK for pt to hold coumadin x 3 days for an EGD and Givens to be scheduled with Dr. Gala Romney in the near future.  Please advise!

## 2014-02-19 NOTE — Telephone Encounter (Signed)
Is it OK with you to hold coumadin 3 days for EGD and possible capsule study by Dr Gala Romney?

## 2014-02-19 NOTE — Patient Instructions (Addendum)
We have scheduled you for an upper endoscopy with likely capsule placement to evaluate your small bowel. I am speaking with Edrick Oh, RN, about stopping your Coumadin 2-3 days before the procedure.

## 2014-02-19 NOTE — Progress Notes (Signed)
Referring Provider: Redmond School, MD Primary Care Physician:  [name], MD  Primary GI: Dr. Gala Romney   Chief Complaint  Patient presents with  . Rectal Bleeding    HPI:   [name] is a 80 y.o. female presenting today with a history of abdominal pain, constipation, nausea, dysphagia. EGD Jan 2015 with esophagitis, empiric dilation. Unable to take Linzess or Amitiza. Miralax burns "going down". April 2015 CTA without evidence of significant occlusive disease, performed secondary to abdominal pain, weight loss. She has steadily lost weight over the past year, normally ranging in upper 130s/140s but now 124. Reports a poor appetite but denies dysphagia. Eating ensure and yogurt. Chronic lower abdominal discomfort unchanged and at baseline. Taking [place] and IBgard. Has multiple non-GI complaints concerning toe fungus, chronic lower extremity edema, bladder infections.   Notes black stool, "like tar". Intermittently for the past year per patient. Heme positive recently. On Coumadin. States blood work done by PCP recently but not available at time of visit. Oct 2015 Hgb 15.1.   Past Medical History  Diagnosis Date  . Rheumatic heart disease   . IBS (irritable bowel syndrome)   . GERD (gastroesophageal reflux disease)   . Breast cancer   . Mitral regurgitation   . Mitral stenosis   . Atrial fibrillation   . PAT (paroxysmal atrial tachycardia)   . Hypothyroidism   . Melanoma of neck   . Microscopic colitis 2003    Was maintained on Asacol BID  . S/P colonoscopy April 2010    Friable anal canal, left-sided diverticula, adenomatous polyp  . S/P endoscopy April 2010    Benign polyp, s/p 54-F Maloney dilation  . Hiatal hernia   . Diverticula of colon   . Tubulovillous adenoma   . Adenomatous polyp   . Sleep apnea     CPAP at night  . History of cardiac catheterization     Normal coronaries in 2006    Past Surgical History  Procedure Laterality Date  . Abdominal  hysterectomy    . Melanoma removal      SURGERY NECK  . Cataracts    . Hemorrhoid surgery    . Tonsillectomy    . Colonoscopy  06/12/2008    Dr. Laurine Blazer anal canal, left-sided diverticula, adenomatous polyp  . Esophagogastroduodenoscopy  06/12/2008    Dr. Marice Potter polyp, s/p 54-F Venia Minks dilation  . Colonoscopy  09/28/2011    Dr. Gala Romney- tubular adenoma, colonic diverticulosis  . Cholecystectomy    . Mastectomy      RIGHT BREAST, followed by left breast after radiation  . Esophagogastroduodenoscopy (egd) with esophageal dilation N/A 02/28/2013    Dr. Rourk:Mild changes of reflux esophagitis. Status post passage of a Maloney dilator. Hiatal hernia. Gastric erosions and gastric polyps-, benign fundic gland polyp    Current Outpatient Prescriptions  Medication Sig Dispense Refill  . ALPRAZolam (XANAX) 0.5 MG tablet Take 0.5 mg by mouth at bedtime as needed. Sleep    . diltiazem (CARDIZEM CD) 300 MG 24 hr capsule Take 1 capsule (300 mg total) by mouth daily. 90 capsule 3  . esomeprazole (NEXIUM) 40 MG capsule Take 1 capsule (40 mg total) by mouth daily before breakfast. 30 capsule 11  . furosemide (LASIX) 20 MG tablet Take 20 mg by mouth 2 (two) times daily.    Marland Kitchen HYDROcodone-acetaminophen (NORCO) 10-325 MG per tablet Take 1 tablet by mouth every 6 (six) hours as needed.    . hydrocortisone (CORTEF) 10 MG tablet Take  10 mg by mouth daily.     . hydrocortisone cream 1 % Apply 1 application topically daily as needed for itching.    . levothyroxine (SYNTHROID, LEVOTHROID) 125 MCG tablet Take 125 mcg by mouth daily.    . mirabegron ER (MYRBETRIQ) 25 MG TB24 tablet Take 25 mg by mouth daily.    . ondansetron (ZOFRAN) 4 MG tablet Take 1 tablet (4 mg total) by mouth every 8 (eight) hours as needed for nausea or vomiting. 30 tablet 1  . Probiotic Product (ALIGN PO) Take 1 tablet by mouth daily.    . propranolol ER (INDERAL LA) 80 MG 24 hr capsule Take 80 mg by mouth daily.    Marland Kitchen spironolactone  (ALDACTONE) 25 MG tablet Take 1 tablet (25 mg total) by mouth daily. 90 tablet 3  . traMADol (ULTRAM) 50 MG tablet Take 50 mg by mouth as needed for moderate pain.    Marland Kitchen warfarin (COUMADIN) 2.5 MG tablet Take 1/2 tablet daily except 1 tablet on Mondays and Fridays or as directed 30 tablet 6  . amoxicillin (AMOXIL) 250 MG capsule Take 250 mg by mouth every 3 (three) days.     No current facility-administered medications for this visit.    Allergies as of 02/19/2014 - Review Complete 02/19/2014  Allergen Reaction Noted  . Amitiza [lubiprostone] Swelling 01/10/2012  . Ciprofloxacin Nausea And Vomiting 06/07/2011  . Clindamycin/lincomycin  12/30/2012  . Doxycycline  12/30/2012  . Erwinaze [asparaginase erwinia]  08/13/2013  . Linzess [linaclotide] Swelling and Rash 02/27/2012  . Sulfonamide derivatives Nausea And Vomiting and Rash 10/23/2007    Family History  Problem Relation Age of Onset  . Stroke Mother   . Heart attack Father   . Breast cancer Sister   . Colon cancer Neg Hx     History   Social History  . Marital Status: Single    Spouse Name: N/A    Number of Children: N/A  . Years of Education: N/A   Occupational History  . RETIRED    Social History Main Topics  . Smoking status: Never Smoker   . Smokeless tobacco: Never Used  . Alcohol Use: No  . Drug Use: No  . Sexual Activity: No   Other Topics Concern  . None   Social History Narrative    Review of Systems: As mentioned in HPI.   Physical Exam: BP 126/74 mmHg  Pulse 93  Temp(Src) 97.9 F (36.6 C) (Oral)  Ht 5\' 1"  (1.549 m)  Wt 124 lb 9.6 oz (56.518 kg)  BMI 23.56 kg/m2 General:   Alert and oriented. No distress noted. Pleasant and cooperative.  Head:  Normocephalic and atraumatic. Eyes:  Conjuctiva clear without scleral icterus. Mouth:  Oral mucosa pink and moist.  Heart:  S1, S2 present, irregularly irregular consistent with known history of afib.  Abdomen:  +BS, soft, non-tender and  non-distended. No rebound or guarding. Ventral hernia Msk:  Symmetrical without gross deformities. Normal posture. Extremities:  Chronic venous stasis changes.  Neurologic:  Alert and  oriented x4;  grossly normal neurologically. Skin:  Intact without significant lesions or rashes. Psych:  Alert and cooperative. Normal mood and affect.  Vitals - 1 value per visit 02/19/2014 11/25/2013 11/05/2013 09/02/2013  Weight (lb) 124.6 127 131.6 130   Vitals - 1 value per visit 08/26/2013 08/13/2013 07/01/2013 05/21/2013 05/07/2013  Weight (lb) 130 132 141.2 135 138.6   Vitals - 1 value per visit 02/28/2013 02/12/2013 02/06/2013 12/30/2012  Weight (lb) 140 140  139.75   Vitals - 1 value per visit 11/21/2012 08/12/2012 03/08/2012 03/01/2012 02/27/2012  Weight (lb) 144.75 134.25 145.08  150.79   Lab Results  Component Value Date   WBC 10.2 11/25/2013   HGB 15.1* 11/25/2013   HCT 43.3 11/25/2013   MCV 92.9 11/25/2013   PLT 296 11/25/2013

## 2014-02-20 NOTE — Telephone Encounter (Signed)
Yes - reasonable to hold Coumadin (without bridging) for endoscopy.

## 2014-02-22 NOTE — Assessment & Plan Note (Signed)
81 year old female with reports of intermittent episodes of black, tarry stool in the presence of Coumadin for afib. Notes intermittently for the past year, with Hgb in October 15.1. Requesting most recent CBC. Last EGD in Jan 2015 with reflux esophagitis, empiric dilation, gastric erosions and polyps. Last colonoscopy with tubular adenoma in Aug 2013. Due to reports of melena, would recommend repeat EGD with +/- capsule study to evaluate small bowel, specifically due to known presence of Coumadin. May ultimately need early interval colonoscopy to evaluate for right-sided colonic lesion, which could be culprit for melena if EGD/capsule negative. With Coumadin, concern for occult small bowel etiology. As of note, patient denies iron use, pepto bismol, or any other agents that could potentially cause black stool.   Proceed with upper endoscopy in the near future with Dr. Gala Romney. The risks, benefits, and alternatives have been discussed in detail with patient. They have stated understanding and desire to proceed.  POSSIBLE CAPSULE STUDY at time of EGD HOLD COUMADIN X 3 days prior: ok per cardiology Possible early interval colonoscopy if EGD/capsule negative

## 2014-02-22 NOTE — Telephone Encounter (Signed)
Ok per cardiology to hold Coumadin X 3 days prior to EGD without bridging needed.   Please arrange for EGD+/- capsule study at time of EGD with Dr. Gala Romney.  Needs appt shortly thereafter with Edrick Oh, RN, to check INR and adjust Coumadin as necessary.

## 2014-02-23 ENCOUNTER — Other Ambulatory Visit: Payer: Self-pay

## 2014-02-23 DIAGNOSIS — K921 Melena: Secondary | ICD-10-CM

## 2014-02-23 NOTE — Telephone Encounter (Signed)
Checked pts insurance and chatted with Clydene Pugh. No PA is needed for Givens. Reference number is Clydene Pugh [phone]

## 2014-02-23 NOTE — Telephone Encounter (Signed)
Pt is scheduled for 03/04/2014 @ 830am

## 2014-02-23 NOTE — Telephone Encounter (Signed)
Pt is aware to hold Coumadin for 3 days and instructions have been mailed

## 2014-02-23 NOTE — Telephone Encounter (Signed)
Called pt and wants to schedule but needs to check with her ride first.

## 2014-02-23 NOTE — Telephone Encounter (Signed)
Pt is scheduled for 03/04/2014 for Givens and EGD

## 2014-02-24 NOTE — Progress Notes (Signed)
cc'ed to pcp °

## 2014-03-11 ENCOUNTER — Other Ambulatory Visit (HOSPITAL_COMMUNITY): Payer: Self-pay | Admitting: Podiatry

## 2014-03-11 DIAGNOSIS — I739 Peripheral vascular disease, unspecified: Secondary | ICD-10-CM

## 2014-03-16 ENCOUNTER — Encounter (HOSPITAL_COMMUNITY)
Admission: RE | Disposition: A | Discharge status: Home / Self Care | Payer: Self-pay | Source: Ambulatory Visit | Attending: Internal Medicine

## 2014-03-16 ENCOUNTER — Other Ambulatory Visit: Payer: Self-pay

## 2014-03-16 ENCOUNTER — Ambulatory Visit (HOSPITAL_COMMUNITY)
Admission: RE | Admit: 2014-03-16 | Discharge: 2014-03-16 | Disposition: A | Discharge status: Home / Self Care | Payer: Medicare Other | Source: Ambulatory Visit | Attending: Internal Medicine | Admitting: Internal Medicine

## 2014-03-16 ENCOUNTER — Telehealth: Payer: Self-pay | Admitting: General Practice

## 2014-03-16 DIAGNOSIS — R109 Unspecified abdominal pain: Secondary | ICD-10-CM | POA: Insufficient documentation

## 2014-03-16 DIAGNOSIS — K317 Polyp of stomach and duodenum: Secondary | ICD-10-CM | POA: Diagnosis not present

## 2014-03-16 DIAGNOSIS — Z79899 Other long term (current) drug therapy: Secondary | ICD-10-CM | POA: Insufficient documentation

## 2014-03-16 DIAGNOSIS — I050 Rheumatic mitral stenosis: Secondary | ICD-10-CM | POA: Diagnosis not present

## 2014-03-16 DIAGNOSIS — G4730 Sleep apnea, unspecified: Secondary | ICD-10-CM | POA: Insufficient documentation

## 2014-03-16 DIAGNOSIS — Z881 Allergy status to other antibiotic agents status: Secondary | ICD-10-CM | POA: Insufficient documentation

## 2014-03-16 DIAGNOSIS — E039 Hypothyroidism, unspecified: Secondary | ICD-10-CM | POA: Insufficient documentation

## 2014-03-16 DIAGNOSIS — I4891 Unspecified atrial fibrillation: Secondary | ICD-10-CM | POA: Insufficient documentation

## 2014-03-16 DIAGNOSIS — Z7901 Long term (current) use of anticoagulants: Secondary | ICD-10-CM | POA: Diagnosis not present

## 2014-03-16 DIAGNOSIS — Z882 Allergy status to sulfonamides status: Secondary | ICD-10-CM | POA: Diagnosis not present

## 2014-03-16 DIAGNOSIS — R195 Other fecal abnormalities: Secondary | ICD-10-CM | POA: Insufficient documentation

## 2014-03-16 DIAGNOSIS — K3189 Other diseases of stomach and duodenum: Secondary | ICD-10-CM | POA: Insufficient documentation

## 2014-03-16 DIAGNOSIS — K5900 Constipation, unspecified: Secondary | ICD-10-CM | POA: Insufficient documentation

## 2014-03-16 DIAGNOSIS — K921 Melena: Secondary | ICD-10-CM | POA: Insufficient documentation

## 2014-03-16 DIAGNOSIS — R1310 Dysphagia, unspecified: Secondary | ICD-10-CM | POA: Diagnosis not present

## 2014-03-16 DIAGNOSIS — R110 Nausea: Secondary | ICD-10-CM | POA: Diagnosis not present

## 2014-03-16 DIAGNOSIS — K219 Gastro-esophageal reflux disease without esophagitis: Secondary | ICD-10-CM | POA: Diagnosis not present

## 2014-03-16 HISTORY — PX: ESOPHAGOGASTRODUODENOSCOPY: SHX5428

## 2014-03-16 LAB — CBC
HCT: 31.6 % — ABNORMAL LOW (ref 36.0–46.0)
Hemoglobin: 11.2 g/dL — ABNORMAL LOW (ref 12.0–15.0)
MCH: 33.7 pg (ref 26.0–34.0)
MCHC: 35.4 g/dL (ref 30.0–36.0)
MCV: 95.2 fL (ref 78.0–100.0)
PLATELETS: 204 10*3/uL (ref 150–400)
RBC: 3.32 MIL/uL — ABNORMAL LOW (ref 3.87–5.11)
RDW: 13 % (ref 11.5–15.5)
WBC: 5.7 10*3/uL (ref 4.0–10.5)

## 2014-03-16 SURGERY — EGD (ESOPHAGOGASTRODUODENOSCOPY)
Anesthesia: Moderate Sedation

## 2014-03-16 MED ORDER — STERILE WATER FOR IRRIGATION IR SOLN
Status: DC | PRN
  Administered 2014-03-16: 08:00:00

## 2014-03-16 MED ORDER — ONDANSETRON HCL 4 MG/2ML IJ SOLN
INTRAMUSCULAR | Status: AC
  Filled 2014-03-16: qty 2

## 2014-03-16 MED ORDER — ONDANSETRON HCL 4 MG/2ML IJ SOLN
INTRAMUSCULAR | Status: DC | PRN
  Administered 2014-03-16: 4 mg via INTRAVENOUS

## 2014-03-16 MED ORDER — LIDOCAINE VISCOUS 2 % MT SOLN
OROMUCOSAL | Status: AC
  Filled 2014-03-16: qty 15

## 2014-03-16 MED ORDER — MIDAZOLAM HCL 5 MG/5ML IJ SOLN
INTRAMUSCULAR | Status: DC | PRN
  Administered 2014-03-16 (×2): 1 mg via INTRAVENOUS
  Administered 2014-03-16: 2 mg via INTRAVENOUS

## 2014-03-16 MED ORDER — MEPERIDINE HCL 100 MG/ML IJ SOLN
INTRAMUSCULAR | Status: AC
  Filled 2014-03-16: qty 2

## 2014-03-16 MED ORDER — MIDAZOLAM HCL 5 MG/5ML IJ SOLN
INTRAMUSCULAR | Status: AC
  Filled 2014-03-16: qty 10

## 2014-03-16 MED ORDER — MEPERIDINE HCL 100 MG/ML IJ SOLN
INTRAMUSCULAR | Status: DC | PRN
  Administered 2014-03-16 (×2): 25 mg via INTRAVENOUS

## 2014-03-16 MED ORDER — SODIUM CHLORIDE 0.9 % IV SOLN
INTRAVENOUS | Status: DC
  Administered 2014-03-16: 1000 mL via INTRAVENOUS

## 2014-03-16 MED ORDER — LIDOCAINE VISCOUS 2 % MT SOLN
OROMUCOSAL | Status: DC | PRN
  Administered 2014-03-16: 4 mL via OROMUCOSAL

## 2014-03-16 NOTE — Op Note (Signed)
[facility] [address] [place], [address]   ENDOSCOPY PROCEDURE REPORT  PATIENT: Genelle, Economou  MR#: [id number] BIRTHDATE: 1929-04-04 , 84  yrs. old GENDER: female ENDOSCOPIST: R.  Garfield Cornea, MD FACP FACG REFERRED BY:  Kerin Perna, M.D. PROCEDURE DATE:  03-26-2014 PROCEDURE:  EGD w/ biopsy INDICATIONS:  Melena; Hemoccult positive stool; hemoglobin remains normal going back 2 years with last assessment approximately 3 months ago.  Last Coumadin 4 days ago. MEDICATIONS: Versed 4 mg IV and Demerol 50 mg IV in divided doses. Zofran 4 mg IV.  Xylocaine gel orally. ASA CLASS:      Class III  CONSENT: The risks, benefits, limitations, alternatives and imponderables have been discussed.  The potential for biopsy, esophogeal dilation, etc. have also been reviewed.  Questions have been answered.  All parties agreeable.  Please see the history and physical in the medical record for more information.  DESCRIPTION OF PROCEDURE: After the risks benefits and alternatives of the procedure were thoroughly explained, informed consent was obtained.  The EG-[id number] ([id number]) endoscope was introduced through the mouth and advanced to the second portion of the duodenum , limited by Without limitations. The instrument was slowly withdrawn as the mucosa was fully examined.    Normal, patent appearing tubular esophagus.  Stomach empty. Multiple 1-3 mm gastric polyps in the body and fundus.  Patient had striking extensive linear erythema, possibly vascular ectasia, radiating from the antrum back into the more proximal stomach - this was extensive - see photos.  There were scattered gastric mucosal erosions.  No ulcer or infiltrating process.  Patent pylorus.  Examination of the bulb and second portion revealed a couple bulbar erosions and very friable mucosa.  No ulcer or infiltrating process seen.         The scope was then withdrawn from the patient and the procedure  completed.  Biopsies of the polyps as well as abnormal gastric mucosa taken.  COMPLICATIONS: There were no immediate complications.  ENDOSCOPIC IMPRESSION: Abnormal stomach as described above. 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 No known underlying liver disease.   No NSAID's reported. Patient could easily be bleeding from her stomach. However hemoglobin has been normal. A capsule not indicated at this time. Status post biopsies as described.  RECOMMENDATIONS: Resume Coumadin today. Follow up on pathology. Ultrasound with elastography to screen for underlying liver disease.  CBC today.  REPEAT EXAM:  eSigned:  R. Garfield Cornea, MD [facility] 03/26/14 8:17 AM    CC:  CPT CODES: ICD CODES:  The ICD and CPT codes recommended by this software are interpretations from the data that the clinical staff has captured with the software.  The verification of the translation of this report to the ICD and CPT codes and modifiers is the sole responsibility of the health care institution and practicing physician where this report was generated.  [employer] will not be held responsible for the validity of the ICD and CPT codes included on this report.  AMA assumes no liability for data contained or not contained herein. CPT is a Designer, television/film set of the [employer].  PATIENT NAME:  Evalise, Abruzzese MR#: [id number]

## 2014-03-16 NOTE — Telephone Encounter (Signed)
Per RMR please schedule patient for an ultrasound with elagastropgy

## 2014-03-16 NOTE — Telephone Encounter (Signed)
Correction ultrasound with elastography Re: evaluate for liver disease   Routing to Clinical Pool

## 2014-03-16 NOTE — Telephone Encounter (Signed)
Pt is scheduled for 03/20/2014. Pt needs to be fasting. Appt time is 900am. Pt aware.

## 2014-03-16 NOTE — H&P (View-Only) (Signed)
Referring Provider: Redmond School, MD Primary Care Physician:  [name], MD  Primary GI: Dr. Gala Romney   Chief Complaint  Patient presents with  . Rectal Bleeding    HPI:   [name] is a 81 y.o. female presenting today with a history of abdominal pain, constipation, nausea, dysphagia. EGD Jan 2015 with esophagitis, empiric dilation. Unable to take Linzess or Amitiza. Miralax burns "going down". April 2015 CTA without evidence of significant occlusive disease, performed secondary to abdominal pain, weight loss. She has steadily lost weight over the past year, normally ranging in upper 130s/140s but now 124. Reports a poor appetite but denies dysphagia. Eating ensure and yogurt. Chronic lower abdominal discomfort unchanged and at baseline. Taking Allen and IBgard. Has multiple non-GI complaints concerning toe fungus, chronic lower extremity edema, bladder infections.   Notes black stool, "like tar". Intermittently for the past year per patient. Heme positive recently. On Coumadin. States blood work done by PCP recently but not available at time of visit. Oct 2015 Hgb 15.1.   Past Medical History  Diagnosis Date  . Rheumatic heart disease   . IBS (irritable bowel syndrome)   . GERD (gastroesophageal reflux disease)   . Breast cancer   . Mitral regurgitation   . Mitral stenosis   . Atrial fibrillation   . PAT (paroxysmal atrial tachycardia)   . Hypothyroidism   . Melanoma of neck   . Microscopic colitis 2003    Was maintained on Asacol BID  . S/P colonoscopy April 2010    Friable anal canal, left-sided diverticula, adenomatous polyp  . S/P endoscopy April 2010    Benign polyp, s/p 54-F Maloney dilation  . Hiatal hernia   . Diverticula of colon   . Tubulovillous adenoma   . Adenomatous polyp   . Sleep apnea     CPAP at night  . History of cardiac catheterization     Normal coronaries in 2006    Past Surgical History  Procedure Laterality Date  . Abdominal  hysterectomy    . Melanoma removal      SURGERY NECK  . Cataracts    . Hemorrhoid surgery    . Tonsillectomy    . Colonoscopy  06/12/2008    Dr. Laurine Blazer anal canal, left-sided diverticula, adenomatous polyp  . Esophagogastroduodenoscopy  06/12/2008    Dr. Marice Potter polyp, s/p 54-F Venia Minks dilation  . Colonoscopy  09/28/2011    Dr. Gala Romney- tubular adenoma, colonic diverticulosis  . Cholecystectomy    . Mastectomy      RIGHT BREAST, followed by left breast after radiation  . Esophagogastroduodenoscopy (egd) with esophageal dilation N/A 02/28/2013    Dr. Rourk:Mild changes of reflux esophagitis. Status post passage of a Maloney dilator. Hiatal hernia. Gastric erosions and gastric polyps-, benign fundic gland polyp    Current Outpatient Prescriptions  Medication Sig Dispense Refill  . ALPRAZolam (XANAX) 0.5 MG tablet Take 0.5 mg by mouth at bedtime as needed. Sleep    . diltiazem (CARDIZEM CD) 300 MG 24 hr capsule Take 1 capsule (300 mg total) by mouth daily. 90 capsule 3  . esomeprazole (NEXIUM) 40 MG capsule Take 1 capsule (40 mg total) by mouth daily before breakfast. 30 capsule 11  . furosemide (LASIX) 20 MG tablet Take 20 mg by mouth 2 (two) times daily.    Marland Kitchen HYDROcodone-acetaminophen (NORCO) 10-325 MG per tablet Take 1 tablet by mouth every 6 (six) hours as needed.    . hydrocortisone (CORTEF) 10 MG tablet Take  10 mg by mouth daily.     . hydrocortisone cream 1 % Apply 1 application topically daily as needed for itching.    . levothyroxine (SYNTHROID, LEVOTHROID) 125 MCG tablet Take 125 mcg by mouth daily.    . mirabegron ER (MYRBETRIQ) 25 MG TB24 tablet Take 25 mg by mouth daily.    . ondansetron (ZOFRAN) 4 MG tablet Take 1 tablet (4 mg total) by mouth every 8 (eight) hours as needed for nausea or vomiting. 30 tablet 1  . Probiotic Product (ALIGN PO) Take 1 tablet by mouth daily.    . propranolol ER (INDERAL LA) 80 MG 24 hr capsule Take 80 mg by mouth daily.    Marland Kitchen spironolactone  (ALDACTONE) 25 MG tablet Take 1 tablet (25 mg total) by mouth daily. 90 tablet 3  . traMADol (ULTRAM) 50 MG tablet Take 50 mg by mouth as needed for moderate pain.    Marland Kitchen warfarin (COUMADIN) 2.5 MG tablet Take 1/2 tablet daily except 1 tablet on Mondays and Fridays or as directed 30 tablet 6  . amoxicillin (AMOXIL) 250 MG capsule Take 250 mg by mouth every 3 (three) days.     No current facility-administered medications for this visit.    Allergies as of 02/19/2014 - Review Complete 02/19/2014  Allergen Reaction Noted  . Amitiza [lubiprostone] Swelling 01/10/2012  . Ciprofloxacin Nausea And Vomiting 06/07/2011  . Clindamycin/lincomycin  12/30/2012  . Doxycycline  12/30/2012  . Erwinaze [asparaginase erwinia]  08/13/2013  . Linzess [linaclotide] Swelling and Rash 02/27/2012  . Sulfonamide derivatives Nausea And Vomiting and Rash 10/23/2007    Family History  Problem Relation Age of Onset  . Stroke Mother   . Heart attack Father   . Breast cancer Sister   . Colon cancer Neg Hx     History   Social History  . Marital Status: Single    Spouse Name: N/A    Number of Children: N/A  . Years of Education: N/A   Occupational History  . RETIRED    Social History Main Topics  . Smoking status: Never Smoker   . Smokeless tobacco: Never Used  . Alcohol Use: No  . Drug Use: No  . Sexual Activity: No   Other Topics Concern  . None   Social History Narrative    Review of Systems: As mentioned in HPI.   Physical Exam: BP 126/74 mmHg  Pulse 93  Temp(Src) 97.9 F (36.6 C) (Oral)  Ht 5\' 1"  (1.549 m)  Wt 124 lb 9.6 oz (56.518 kg)  BMI 23.56 kg/m2 General:   Alert and oriented. No distress noted. Pleasant and cooperative.  Head:  Normocephalic and atraumatic. Eyes:  Conjuctiva clear without scleral icterus. Mouth:  Oral mucosa pink and moist.  Heart:  S1, S2 present, irregularly irregular consistent with known history of afib.  Abdomen:  +BS, soft, non-tender and  non-distended. No rebound or guarding. Ventral hernia Msk:  Symmetrical without gross deformities. Normal posture. Extremities:  Chronic venous stasis changes.  Neurologic:  Alert and  oriented x4;  grossly normal neurologically. Skin:  Intact without significant lesions or rashes. Psych:  Alert and cooperative. Normal mood and affect.  Vitals - 1 value per visit 02/19/2014 11/25/2013 11/05/2013 09/02/2013  Weight (lb) 124.6 127 131.6 130   Vitals - 1 value per visit 08/26/2013 08/13/2013 07/01/2013 05/21/2013 05/07/2013  Weight (lb) 130 132 141.2 135 138.6   Vitals - 1 value per visit 02/28/2013 02/12/2013 02/06/2013 12/30/2012  Weight (lb) 140 140  139.75   Vitals - 1 value per visit 11/21/2012 08/12/2012 03/08/2012 03/01/2012 02/27/2012  Weight (lb) 144.75 134.25 145.08  150.79   Lab Results  Component Value Date   WBC 10.2 11/25/2013   HGB 15.1* 11/25/2013   HCT 43.3 11/25/2013   MCV 92.9 11/25/2013   PLT 296 11/25/2013

## 2014-03-16 NOTE — Discharge Instructions (Signed)
EGD Discharge instructions Please read the instructions outlined below and refer to this sheet in the next few weeks. These discharge instructions provide you with general information on caring for yourself after you leave the hospital. Your doctor may also give you specific instructions. While your treatment has been planned according to the most current medical practices available, unavoidable complications occasionally occur. If you have any problems or questions after discharge, please call your doctor. ACTIVITY  You may resume your regular activity but move at a slower pace for the next 24 hours.   Take frequent rest periods for the next 24 hours.   Walking will help expel (get rid of) the air and reduce the bloated feeling in your abdomen.   No driving for 24 hours (because of the anesthesia (medicine) used during the test).   You may shower.   Do not sign any important legal documents or operate any machinery for 24 hours (because of the anesthesia used during the test).  NUTRITION  Drink plenty of fluids.   You may resume your normal diet.   Begin with a light meal and progress to your normal diet.   Avoid alcoholic beverages for 24 hours or as instructed by your caregiver.  MEDICATIONS  You may resume your normal medications unless your caregiver tells you otherwise.  WHAT YOU CAN EXPECT TODAY  You may experience abdominal discomfort such as a feeling of fullness or gas pains.  FOLLOW-UP  Your doctor will discuss the results of your test with you.  SEEK IMMEDIATE MEDICAL ATTENTION IF ANY OF THE FOLLOWING OCCUR:  Excessive nausea (feeling sick to your stomach) and/or vomiting.   Severe abdominal pain and distention (swelling).   Trouble swallowing.   Temperature over 101 F (37.8 C).   Rectal bleeding or vomiting of blood.    Resume Coumadin today  Scheduled liver ultrasound with elastography to evaluate further for possible underlying liver  disease  Further recommendations to follow pending review of pathology  CBC today.

## 2014-03-16 NOTE — Interval H&P Note (Signed)
History and Physical Interval Note:  03/16/2014 7:37 AM  [name]  has presented today for surgery, with the diagnosis of Melena  The various methods of treatment have been discussed with the patient and family. After consideration of risks, benefits and other options for treatment, the patient has consented to  Procedure(s) with comments: ESOPHAGOGASTRODUODENOSCOPY (EGD) (N/A) - 1215 - moved to 1/25 @ 7:30 GIVENS CAPSULE STUDY (N/A) as a surgical intervention .  The patient's history has been reviewed, patient examined, no change in status, stable for surgery.  I have reviewed the patient's chart and labs.  Questions were answered to the patient's satisfaction.     Manus Rudd  The risks, benefits, limitations, alternatives and imponderables have been reviewed with the patient. Potential for esophageal dilation, biopsy, etc. have also been reviewed.  Questions have been answered. All parties agreeable.  Coumadin held 4 days.

## 2014-03-17 ENCOUNTER — Encounter (HOSPITAL_COMMUNITY): Payer: Self-pay | Admitting: Internal Medicine

## 2014-03-18 ENCOUNTER — Other Ambulatory Visit (HOSPITAL_COMMUNITY): Payer: Medicare Other

## 2014-03-18 ENCOUNTER — Encounter: Payer: Self-pay | Admitting: Internal Medicine

## 2014-03-19 ENCOUNTER — Encounter: Payer: Self-pay | Admitting: Internal Medicine

## 2014-03-19 NOTE — Progress Notes (Signed)
APPOINTMENT MADE AND LETTER SENT °

## 2014-03-20 ENCOUNTER — Ambulatory Visit (HOSPITAL_COMMUNITY): Payer: Medicare Other

## 2014-03-20 ENCOUNTER — Ambulatory Visit (HOSPITAL_COMMUNITY)
Admission: RE | Admit: 2014-03-20 | Discharge: 2014-03-20 | Disposition: A | Discharge status: Home / Self Care | Payer: Medicare Other | Source: Ambulatory Visit | Attending: Internal Medicine | Admitting: Internal Medicine

## 2014-03-20 ENCOUNTER — Ambulatory Visit (HOSPITAL_COMMUNITY): Admission: RE | Admit: 2014-03-20 | Payer: Medicare Other | Source: Ambulatory Visit

## 2014-03-20 DIAGNOSIS — K760 Fatty (change of) liver, not elsewhere classified: Secondary | ICD-10-CM | POA: Diagnosis not present

## 2014-03-20 DIAGNOSIS — G8929 Other chronic pain: Secondary | ICD-10-CM | POA: Insufficient documentation

## 2014-03-20 DIAGNOSIS — R634 Abnormal weight loss: Secondary | ICD-10-CM | POA: Insufficient documentation

## 2014-03-20 DIAGNOSIS — R112 Nausea with vomiting, unspecified: Secondary | ICD-10-CM | POA: Diagnosis not present

## 2014-03-20 DIAGNOSIS — R109 Unspecified abdominal pain: Secondary | ICD-10-CM | POA: Diagnosis present

## 2014-03-25 ENCOUNTER — Ambulatory Visit (INDEPENDENT_AMBULATORY_CARE_PROVIDER_SITE_OTHER): Payer: Medicare Other | Admitting: Cardiology

## 2014-03-25 ENCOUNTER — Encounter: Payer: Self-pay | Admitting: Cardiology

## 2014-03-25 ENCOUNTER — Ambulatory Visit (INDEPENDENT_AMBULATORY_CARE_PROVIDER_SITE_OTHER): Payer: Medicare Other | Admitting: *Deleted

## 2014-03-25 VITALS — BP 116/72 | HR 69 | Ht 61.0 in | Wt 119.0 lb

## 2014-03-25 DIAGNOSIS — M7989 Other specified soft tissue disorders: Secondary | ICD-10-CM

## 2014-03-25 DIAGNOSIS — I359 Nonrheumatic aortic valve disorder, unspecified: Secondary | ICD-10-CM

## 2014-03-25 DIAGNOSIS — R600 Localized edema: Secondary | ICD-10-CM

## 2014-03-25 DIAGNOSIS — Z5181 Encounter for therapeutic drug level monitoring: Secondary | ICD-10-CM

## 2014-03-25 DIAGNOSIS — I482 Chronic atrial fibrillation, unspecified: Secondary | ICD-10-CM

## 2014-03-25 DIAGNOSIS — I4891 Unspecified atrial fibrillation: Secondary | ICD-10-CM

## 2014-03-25 DIAGNOSIS — I059 Rheumatic mitral valve disease, unspecified: Secondary | ICD-10-CM

## 2014-03-25 LAB — POCT INR: INR: 1.5

## 2014-03-25 NOTE — Patient Instructions (Signed)
Your physician recommends that you schedule a follow-up appointment in: 3 months with Dr. Domenic Polite  Your physician recommends that you continue on your current medications as directed. Please refer to the Current Medication list given to you today.  Your physician recommends that you return for lab work just before your next visit. ( BMET)  Thank you for choosing [facility]!

## 2014-03-25 NOTE — Progress Notes (Signed)
Cardiology Office Note  Date: 03/25/2014   ID: [name], DOB [birth year], MRN [id number]  PCP: [name], MD  Primary Cardiologist: [name], MD   Chief Complaint  Patient presents with  . Valvular heart disease  . Atrial Fibrillation    History of Present Illness:  [name] is an 81 y.o. female last seen in October 2015. She presents for a routine visit today. I reviewed her records, she has been undergoing recent GI evaluation including endoscopy and liver imaging. During this time she states that her leg edema has been worse, specifically relates it to a 3 to 4 day period of time when she stopped her Lasix. She just started back on Lasix yesterday, has otherwise continued Aldactone. She is not wearing compression stockings.  Otherwise, she denies any palpitations or chest pain. She continues on Cardizem CD, Inderal, and Coumadin for atrial fibrillation.  She did see a podiatrist recently for evaluation of fungal changes. I note that lower extremity arterial studies have been ordered to assess her peripheral circulation.   Past Medical History  Diagnosis Date  . Rheumatic heart disease   . IBS (irritable bowel syndrome)   . GERD (gastroesophageal reflux disease)   . Breast cancer   . Mitral regurgitation   . Mitral stenosis   . Atrial fibrillation   . PAT (paroxysmal atrial tachycardia)   . Hypothyroidism   . Melanoma of neck   . Microscopic colitis 2003    Was maintained on Asacol BID  . S/P colonoscopy April 2010    Friable anal canal, left-sided diverticula, adenomatous polyp  . S/P endoscopy April 2010    Benign polyp, s/p 54-F Maloney dilation  . Hiatal hernia   . Diverticula of colon   . Tubulovillous adenoma   . Adenomatous polyp   . Sleep apnea     CPAP at night  . History of cardiac catheterization     Normal coronaries in 2006    Past Surgical History  Procedure Laterality Date  . Abdominal hysterectomy    . Melanoma removal        SURGERY NECK  . Cataracts    . Hemorrhoid surgery    . Tonsillectomy    . Colonoscopy  06/12/2008    Dr. Laurine Blazer anal canal, left-sided diverticula, adenomatous polyp  . Esophagogastroduodenoscopy  06/12/2008    Dr. Marice Potter polyp, s/p 54-F Venia Minks dilation  . Colonoscopy  09/28/2011    Dr. Gala Romney- tubular adenoma, colonic diverticulosis  . Cholecystectomy    . Mastectomy      RIGHT BREAST, followed by left breast after radiation  . Esophagogastroduodenoscopy (egd) with esophageal dilation N/A 02/28/2013    Dr. Rourk:Mild changes of reflux esophagitis. Status post passage of a Maloney dilator. Hiatal hernia. Gastric erosions and gastric polyps-, benign fundic gland polyp  . Esophagogastroduodenoscopy N/A 03/16/2014    Procedure: ESOPHAGOGASTRODUODENOSCOPY (EGD);  Surgeon: Daneil Dolin, MD;  Location: [facility];  Service: Endoscopy;  Laterality: N/A;  1215 - moved to 1/25 @ 7:30    Current Outpatient Prescriptions  Medication Sig Dispense Refill  . ALPRAZolam (XANAX) 0.5 MG tablet Take 0.5 mg by mouth at bedtime as needed. Sleep    . CALCIUM PO Take 1 tablet by mouth daily.    . Cholecalciferol (VITAMIN D-3 PO) Take 1 tablet by mouth daily.    Marland Kitchen diltiazem (CARDIZEM CD) 300 MG 24 hr capsule Take 1 capsule (300 mg total) by mouth daily. 90 capsule 3  .  esomeprazole (NEXIUM) 40 MG capsule Take 1 capsule (40 mg total) by mouth daily before breakfast. 30 capsule 11  . furosemide (LASIX) 20 MG tablet Take 20 mg by mouth 2 (two) times daily.    Marland Kitchen HYDROcodone-acetaminophen (NORCO) 10-325 MG per tablet Take 1 tablet by mouth every 6 (six) hours as needed for moderate pain.     . hydrocortisone (CORTEF) 10 MG tablet Take 10 mg by mouth 2 (two) times daily.     . hydrocortisone cream 1 % Apply 1 application topically daily as needed for itching.    . levothyroxine (SYNTHROID, LEVOTHROID) 125 MCG tablet Take 125 mcg by mouth daily.    . mirabegron ER (MYRBETRIQ) 25 MG TB24 tablet Take  25 mg by mouth daily.    . ondansetron (ZOFRAN) 4 MG tablet Take 1 tablet (4 mg total) by mouth every 8 (eight) hours as needed for nausea or vomiting. 30 tablet 1  . Probiotic Product (ALIGN PO) Take 1 tablet by mouth daily.    . propranolol ER (INDERAL LA) 80 MG 24 hr capsule Take 80 mg by mouth daily.    Marland Kitchen spironolactone (ALDACTONE) 25 MG tablet Take 1 tablet (25 mg total) by mouth daily. 90 tablet 3  . traMADol (ULTRAM) 50 MG tablet Take 50 mg by mouth as needed for moderate pain.    Marland Kitchen warfarin (COUMADIN) 2.5 MG tablet Take 1/2 tablet daily except 1 tablet on Mondays and Fridays or as directed (Patient taking differently: Take 2.5 mg by mouth See admin instructions. Take 1/2 tablet daily except 1 tablet on Mondays and Fridays or as directed) 30 tablet 6   No current facility-administered medications for this visit.    Allergies:  Amitiza; Ciprofloxacin; Clindamycin/lincomycin; Doxycycline; Erwinaze; Linzess; and Sulfonamide derivatives   Social History: The patient  reports that she has never smoked. She has never used smokeless tobacco. She reports that she does not drink alcohol or use illicit drugs.   Family History: The patient's family history includes Breast cancer in her sister; Heart attack in her father; Stroke in her mother. There is no history of Colon cancer.   ROS:  Please see the history of present illness. Otherwise, complete review of systems is positive for abdominal bloating and discomfort, dark stools, fluctuating appetite.   All other systems are reviewed and negative.    PHYSICAL EXAM: VS:  BP 116/72 mmHg  Pulse 69  Ht 5\' 1"  (1.549 m)  Wt 119 lb (53.978 kg)  BMI 22.50 kg/m2  SpO2 95%, BMI Body mass index is 22.5 kg/(m^2).  Wt Readings from Last 3 Encounters:  03/25/14 119 lb (53.978 kg)  03/16/14 124 lb (56.246 kg)  02/19/14 124 lb 9.6 oz (56.518 kg)    Elderly woman, appears comfortable at rest.  HEENT: Conjunctiva and lids are normal, oropharynx clear.   Neck: Supple, no elevated jugular venous pressure, no carotid bruits. No thyromegaly.  Lungs: Diminished breath sounds, no rales, nonlabored.  Cardiac: Irregularly irregular, 2/6 apical systolic murmur, no S3 gallop, no pericardial rub.  Abdomen: Soft, protuberant, bowel sounds present.  Extremities: Erythema with bilateral venous varicosities and 2+ pitting edema right worse than left, distal pulses one plus.  Neuropsychiatric: Alert oriented x3, affect appropriate.  Skin: Warm and dry.  Musculoskeletal: Kyphosis noted.   ECG: ECG is not ordered today.  Recent Labwork: 07/01/2013: ALT 15 08/13/2013: BUN 13; Creatinine 0.85; Potassium 3.6*; Sodium 138 03/16/2014: Hemoglobin 11.2*; Platelets 204   Other Studies Reviewed Today:   1) Echocardiogram  in July 2015 showed normal LV wall thickness with LVEF 60-65% and increased left atrial pressure, moderate aortic regurgitation, rheumatic mitral valve without significant stenosis but moderate mitral regurgitation, severe left atrial enlargement, mild RV enlargement, moderate to severe right atrial enlargement, PASP 45 mm mercury.  2) Endoscopy by Dr. Gala Romney in January 2016 demonstrated multiple gastric polyps, suspect early GAVE.   ASSESSMENT AND PLAN:  Atrial fibrillation Chronic, continue strategy of heart rate control and anticoagulation. No changes were made in current regimen. Follow-up in Coumadin clinic as before.   Aortic valve disease Moderate aortic regurgitation. No change on examination. Echocardiogram from July 2015 noted above.   Mitral valve disease Rheumatic mitral valve with moderate mitral regurgitation. PASP 45 mm mercury.   Leg edema Patient with known history of venous reflux and chronic edema, already underwent vascular evaluation. I have recommended that she resume her Lasix at previous dose as this had been effective, may be able to use compression stockings eventually once fluid has been reduced.  Arterial studies also ordered by podiatrist, can follow up on these as well.      Current medicines are reviewed at length with the patient today.  The patient does not have concerns regarding medicines.   Orders Placed This Encounter  Procedures  . Basic Metabolic Panel (BMET)    Disposition: FU with me in 3 months.   Signed, [name], MD  03/25/2014 9:41 AM    [facility] [address], [place], [place] [address] Phone: [phone]; Fax: [phone]

## 2014-03-25 NOTE — Assessment & Plan Note (Signed)
Patient with known history of venous reflux and chronic edema, already underwent vascular evaluation. I have recommended that she resume her Lasix at previous dose as this had been effective, may be able to use compression stockings eventually once fluid has been reduced. Arterial studies also ordered by podiatrist, can follow up on these as well.

## 2014-03-25 NOTE — Assessment & Plan Note (Signed)
Moderate aortic regurgitation. No change on examination. Echocardiogram from July 2015 noted above.

## 2014-03-25 NOTE — Assessment & Plan Note (Signed)
Rheumatic mitral valve with moderate mitral regurgitation. PASP 45 mm mercury.

## 2014-03-25 NOTE — Progress Notes (Signed)
U/S ON RECALL LIST

## 2014-03-25 NOTE — Assessment & Plan Note (Signed)
Chronic, continue strategy of heart rate control and anticoagulation. No changes were made in current regimen. Follow-up in Coumadin clinic as before.

## 2014-03-26 ENCOUNTER — Other Ambulatory Visit: Payer: Self-pay

## 2014-03-26 ENCOUNTER — Other Ambulatory Visit: Payer: Self-pay | Admitting: Internal Medicine

## 2014-03-26 DIAGNOSIS — K921 Melena: Secondary | ICD-10-CM

## 2014-03-27 ENCOUNTER — Ambulatory Visit (HOSPITAL_COMMUNITY)
Admission: RE | Admit: 2014-03-27 | Discharge: 2014-03-27 | Disposition: A | Discharge status: Home / Self Care | Payer: Medicare Other | Source: Ambulatory Visit | Attending: Podiatry | Admitting: Podiatry

## 2014-03-27 DIAGNOSIS — I739 Peripheral vascular disease, unspecified: Secondary | ICD-10-CM

## 2014-03-27 DIAGNOSIS — I10 Essential (primary) hypertension: Secondary | ICD-10-CM | POA: Diagnosis not present

## 2014-03-27 DIAGNOSIS — M79604 Pain in right leg: Secondary | ICD-10-CM | POA: Insufficient documentation

## 2014-03-27 DIAGNOSIS — M79605 Pain in left leg: Secondary | ICD-10-CM | POA: Diagnosis not present

## 2014-03-27 DIAGNOSIS — M79606 Pain in leg, unspecified: Secondary | ICD-10-CM | POA: Diagnosis present

## 2014-04-04 LAB — HEMOGLOBIN AND HEMATOCRIT, BLOOD
HCT: 43.9 % (ref 36.0–46.0)
HEMOGLOBIN: 14.9 g/dL (ref 12.0–15.0)

## 2014-04-08 ENCOUNTER — Encounter: Payer: Self-pay | Admitting: Gastroenterology

## 2014-04-08 ENCOUNTER — Ambulatory Visit (INDEPENDENT_AMBULATORY_CARE_PROVIDER_SITE_OTHER): Payer: Medicare Other | Admitting: Gastroenterology

## 2014-04-08 VITALS — BP 132/63 | HR 75 | Temp 96.9°F | Ht 61.0 in | Wt 120.6 lb

## 2014-04-08 DIAGNOSIS — R634 Abnormal weight loss: Secondary | ICD-10-CM | POA: Insufficient documentation

## 2014-04-08 DIAGNOSIS — K3189 Other diseases of stomach and duodenum: Secondary | ICD-10-CM

## 2014-04-08 NOTE — Assessment & Plan Note (Signed)
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 No capsule study indicated. Hgb normal at 14.9. Continue Nexium daily. Current fibrosis score F2/F3; repeat elastography in 2 years. Return in 6 weeks.

## 2014-04-08 NOTE — Patient Instructions (Signed)
Continue Nexium once daily.   Continue the probiotic and IBgard daily.   I would like to see you in 6 weeks for a weight check.

## 2014-04-08 NOTE — Assessment & Plan Note (Signed)
Likely multifactorial in the setting of anxiety; imaging on file and mesenteric ischemia ruled out last year. EGD and colonoscopy up-to-date. Return in 6 weeks for weight check. Further work-up as appropriate.

## 2014-04-08 NOTE — Progress Notes (Signed)
Referring Provider: Redmond School, MD Primary Care Physician:  [name], MD  Primary GI: Dr. Gala Romney   Chief Complaint  Patient presents with  . Follow-up    HPI:   [name] is a 81 y.o. female presenting today with a history of abdominal pain, constipation, nausea, dysphagia. April 2015 CTA without evidence of significant occlusive disease, performed secondary to abdominal pain, weight loss. She has steadily lost weight over the past year, normally ranging in upper 130s/140s but now 120. Recent EGD revealed possible early GAVE, hyperplastic gastric polyps. Likely oozing from stomach in the setting of Coumadin. [place] with elastography performed to evaluate for underlying liver disease, with Metavir fibrosis score of F2/F3. Needs repeat elastography in 2 years.   Multiple non-GI complaints. Poor circulation right leg. Has appt with podiatrist today due to toenail fungus. States her heart is getting worse now. Bladder bothering her. States always has an infection "somewhere". Ate some oyster stew and had diarrhea all weekend. Had to take Imodium, with resolution of diarrhea. Complains of vague abdominal pain. Has gone to the urologist due to urinary issues. States her abdomen feels "less swollen" when taking the "fluid pills". Poor appetite. Eats a lot of yogurt, ensure. Anxious.   Taking Chelsea and IBgard. Doesn't want to go to pain management. Unable to tolerate Amitiza or Linzess. Overdue for mammogram (history of breast cancer), last mammogram in June 2014.   Past Medical History  Diagnosis Date  . Rheumatic heart disease   . IBS (irritable bowel syndrome)   . GERD (gastroesophageal reflux disease)   . Breast cancer   . Mitral regurgitation   . Mitral stenosis   . Atrial fibrillation   . PAT (paroxysmal atrial tachycardia)   . Hypothyroidism   . Melanoma of neck   . Microscopic colitis 2003    Was maintained on Asacol BID  . S/P colonoscopy April 2010    Friable anal  canal, left-sided diverticula, adenomatous polyp  . S/P endoscopy April 2010    Benign polyp, s/p 54-F Maloney dilation  . Hiatal hernia   . Diverticula of colon   . Tubulovillous adenoma   . Adenomatous polyp   . Sleep apnea     CPAP at night  . History of cardiac catheterization     Normal coronaries in 2006    Past Surgical History  Procedure Laterality Date  . Abdominal hysterectomy    . Melanoma removal      SURGERY NECK  . Cataracts    . Hemorrhoid surgery    . Tonsillectomy    . Colonoscopy  06/12/2008    Dr. Laurine Blazer anal canal, left-sided diverticula, adenomatous polyp  . Esophagogastroduodenoscopy  06/12/2008    Dr. Marice Potter polyp, s/p 54-F Venia Minks dilation  . Colonoscopy  09/28/2011    Dr. Gala Romney- tubular adenoma, colonic diverticulosis  . Cholecystectomy    . Mastectomy      RIGHT BREAST, followed by left breast after radiation  . Esophagogastroduodenoscopy (egd) with esophageal dilation N/A 02/28/2013    Dr. Rourk:Mild changes of reflux esophagitis. Status post passage of a Maloney dilator. Hiatal hernia. Gastric erosions and gastric polyps-, benign fundic gland polyp  . Esophagogastroduodenoscopy N/A 03/16/2014    Dr. Gala Romney: Suspect early GAVE. No known underlying liver diease. No NSAIDS reported. Patient could easlily be bleeding from her stomach. However. hemoglobin has been normal. A capsule not indicated at this time. Status post biopsies of gastric polyps, hyperplastic.     Current Outpatient Prescriptions  Medication Sig Dispense Refill  . ALPRAZolam (XANAX) 0.5 MG tablet Take 0.5 mg by mouth at bedtime as needed. Sleep    . CALCIUM PO Take 1 tablet by mouth daily.    . Cholecalciferol (VITAMIN D-3 PO) Take 1 tablet by mouth daily.    Marland Kitchen diltiazem (CARDIZEM CD) 300 MG 24 hr capsule Take 1 capsule (300 mg total) by mouth daily. 90 capsule 3  . esomeprazole (NEXIUM) 40 MG capsule Take 1 capsule (40 mg total) by mouth daily before breakfast. 30 capsule 11    . estradiol (ESTRACE) 0.1 MG/GM vaginal cream Place 1 Applicatorful vaginally at bedtime. As needed    . furosemide (LASIX) 20 MG tablet Take 20 mg by mouth 2 (two) times daily.    Marland Kitchen HYDROcodone-acetaminophen (NORCO) 10-325 MG per tablet Take 1 tablet by mouth every 6 (six) hours as needed for moderate pain.     . hydrocortisone (CORTEF) 10 MG tablet Take 10 mg by mouth 2 (two) times daily.     . hydrocortisone cream 1 % Apply 1 application topically daily as needed for itching.    . levothyroxine (SYNTHROID, LEVOTHROID) 125 MCG tablet Take 125 mcg by mouth daily.    . mirabegron ER (MYRBETRIQ) 25 MG TB24 tablet Take 25 mg by mouth daily.    . ondansetron (ZOFRAN) 4 MG tablet Take 1 tablet (4 mg total) by mouth every 8 (eight) hours as needed for nausea or vomiting. 30 tablet 1  . Probiotic Product (ALIGN PO) Take 1 tablet by mouth daily.    . propranolol ER (INDERAL LA) 80 MG 24 hr capsule Take 80 mg by mouth daily.    Marland Kitchen spironolactone (ALDACTONE) 25 MG tablet Take 1 tablet (25 mg total) by mouth daily. 90 tablet 3  . traMADol (ULTRAM) 50 MG tablet Take 50 mg by mouth as needed for moderate pain.    Marland Kitchen warfarin (COUMADIN) 2.5 MG tablet Take 1/2 tablet daily except 1 tablet on Mondays and Fridays or as directed (Patient taking differently: Take 2.5 mg by mouth See admin instructions. Take 1/2 tablet daily except 1 tablet on Mondays and Fridays or as directed) 30 tablet 6   No current facility-administered medications for this visit.    Allergies as of 04/08/2014 - Review Complete 04/08/2014  Allergen Reaction Noted  . Amitiza [lubiprostone] Swelling 01/10/2012  . Ciprofloxacin Nausea And Vomiting 06/07/2011  . Clindamycin/lincomycin  12/30/2012  . Doxycycline  12/30/2012  . Erwinaze [asparaginase erwinia]  08/13/2013  . Linzess [linaclotide] Swelling and Rash 02/27/2012  . Sulfonamide derivatives Nausea And Vomiting and Rash 10/23/2007    Family History  Problem Relation Age of Onset   . Stroke Mother   . Heart attack Father   . Breast cancer Sister   . Colon cancer Neg Hx     History   Social History  . Marital Status: Single    Spouse Name: N/A  . Number of Children: N/A  . Years of Education: N/A   Occupational History  . RETIRED    Social History Main Topics  . Smoking status: Never Smoker   . Smokeless tobacco: Never Used  . Alcohol Use: No  . Drug Use: No  . Sexual Activity: No   Other Topics Concern  . None   Social History Narrative    Review of Systems: As mentioned in HPI  Physical Exam: BP 132/63 mmHg  Pulse 75  Temp(Src) 96.9 F (36.1 C)  Ht 5\' 1"  (1.549 m)  Wt 120  lb 9.6 oz (54.704 kg)  BMI 22.80 kg/m2 General:   Alert and oriented. No distress noted. Mildly anxious, baseline for patient.  Head:  Normocephalic and atraumatic. Eyes:  Conjuctiva clear without scleral icterus. Abdomen:  +BS, soft, non-tender and non-distended. No rebound or guarding. No HSM or masses noted. Query small umbilical hernia Msk:  kyphosis Extremities:  Without edema. Neurologic:  Alert and  oriented x4;  grossly normal neurologically. Skin:  Intact without significant lesions or rashes. Psych:  Alert and cooperative. Normal mood and affect.  Lab Results  Component Value Date   WBC 5.7 03/16/2014   HGB 14.9 04/03/2014   HCT 43.9 04/03/2014   MCV 95.2 03/16/2014   PLT 204 03/16/2014

## 2014-04-09 ENCOUNTER — Telehealth: Payer: Self-pay | Admitting: *Deleted

## 2014-04-09 NOTE — Telephone Encounter (Signed)
Was off coumadin 2 days (2/16, 2/17) to have toe nail removed.  Wants to know how to resume coumadin.  Was also started on Cipro 500mg  bid x 3 days today for UTI.  Told pt to take 2.5mg  tonight and tomorrow night then resume 1.25mg  daily except 2.5mg  on Mondays and Fridays.  She would not make f/u INR appt today.  States she will call back next week to make appt ASAP.

## 2014-04-11 NOTE — Progress Notes (Signed)
CC'ED TO PCP 

## 2014-05-21 ENCOUNTER — Ambulatory Visit: Payer: Medicare Other | Admitting: Gastroenterology

## 2014-05-27 ENCOUNTER — Other Ambulatory Visit: Payer: Self-pay | Admitting: Gastroenterology

## 2014-06-22 ENCOUNTER — Encounter: Payer: Self-pay | Admitting: Internal Medicine

## 2014-06-30 LAB — BASIC METABOLIC PANEL
BUN: 26 mg/dL — AB (ref 6–23)
CALCIUM: 9.6 mg/dL (ref 8.4–10.5)
CO2: 30 mEq/L (ref 19–32)
Chloride: 88 mEq/L — ABNORMAL LOW (ref 96–112)
Creat: 1.24 mg/dL — ABNORMAL HIGH (ref 0.50–1.10)
GLUCOSE: 88 mg/dL (ref 70–99)
Potassium: 4.2 mEq/L (ref 3.5–5.3)
SODIUM: 131 meq/L — AB (ref 135–145)

## 2014-07-06 ENCOUNTER — Ambulatory Visit (INDEPENDENT_AMBULATORY_CARE_PROVIDER_SITE_OTHER): Payer: Medicare Other | Admitting: *Deleted

## 2014-07-06 ENCOUNTER — Ambulatory Visit (INDEPENDENT_AMBULATORY_CARE_PROVIDER_SITE_OTHER): Payer: Medicare Other | Admitting: Cardiology

## 2014-07-06 ENCOUNTER — Encounter: Payer: Self-pay | Admitting: Cardiology

## 2014-07-06 VITALS — BP 130/74 | HR 72 | Ht 61.0 in | Wt 122.8 lb

## 2014-07-06 DIAGNOSIS — I482 Chronic atrial fibrillation, unspecified: Secondary | ICD-10-CM

## 2014-07-06 DIAGNOSIS — Z5181 Encounter for therapeutic drug level monitoring: Secondary | ICD-10-CM

## 2014-07-06 DIAGNOSIS — I359 Nonrheumatic aortic valve disorder, unspecified: Secondary | ICD-10-CM | POA: Diagnosis not present

## 2014-07-06 DIAGNOSIS — Z136 Encounter for screening for cardiovascular disorders: Secondary | ICD-10-CM | POA: Diagnosis not present

## 2014-07-06 DIAGNOSIS — I059 Rheumatic mitral valve disease, unspecified: Secondary | ICD-10-CM

## 2014-07-06 DIAGNOSIS — I4891 Unspecified atrial fibrillation: Secondary | ICD-10-CM | POA: Diagnosis not present

## 2014-07-06 DIAGNOSIS — R600 Localized edema: Secondary | ICD-10-CM

## 2014-07-06 LAB — POCT INR: INR: 2.4

## 2014-07-06 NOTE — Progress Notes (Signed)
Cardiology Office Note  Date: 07/06/2014   ID: [name], DOB 1929/08/29, MRN [id number]  PCP: [name]., MD  Primary Cardiologist: Cindy Lesches, MD   Chief Complaint  Patient presents with  . Atrial Fibrillation  . Valvular heart disease    History of Present Illness: [name] is an 81 y.o. female last seen in February. She presents for a routine visit. We reviewed her medications. Diuretic regimen has been stable, she remains on Aldactone, and uses Lasix 40 mg most days. She states that this provides reasonable control of her leg edema. Her weight is only a by 2 pounds compared to February.  She has not reported any palpitations or chest pain. Follow-up ECG today shows rate-controlled atrial fibrillation.  Interval lower extremity arterial studies are noted below. She has some evidence of distal left-sided disease.   Past Medical History  Diagnosis Date  . Rheumatic heart disease   . IBS (irritable bowel syndrome)   . GERD (gastroesophageal reflux disease)   . Breast cancer   . Mitral regurgitation   . Mitral stenosis   . Atrial fibrillation   . PAT (paroxysmal atrial tachycardia)   . Hypothyroidism   . Melanoma of neck   . Microscopic colitis 2003    Was maintained on Asacol BID  . S/P colonoscopy April 2010    Friable anal canal, left-sided diverticula, adenomatous polyp  . S/P endoscopy April 2010    Benign polyp, s/p 54-F Maloney dilation  . Hiatal hernia   . Diverticula of colon   . Tubulovillous adenoma   . Adenomatous polyp   . Sleep apnea     CPAP at night  . History of cardiac catheterization     Normal coronaries in 2006    Current Outpatient Prescriptions  Medication Sig Dispense Refill  . ALPRAZolam (XANAX) 0.5 MG tablet Take 0.5 mg by mouth at bedtime as needed. Sleep    . CALCIUM PO Take 1 tablet by mouth daily.    . Cholecalciferol (VITAMIN D-3 PO) Take 1 tablet by mouth daily.    Marland Kitchen diltiazem (CARDIZEM CD) 300 MG 24 hr  capsule Take 1 capsule (300 mg total) by mouth daily. 90 capsule 3  . estradiol (ESTRACE) 0.1 MG/GM vaginal cream Place 1 Applicatorful vaginally at bedtime. As needed    . furosemide (LASIX) 20 MG tablet Take 20 mg by mouth 2 (two) times daily.    Marland Kitchen HYDROcodone-acetaminophen (NORCO) 10-325 MG per tablet Take 1 tablet by mouth every 6 (six) hours as needed for moderate pain.     . hydrocortisone (CORTEF) 10 MG tablet Take 10 mg by mouth 2 (two) times daily.     . hydrocortisone cream 1 % Apply 1 application topically daily as needed for itching.    . levothyroxine (SYNTHROID, LEVOTHROID) 125 MCG tablet Take 125 mcg by mouth daily.    . mirabegron ER (MYRBETRIQ) 25 MG TB24 tablet Take 25 mg by mouth daily.    Marland Kitchen NEXIUM 40 MG capsule TAKE ONE CAPSULE BY MOUTH DAILY BEFORE BREAKFAST. 30 capsule 11  . ondansetron (ZOFRAN) 4 MG tablet Take 1 tablet (4 mg total) by mouth every 8 (eight) hours as needed for nausea or vomiting. 30 tablet 1  . Probiotic Product (ALIGN PO) Take 1 tablet by mouth daily.    . propranolol ER (INDERAL LA) 80 MG 24 hr capsule Take 80 mg by mouth daily.    Marland Kitchen spironolactone (ALDACTONE) 25 MG tablet Take 1 tablet (25  mg total) by mouth daily. 90 tablet 3  . traMADol (ULTRAM) 50 MG tablet Take 50 mg by mouth as needed for moderate pain.    Marland Kitchen warfarin (COUMADIN) 2.5 MG tablet Take 1/2 tablet daily except 1 tablet on Mondays and Fridays or as directed (Patient taking differently: Take 2.5 mg by mouth See admin instructions. Take 1/2 tablet daily except 1 tablet on Mondays and Fridays or as directed) 30 tablet 6   No current facility-administered medications for this visit.    Allergies:  Amitiza; Ciprofloxacin; Clindamycin/lincomycin; Doxycycline; Erwinaze; Linzess; and Sulfonamide derivatives   Social History: The patient  reports that she has never smoked. She has never used smokeless tobacco. She reports that she does not drink alcohol or use illicit drugs.   ROS:  Please see  the history of present illness. Otherwise, complete review of systems is positive for fungal nail changes on her feet.  All other systems are reviewed and negative.   Physical Exam: VS:  BP 130/74 mmHg  Pulse 72  Ht 5\' 1"  (1.549 m)  Wt 122 lb 12.8 oz (55.702 kg)  BMI 23.21 kg/m2  SpO2 97%, BMI Body mass index is 23.21 kg/(m^2).  Wt Readings from Last 3 Encounters:  07/06/14 122 lb 12.8 oz (55.702 kg)  04/08/14 120 lb 9.6 oz (54.704 kg)  03/25/14 119 lb (53.978 kg)     Elderly woman, appears comfortable at rest.  HEENT: Conjunctiva and lids are normal, oropharynx clear.  Neck: Supple, no elevated jugular venous pressure, no carotid bruits. No thyromegaly.  Lungs: Diminished breath sounds, no rales, nonlabored.  Cardiac: Irregularly irregular, 2/6 apical systolic murmur, no S3 gallop, no pericardial rub.  Abdomen: Soft, protuberant, bowel sounds present.  Extremities: Erythema with bilateral venous varicosities and 1+ edema, distal pulses one plus.  Neuropsychiatric: Alert oriented x3, affect appropriate.  Skin: Warm and dry.  Musculoskeletal: Kyphosis noted.   ECG: ECG is ordered today and shows rate-controlled atrial fibrillation with low voltage.  Recent Labwork: 03/16/2014: Platelets 204 04/03/2014: Hemoglobin 14.9 06/29/2014: BUN 26*; Creatinine 1.24*; Potassium 4.2; Sodium 131*   Other Studies Reviewed Today:  1) Echocardiogram in July 2015 showed normal LV wall thickness with LVEF 60-65% and increased left atrial pressure, moderate aortic regurgitation, rheumatic mitral valve without significant stenosis but moderate mitral regurgitation, severe left atrial enlargement, mild RV enlargement, moderate to severe right atrial enlargement, PASP 45 mm mercury.  2) Lower extremity arterial studies 03/27/2014: FINDINGS: Right:  Resting ankle brachial index: 1.08  Segmental blood pressure: Relatively symmetric segmental blood pressures on the right compared to the  left, though there is slight decreased blood pressure throughout segments on the right, potentially artifactual.  Doppler: Triphasic Doppler signal the right common femoral artery and superficial femoral artery. Biphasic signal of the popliteal artery, posterior tibial artery and dorsalis pedis.  Pulse volume recording: Segmental pulse volume recording demonstrates loss of augmentation from high thigh to below knee. Decreased in the quality of the waveform at the metatarsal segment and at the right great toe.  Waveforms throughout the digits maintained. Asymmetric compared to the left, with decreased amplitude.  Left:  Resting ankle brachial index: 1.20  Segmental blood pressure: Relatively symmetric segmental blood pressures on the left compared to the right. The left-sided pressures are slightly greater throughout all segments, potentially artifactual.  Doppler: Triphasic signal on the left including the left common femoral artery and superficial femoral artery, with biphasic signal of the popliteal artery, posterior tibial artery, and dorsalis pedis.  Pulse  volume recording: Augmentation is maintained on the left from high thigh to below knee. There is deterioration the quality of the waveform at the metatarsal segment and the left great toe.  Waveforms throughout the left-sided digits maintained.  IMPRESSION: Right:  Resting ankle-brachial index demonstrates no evidence of arterial occlusive disease. This may drop further with exercise.  The PVR and Doppler signals at the popliteal artery and below knee segments demonstrate evidence of atherosclerotic disease affecting the distal femoral popliteal segment and likely the tibial arteries.  Left:  Resting ABI demonstrates no evidence of arterial occlusive disease. This may drop further with exercise.  PVR and Doppler signals of the left lower extremity suggest atherosclerosis of the tibial  vessels.   ASSESSMENT AND PLAN:  1. Chronic atrial fibrillation. Continue strategy of heart rate control and anticoagulation. He follows in the anticoagulation clinic, INR 2.4 today.  2. Valvular heart disease including moderate aortic regurgitation and mitral regurgitation. Continue observation for now.  3. Leg edema, stable control on current diuretic regimen.  Current medicines were reviewed at length with the patient today.   Orders Placed This Encounter  Procedures  . EKG 12-Lead    Disposition: FU with me in 4 months.   Signed, Satira Sark, MD, [facility] 07/06/2014 9:23 AM    [facility] [place]. [address] [place], [place] [address] Phone: [phone]; Fax: [phone]

## 2014-07-06 NOTE — Patient Instructions (Signed)
Your physician wants you to follow-up in: 4 months with Dr Domenic Polite  Your physician recommends that you continue on your current medications as directed. Please refer to the Current Medication list given to you today.     Thank you for choosing [facility] !

## 2014-08-19 ENCOUNTER — Encounter: Payer: Self-pay | Admitting: Gastroenterology

## 2014-08-19 ENCOUNTER — Ambulatory Visit (INDEPENDENT_AMBULATORY_CARE_PROVIDER_SITE_OTHER): Payer: Medicare Other | Admitting: Gastroenterology

## 2014-08-19 VITALS — BP 111/60 | HR 66 | Temp 98.0°F | Ht 61.0 in | Wt 118.8 lb

## 2014-08-19 DIAGNOSIS — Z853 Personal history of malignant neoplasm of breast: Secondary | ICD-10-CM | POA: Insufficient documentation

## 2014-08-19 DIAGNOSIS — R634 Abnormal weight loss: Secondary | ICD-10-CM

## 2014-08-19 DIAGNOSIS — R1030 Lower abdominal pain, unspecified: Secondary | ICD-10-CM | POA: Diagnosis not present

## 2014-08-19 MED ORDER — ONDANSETRON HCL 4 MG PO TABS: 4.0000 mg | ORAL_TABLET | Freq: Three times a day (TID) | ORAL | Status: AC | PRN

## 2014-08-19 NOTE — Patient Instructions (Signed)
I have refilled your Zofran (nausea medication).  I have ordered blood work, a CT scan, and a mammogram to all be done at the same time at the hospital.  Further recommendations to follow!

## 2014-08-19 NOTE — Progress Notes (Signed)
Referring Provider: Redmond School, MD Primary Care Physician:  [name], MD  Primary GI: Dr. Gala Romney   Chief Complaint  Patient presents with  . Abdominal Pain    HPI:   [name] is an 81 y.o. female presenting today with a history of  abdominal pain, constipation, nausea, dysphagia. April 2015 CTA without evidence of significant occlusive disease, performed secondary to abdominal pain, weight loss. She has steadily lost weight over the past year, normally ranging in upper 130s/140s but now 118. She has lost even a few more pounds since last visit. Recent EGD revealed possible early GAVE, hyperplastic gastric polyps. Likely oozing from stomach in the setting of Coumadin. [place] with elastography performed to evaluate for underlying liver disease, with Metavir fibrosis score of F2/F3. Needs repeat elastography in 2 years. Chronic constipation, unable to tolerate Amitiza or Linzess.   Here for weight check today. 118. Unable to tolerate Miralax. States it gags her. Knife-like pain across lower abdomen. IBgard and Align helps some. Eats ensure and yogurt. Has chronic lower extremity edema. Multiple non-GI complaints. Feels like her abdomen is swelling. Last colonoscopy Aug 2013 with tubular adenoma.   Past Medical History  Diagnosis Date  . Rheumatic heart disease   . IBS (irritable bowel syndrome)   . GERD (gastroesophageal reflux disease)   . Breast cancer   . Mitral regurgitation   . Mitral stenosis   . Atrial fibrillation   . PAT (paroxysmal atrial tachycardia)   . Hypothyroidism   . Melanoma of neck   . Microscopic colitis 2003    Was maintained on Asacol BID  . S/P colonoscopy April 2010    Friable anal canal, left-sided diverticula, adenomatous polyp  . S/P endoscopy April 2010    Benign polyp, s/p 54-F Maloney dilation  . Hiatal hernia   . Diverticula of colon   . Tubulovillous adenoma   . Adenomatous polyp   . Sleep apnea     CPAP at night  . History of  cardiac catheterization     Normal coronaries in 2006    Past Surgical History  Procedure Laterality Date  . Abdominal hysterectomy    . Melanoma removal      SURGERY NECK  . Cataracts    . Hemorrhoid surgery    . Tonsillectomy    . Colonoscopy  06/12/2008    Dr. Laurine Blazer anal canal, left-sided diverticula, adenomatous polyp  . Esophagogastroduodenoscopy  06/12/2008    Dr. Marice Potter polyp, s/p 54-F Venia Minks dilation  . Colonoscopy  09/28/2011    Dr. Gala Romney- tubular adenoma, colonic diverticulosis  . Cholecystectomy    . Mastectomy      RIGHT BREAST, followed by left breast after radiation  . Esophagogastroduodenoscopy (egd) with esophageal dilation N/A 02/28/2013    Dr. Rourk:Mild changes of reflux esophagitis. Status post passage of a Maloney dilator. Hiatal hernia. Gastric erosions and gastric polyps-, benign fundic gland polyp  . Esophagogastroduodenoscopy N/A 03/16/2014    Dr. Gala Romney: Suspect early GAVE. No known underlying liver diease. No NSAIDS reported. Patient could easlily be bleeding from her stomach. However. hemoglobin has been normal. A capsule not indicated at this time. Status post biopsies of gastric polyps, hyperplastic.     Current Outpatient Prescriptions  Medication Sig Dispense Refill  . ALPRAZolam (XANAX) 0.5 MG tablet Take 0.5 mg by mouth at bedtime as needed. Sleep    . CALCIUM PO Take 1 tablet by mouth daily.    . Cholecalciferol (VITAMIN D-3 PO) Take 1  tablet by mouth daily.    Marland Kitchen diltiazem (CARDIZEM CD) 300 MG 24 hr capsule Take 1 capsule (300 mg total) by mouth daily. 90 capsule 3  . estradiol (ESTRACE) 0.1 MG/GM vaginal cream Place 1 Applicatorful vaginally at bedtime. As needed    . furosemide (LASIX) 20 MG tablet Take 20 mg by mouth 2 (two) times daily.    Marland Kitchen HYDROcodone-acetaminophen (NORCO) 10-325 MG per tablet Take 1 tablet by mouth every 6 (six) hours as needed for moderate pain.     . hydrocortisone (CORTEF) 10 MG tablet Take 10 mg by mouth 2 (two)  times daily.     . hydrocortisone cream 1 % Apply 1 application topically daily as needed for itching.    . levothyroxine (SYNTHROID, LEVOTHROID) 125 MCG tablet Take 125 mcg by mouth daily.    . mirabegron ER (MYRBETRIQ) 25 MG TB24 tablet Take 25 mg by mouth daily.    Marland Kitchen NEXIUM 40 MG capsule TAKE ONE CAPSULE BY MOUTH DAILY BEFORE BREAKFAST. 30 capsule 11  . ondansetron (ZOFRAN) 4 MG tablet Take 1 tablet (4 mg total) by mouth every 8 (eight) hours as needed for nausea or vomiting. 30 tablet 1  . Probiotic Product (ALIGN PO) Take 1 tablet by mouth daily.    . propranolol ER (INDERAL LA) 80 MG 24 hr capsule Take 80 mg by mouth daily.    Marland Kitchen spironolactone (ALDACTONE) 25 MG tablet Take 1 tablet (25 mg total) by mouth daily. 90 tablet 3  . traMADol (ULTRAM) 50 MG tablet Take 50 mg by mouth as needed for moderate pain.    Marland Kitchen UNABLE TO FIND 4 (four) times daily as needed. Diclofenac 3%/Lidocaine 5% /Methol 1%    . warfarin (COUMADIN) 2.5 MG tablet Take 1/2 tablet daily except 1 tablet on Mondays and Fridays or as directed (Patient taking differently: Take 2.5 mg by mouth See admin instructions. Take 1/2 tablet daily except 1 tablet on Mondays and Fridays or as directed) 30 tablet 6   No current facility-administered medications for this visit.    Allergies as of 08/19/2014 - Review Complete 07/06/2014  Allergen Reaction Noted  . Amitiza [lubiprostone] Swelling 01/10/2012  . Ciprofloxacin Nausea And Vomiting 06/07/2011  . Clindamycin/lincomycin  12/30/2012  . Doxycycline  12/30/2012  . Erwinaze [asparaginase erwinia]  08/13/2013  . Linzess [linaclotide] Swelling and Rash 02/27/2012  . Sulfonamide derivatives Nausea And Vomiting and Rash 10/23/2007    Family History  Problem Relation Age of Onset  . Stroke Mother   . Heart attack Father   . Breast cancer Sister   . Colon cancer Neg Hx     History   Social History  . Marital Status: Single    Spouse Name: N/A  . Number of Children: N/A  .  Years of Education: N/A   Occupational History  . RETIRED    Social History Main Topics  . Smoking status: Never Smoker   . Smokeless tobacco: Never Used  . Alcohol Use: No  . Drug Use: No  . Sexual Activity: No   Other Topics Concern  . None   Social History Narrative    Review of Systems: As mentioned in HPI  Physical Exam: BP 111/60 mmHg  Pulse 66  Temp(Src) 98 F (36.7 C) (Oral)  Ht 5\' 1"  (1.549 m)  Wt 118 lb 12.8 oz (53.887 kg)  BMI 22.46 kg/m2 General:   Alert and oriented. No distress noted. Pleasant and cooperative.  Head:  Normocephalic and atraumatic. Eyes:  Conjuctiva clear without scleral icterus. Mouth:  Oral mucosa pink and moist.  Abd: TTP lower abdomen. No obvious mass or HSM. +BS, no rebound or  guarding Msk:  Kyphosis  Extremities:  Bilateral lower extremity edema with chronic venous stasis changes.  Neurologic:  Alert and  oriented x4;  grossly normal neurologically. Psych:  Alert and cooperative. Normal mood and affect.

## 2014-08-23 NOTE — Assessment & Plan Note (Signed)
Persistent weight loss concerning. EGD up-to-date. Last colonoscopy 2013. No concerning lower GI symptoms. Check fasting cortisol level, TSH. CT as planned. Mammogram due to history of breast cancer (overdue for surveillance).

## 2014-08-23 NOTE — Assessment & Plan Note (Signed)
81 year old female with chronic constipation, chronic abdominal pain, and CTA on file negative for mesenteric ischemia. Question adhesive disease as a contributor. However, her persistent weight loss is concerning. Last colonoscopy in 2013 with tubular adenoma, EGD up-to-date. Check repeat CT abd/pelvis now to evaluate for occult malignancy. Continue probiotic and IBgard. PPI daily.

## 2014-08-25 ENCOUNTER — Other Ambulatory Visit: Payer: Self-pay

## 2014-08-25 ENCOUNTER — Other Ambulatory Visit: Payer: Self-pay | Admitting: Gastroenterology

## 2014-08-25 DIAGNOSIS — Z1231 Encounter for screening mammogram for malignant neoplasm of breast: Secondary | ICD-10-CM

## 2014-08-25 NOTE — Progress Notes (Signed)
cc'ed to pcp °

## 2014-08-26 ENCOUNTER — Ambulatory Visit (HOSPITAL_COMMUNITY): Payer: Medicare Other

## 2014-08-27 ENCOUNTER — Other Ambulatory Visit: Payer: Self-pay | Admitting: Cardiology

## 2014-08-28 ENCOUNTER — Other Ambulatory Visit (HOSPITAL_COMMUNITY): Payer: Medicare Other

## 2014-08-28 LAB — BASIC METABOLIC PANEL
BUN: 30 mg/dL — AB (ref 6–23)
CALCIUM: 9.4 mg/dL (ref 8.4–10.5)
CHLORIDE: 91 meq/L — AB (ref 96–112)
CO2: 28 mEq/L (ref 19–32)
Creat: 1.54 mg/dL — ABNORMAL HIGH (ref 0.50–1.10)
GLUCOSE: 79 mg/dL (ref 70–99)
Potassium: 4.4 mEq/L (ref 3.5–5.3)
Sodium: 134 mEq/L — ABNORMAL LOW (ref 135–145)

## 2014-08-28 LAB — TSH: TSH: 0.261 u[IU]/mL — ABNORMAL LOW (ref 0.350–4.500)

## 2014-08-28 LAB — CORTISOL-AM, BLOOD: CORTISOL - AM: 8.6 ug/dL (ref 4.3–22.4)

## 2014-08-31 ENCOUNTER — Other Ambulatory Visit: Payer: Self-pay | Admitting: Gastroenterology

## 2014-08-31 ENCOUNTER — Ambulatory Visit (HOSPITAL_COMMUNITY)
Admission: RE | Admit: 2014-08-31 | Discharge: 2014-08-31 | Disposition: A | Discharge status: Home / Self Care | Payer: Medicare Other | Source: Ambulatory Visit | Attending: Gastroenterology | Admitting: Gastroenterology

## 2014-08-31 ENCOUNTER — Encounter (HOSPITAL_COMMUNITY): Payer: Self-pay

## 2014-08-31 DIAGNOSIS — R634 Abnormal weight loss: Secondary | ICD-10-CM

## 2014-08-31 DIAGNOSIS — R1030 Lower abdominal pain, unspecified: Secondary | ICD-10-CM | POA: Diagnosis not present

## 2014-08-31 DIAGNOSIS — Z1231 Encounter for screening mammogram for malignant neoplasm of breast: Secondary | ICD-10-CM | POA: Insufficient documentation

## 2014-08-31 DIAGNOSIS — R932 Abnormal findings on diagnostic imaging of liver and biliary tract: Secondary | ICD-10-CM | POA: Diagnosis not present

## 2014-08-31 LAB — POCT I-STAT CREATININE: CREATININE: 1.3 mg/dL — AB (ref 0.44–1.00)

## 2014-08-31 MED ORDER — IOHEXOL 300 MG/ML  SOLN
50.0000 mL | Freq: Once | INTRAMUSCULAR | Status: AC | PRN
  Administered 2014-08-31: 50 mL via INTRAVENOUS

## 2014-08-31 MED ORDER — SODIUM CHLORIDE 0.9 % IJ SOLN
INTRAMUSCULAR | Status: AC
  Filled 2014-08-31: qty 750

## 2014-08-31 MED ORDER — SODIUM CHLORIDE 0.9 % IJ SOLN
INTRAMUSCULAR | Status: AC
  Filled 2014-08-31: qty 60

## 2014-09-02 NOTE — Progress Notes (Signed)
Quick Note:  TSH just mildly low at 0.261. Please have this copied to PCP. Cortisol normal. Creatinine is higher than previously. Please send to PCP.  CT has a few findings that are concerning. There is a small amount of free fluid in pelvis around distal sigmoid colon of unknown etiology. She has chronic but PROGRESSIVE biliary dilatation. We need an HFP as soon as possible. I anticipate an MRCP thereafter, but I need to review the labs first.   ______

## 2014-09-02 NOTE — Progress Notes (Signed)
Quick Note:    Normal mammogram  ______

## 2014-09-03 ENCOUNTER — Other Ambulatory Visit: Payer: Self-pay | Admitting: Gastroenterology

## 2014-09-03 ENCOUNTER — Other Ambulatory Visit: Payer: Self-pay

## 2014-09-03 DIAGNOSIS — R109 Unspecified abdominal pain: Secondary | ICD-10-CM

## 2014-09-04 LAB — HEPATIC FUNCTION PANEL
ALBUMIN: 4 g/dL (ref 3.5–5.2)
ALK PHOS: 71 U/L (ref 39–117)
ALT: 16 U/L (ref 0–35)
AST: 22 U/L (ref 0–37)
BILIRUBIN DIRECT: 0.2 mg/dL (ref 0.0–0.3)
BILIRUBIN INDIRECT: 0.6 mg/dL (ref 0.2–1.2)
BILIRUBIN TOTAL: 0.8 mg/dL (ref 0.2–1.2)
TOTAL PROTEIN: 7.1 g/dL (ref 6.0–8.3)

## 2014-09-15 ENCOUNTER — Other Ambulatory Visit: Payer: Self-pay

## 2014-09-15 DIAGNOSIS — K838 Other specified diseases of biliary tract: Secondary | ICD-10-CM

## 2014-09-15 NOTE — Progress Notes (Signed)
Quick Note:  LFTs are normal.  CT showed progressive CBD dilation.  I don't see any documentation of a pacemaker or anything that would prevent an MRI/MRCP. I think she needs an MRI/MRCP to evaluate for CBD further for occult malignancy.  Follow-up with me after MRI/MRCP. ______

## 2014-09-24 ENCOUNTER — Ambulatory Visit (HOSPITAL_COMMUNITY)
Admission: RE | Admit: 2014-09-24 | Discharge: 2014-09-24 | Disposition: A | Discharge status: Home / Self Care | Payer: Medicare Other | Source: Ambulatory Visit | Attending: Gastroenterology | Admitting: Gastroenterology

## 2014-09-24 ENCOUNTER — Other Ambulatory Visit: Payer: Self-pay | Admitting: Gastroenterology

## 2014-09-24 DIAGNOSIS — N281 Cyst of kidney, acquired: Secondary | ICD-10-CM | POA: Diagnosis not present

## 2014-09-24 DIAGNOSIS — Z9049 Acquired absence of other specified parts of digestive tract: Secondary | ICD-10-CM | POA: Diagnosis not present

## 2014-09-24 DIAGNOSIS — K838 Other specified diseases of biliary tract: Secondary | ICD-10-CM | POA: Diagnosis present

## 2014-09-24 MED ORDER — GADOBENATE DIMEGLUMINE 529 MG/ML IV SOLN
5.0000 mL | Freq: Once | INTRAVENOUS | Status: AC | PRN
  Administered 2014-09-24: 5 mL via INTRAVENOUS

## 2014-09-25 NOTE — Progress Notes (Signed)
Quick Note:  Chronically dilated bile duct on MRCP.  No acute findings. She has been thoroughly evaluated for weight loss.  Please have her return for follow-up with me in next 4 weeks. ______

## 2014-10-16 ENCOUNTER — Other Ambulatory Visit (HOSPITAL_COMMUNITY): Payer: Self-pay | Admitting: Internal Medicine

## 2014-10-16 DIAGNOSIS — M8580 Other specified disorders of bone density and structure, unspecified site: Secondary | ICD-10-CM

## 2014-10-20 ENCOUNTER — Ambulatory Visit (INDEPENDENT_AMBULATORY_CARE_PROVIDER_SITE_OTHER): Payer: Medicare Other | Admitting: Internal Medicine

## 2014-10-20 ENCOUNTER — Encounter: Payer: Self-pay | Admitting: Internal Medicine

## 2014-10-20 VITALS — BP 116/61 | HR 94 | Temp 97.7°F | Ht 60.0 in | Wt 101.4 lb

## 2014-10-20 DIAGNOSIS — K5900 Constipation, unspecified: Secondary | ICD-10-CM

## 2014-10-20 DIAGNOSIS — R634 Abnormal weight loss: Secondary | ICD-10-CM

## 2014-10-20 DIAGNOSIS — K219 Gastro-esophageal reflux disease without esophagitis: Secondary | ICD-10-CM | POA: Diagnosis not present

## 2014-10-20 NOTE — Patient Instructions (Signed)
Lets get pt an appointment to see Dr. Tressie Stalker (hx breast ca, melanoma, weight loss)  Continue Nexiuim daily  IBS-constipation information  Continue Colace and Align daily for bowel function  Office visit here in 6 weeks

## 2014-10-20 NOTE — Progress Notes (Signed)
Primary Care Physician:  [name], MD Primary Gastroenterologist:  Dr. Gala Romney  Pre-Procedure History & Physical: HPI:  [name] is a 81 y.o. female here for follow-up of GERD, constipation weight loss. Weight down to 101 today. At baseline in the 130s. Nonspecific abdominal cramping from times to time; no good appetite. TSH suppressed on supplementation recently. The thyroid dose decreased. Occasional nausea and vomiting. Chronic constipation managed with Align and Colace. She is up-to-date on EGD and colonoscopy. Dilated biliary tree with normal LFTs. CT demonstrated some new per-aortic adenopathy but not to be pathological. Splenic hemangioma. Small amount of pelvic fluid. MRCP revealed dilated biliary. No stricture   Normal pancreas. Patient reminded me she has a history of melanoma and breast cancer. She is overdue to see her oncologist.  Past Medical History  Diagnosis Date  . Rheumatic heart disease   . IBS (irritable bowel syndrome)   . GERD (gastroesophageal reflux disease)   . Breast cancer   . Mitral regurgitation   . Mitral stenosis   . Atrial fibrillation   . PAT (paroxysmal atrial tachycardia)   . Hypothyroidism   . Melanoma of neck   . Microscopic colitis 2003    Was maintained on Asacol BID  . S/P colonoscopy April 2010    Friable anal canal, left-sided diverticula, adenomatous polyp  . S/P endoscopy April 2010    Benign polyp, s/p 54-F Maloney dilation  . Hiatal hernia   . Diverticula of colon   . Tubulovillous adenoma   . Adenomatous polyp   . Sleep apnea     CPAP at night  . History of cardiac catheterization     Normal coronaries in 2006    Past Surgical History  Procedure Laterality Date  . Abdominal hysterectomy    . Melanoma removal      SURGERY NECK  . Cataracts    . Hemorrhoid surgery    . Tonsillectomy    . Colonoscopy  06/12/2008    Dr. Laurine Blazer anal canal, left-sided diverticula, adenomatous polyp  .  Esophagogastroduodenoscopy  06/12/2008    Dr. Marice Potter polyp, s/p 54-F Venia Minks dilation  . Colonoscopy  09/28/2011    Dr. Gala Romney- tubular adenoma, colonic diverticulosis  . Cholecystectomy    . Mastectomy      RIGHT BREAST, followed by left breast after radiation  . Esophagogastroduodenoscopy (egd) with esophageal dilation N/A 02/28/2013    Dr. Ariyan Brisendine:Mild changes of reflux esophagitis. Status post passage of a Maloney dilator. Hiatal hernia. Gastric erosions and gastric polyps-, benign fundic gland polyp  . Esophagogastroduodenoscopy N/A 03/16/2014    Dr. Gala Romney: Suspect early GAVE. No known underlying liver diease. No NSAIDS reported. Patient could easlily be bleeding from her stomach. However. hemoglobin has been normal. A capsule not indicated at this time. Status post biopsies of gastric polyps, hyperplastic.     Prior to Admission medications   Medication Sig Start Date End Date Taking? Authorizing Provider  ALPRAZolam Duanne Moron) 0.5 MG tablet Take 0.5 mg by mouth at bedtime as needed. Sleep 06/22/11  Yes Historical Provider, MD  CALCIUM PO Take 1 tablet by mouth daily.   Yes Historical Provider, MD  Cholecalciferol (VITAMIN D-3 PO) Take 1 tablet by mouth daily.   Yes Historical Provider, MD  diltiazem (CARDIZEM CD) 300 MG 24 hr capsule Take 1 capsule (300 mg total) by mouth daily. 01/05/14  Yes Satira Sark, MD  estradiol (ESTRACE) 0.1 MG/GM vaginal cream Place 1 Applicatorful vaginally at bedtime. As needed  Yes Historical Provider, MD  furosemide (LASIX) 20 MG tablet Take 20 mg by mouth 2 (two) times daily.   Yes Historical Provider, MD  HYDROcodone-acetaminophen (NORCO) 10-325 MG per tablet Take 1 tablet by mouth every 6 (six) hours as needed for moderate pain.    Yes Historical Provider, MD  hydrocortisone (CORTEF) 10 MG tablet Take 10 mg by mouth 2 (two) times daily.    Yes Historical Provider, MD  hydrocortisone cream 1 % Apply 1 application topically daily as needed for itching.  03/01/12  Yes Nimish Luther Parody, MD  levothyroxine (SYNTHROID, LEVOTHROID) 125 MCG tablet Take 125 mcg by mouth daily.   Yes Historical Provider, MD  mirabegron ER (MYRBETRIQ) 25 MG TB24 tablet Take 25 mg by mouth daily.   Yes Historical Provider, MD  NEXIUM 40 MG capsule TAKE ONE CAPSULE BY MOUTH DAILY BEFORE BREAKFAST. 05/27/14  Yes Mahala Menghini, PA-C  ondansetron (ZOFRAN) 4 MG tablet Take 1 tablet (4 mg total) by mouth every 8 (eight) hours as needed for nausea or vomiting. 08/19/14  Yes Orvil Feil, NP  Probiotic Product (ALIGN PO) Take 1 tablet by mouth daily.   Yes Historical Provider, MD  propranolol ER (INDERAL LA) 80 MG 24 hr capsule Take 80 mg by mouth daily.   Yes Historical Provider, MD  spironolactone (ALDACTONE) 25 MG tablet TAKE 1 TABLET BY MOUTH ONCE DAILY. 08/27/14  Yes Satira Sark, MD  traMADol (ULTRAM) 50 MG tablet Take 50 mg by mouth as needed for moderate pain.   Yes Historical Provider, MD  UNABLE TO FIND 4 (four) times daily as needed. Diclofenac 3%/Lidocaine 5% /Methol 1%   Yes Historical Provider, MD  warfarin (COUMADIN) 2.5 MG tablet Take 1/2 tablet daily except 1 tablet on Mondays and Fridays or as directed Patient taking differently: Take 2.5 mg by mouth See admin instructions. Take 1/2 tablet daily except 1 tablet on Mondays and Fridays or as directed 01/05/14  Yes Satira Sark, MD    Allergies as of 10/20/2014 - Review Complete 10/20/2014  Allergen Reaction Noted  . Amitiza [lubiprostone] Swelling 01/10/2012  . Ciprofloxacin Nausea And Vomiting 06/07/2011  . Clindamycin/lincomycin  12/30/2012  . Doxycycline  12/30/2012  . Erwinaze [asparaginase erwinia]  08/13/2013  . Linzess [linaclotide] Swelling and Rash 02/27/2012  . Sulfonamide derivatives Nausea And Vomiting and Rash 10/23/2007    Family History  Problem Relation Age of Onset  . Stroke Mother   . Heart attack Father   . Breast cancer Sister   . Colon cancer Neg Hx     Social History    Social History  . Marital Status: Single    Spouse Name: N/A  . Number of Children: N/A  . Years of Education: N/A   Occupational History  . RETIRED    Social History Main Topics  . Smoking status: Never Smoker   . Smokeless tobacco: Never Used  . Alcohol Use: No  . Drug Use: No  . Sexual Activity: No   Other Topics Concern  . Not on file   Social History Narrative    Review of Systems: See HPI, otherwise negative ROS  Physical Exam: BP 116/61 mmHg  Pulse 94  Temp(Src) 97.7 F (36.5 C)  Ht 5' (1.524 m)  Wt 101 lb 6.4 oz (45.995 kg)  BMI 19.80 kg/m2 General:   Alert,  Well-developed, well-nourished, pleasant and cooperative in NAD Skin:  Intact without significant lesions or rashes. Eyes:  Sclera clear, no icterus.  Conjunctiva pink. Ears:  Normal auditory acuity. Nose:  No deformity, discharge,  or lesions. Mouth:  No deformity or lesions. Neck:  Supple; no masses or thyromegaly. No significant cervical adenopathy. Lungs:  Clear throughout to auscultation.   No wheezes, crackles, or rhonchi. No acute distress. Heart:  Regular rate and rhythm; no murmurs, clicks, rubs,  or gallops. Abdomen: Non-distended, normal bowel sounds.  Soft and nontender without appreciable mass or hepatosplenomegaly.  Pulses:  Normal pulses noted. Extremities:  Without clubbing or edema.  Impression:  Pleasant 81 year old lady with history of breast cancer and melanoma failing to thrive with progressing weight loss in the setting of recent iatrogenic hypothyroidism. Thyroid supplement has been reduced. Patient has nonspecific GI complaints of abdominal pain and constipation. Reflux symptoms well controlled. CT and MRI recently demonstrated dilation of the biliary tree without any underlying pathology found. LFTs normal. Hemangioma in the spleen. Need periaortic lymph nodes but not pathologically enlarged.  No significant mesenteric arterial stenosis to suggest intestinal angina. She is  up-to-date on the EGD and colonoscopy. She may have underlying fatty liver and some degree of hepatic fibrosis but that is not clinically relevant at this time.  She is overdue to see Dr. Tressie Stalker  Recommendation:  Lets get pt an appointment to see Dr. Tressie Stalker (hx breast ca, melanoma, weight loss)  Continue Nexiuim daily  IBS-constipation information  Continue Colace and Align daily for bowel function  Office visit here in 6 weeks          Notice: This dictation was prepared with Dragon dictation along with smaller phrase technology. Any transcriptional errors that result from this process are unintentional and may not be corrected upon review.

## 2014-10-23 ENCOUNTER — Ambulatory Visit (HOSPITAL_COMMUNITY)
Admission: RE | Admit: 2014-10-23 | Discharge: 2014-10-23 | Disposition: A | Discharge status: Home / Self Care | Payer: Medicare Other | Source: Ambulatory Visit | Attending: Internal Medicine | Admitting: Internal Medicine

## 2014-10-23 DIAGNOSIS — M810 Age-related osteoporosis without current pathological fracture: Secondary | ICD-10-CM | POA: Diagnosis not present

## 2014-10-23 DIAGNOSIS — M85851 Other specified disorders of bone density and structure, right thigh: Secondary | ICD-10-CM | POA: Insufficient documentation

## 2014-10-23 DIAGNOSIS — Z780 Asymptomatic menopausal state: Secondary | ICD-10-CM | POA: Diagnosis present

## 2014-10-23 DIAGNOSIS — M8580 Other specified disorders of bone density and structure, unspecified site: Secondary | ICD-10-CM

## 2014-11-02 ENCOUNTER — Other Ambulatory Visit (HOSPITAL_COMMUNITY): Payer: Self-pay | Admitting: Oncology

## 2014-11-02 DIAGNOSIS — Z853 Personal history of malignant neoplasm of breast: Secondary | ICD-10-CM

## 2014-11-04 ENCOUNTER — Telehealth: Payer: Self-pay | Admitting: *Deleted

## 2014-11-04 NOTE — Telephone Encounter (Signed)
Lab results received from [facility] and placed on Dr. Myles Gip desk.

## 2014-11-04 NOTE — Telephone Encounter (Signed)
-----   Message from Alvin Critchley sent at 11/03/2014  2:58 PM EDT ----- Regarding: Abnormal Labwork I just received a call from the [facility] regarding the patients labwork being abnormal.  Please direct results to Dr. Domenic Polite per their request. Thanks, Lexine Baton

## 2014-11-06 ENCOUNTER — Ambulatory Visit (INDEPENDENT_AMBULATORY_CARE_PROVIDER_SITE_OTHER): Payer: Medicare Other | Admitting: Cardiology

## 2014-11-06 ENCOUNTER — Encounter: Payer: Self-pay | Admitting: Cardiology

## 2014-11-06 VITALS — BP 112/64 | HR 78 | Ht 60.0 in | Wt 103.6 lb

## 2014-11-06 DIAGNOSIS — I351 Nonrheumatic aortic (valve) insufficiency: Secondary | ICD-10-CM

## 2014-11-06 DIAGNOSIS — R600 Localized edema: Secondary | ICD-10-CM

## 2014-11-06 DIAGNOSIS — I482 Chronic atrial fibrillation, unspecified: Secondary | ICD-10-CM

## 2014-11-06 DIAGNOSIS — I059 Rheumatic mitral valve disease, unspecified: Secondary | ICD-10-CM

## 2014-11-06 NOTE — Patient Instructions (Signed)
Your physician recommends that you schedule a follow-up appointment in: [place]  Your physician recommends that you continue on your current medications as directed. Please refer to the Current Medication list given to you today.  Your physician has requested that you have an echocardiogram. Echocardiography is a painless test that uses sound waves to create images of your heart. It provides your doctor with information about the size and shape of your heart and how well your heart's chambers and valves are working. This procedure takes approximately one hour. There are no restrictions for this procedure.  Thanks for choosing [facility]!!!

## 2014-11-06 NOTE — Progress Notes (Signed)
Cardiology Office Note  Date: 11/06/2014   ID: [name], DOB 18-Mar-1929, MRN [id number]  PCP: [name], MD  Primary Cardiologist: [name], MD   Chief Complaint  Patient presents with  . Valvular heart disease  . Atrial Fibrillation    History of Present Illness: [name] is an 81 y.o. female last seen in May. She presents for a routine follow-up visit. Since I last saw her, weight is down significantly, 103 pounds as compared to 120 pounds. She has been evaluated by Dr. Gala Romney, and recently by Dr. Tressie Stalker. She has a history of breast cancer and melanoma. Several laboratory studies were obtained and outlined below. She is also to undergo a bone scan.  She states that she is not trying to lose any weight specifically, appetite is somewhat limited. She continues to have chronic, intermittent leg swelling due to venous stasis, and also contributed to by atrial fibrillation and diastolic heart failure. She states that she has good days and bad days with this. Reports compliance with her diuretics which are outlined below. We have not pushed the doses significantly due to fluctuating renal function. Most recent creatinine was 1.4.  She does not endorse any chest pain or palpitations. She continues on Coumadin which is followed by Dr. Gerarda Fraction.  She also tells me that her Synthroid dose was just reduced within the last month, suspect that this was related to low TSH.   Past Medical History  Diagnosis Date  . Rheumatic heart disease   . IBS (irritable bowel syndrome)   . GERD (gastroesophageal reflux disease)   . Breast cancer   . Mitral regurgitation   . Mitral stenosis   . Atrial fibrillation   . PAT (paroxysmal atrial tachycardia)   . Hypothyroidism   . Melanoma of neck   . Microscopic colitis 2003    Was maintained on Asacol BID  . S/P colonoscopy April 2010    Friable anal canal, left-sided diverticula, adenomatous polyp  . S/P endoscopy April 2010   Benign polyp, s/p 54-F Maloney dilation  . Hiatal hernia   . Diverticula of colon   . Tubulovillous adenoma   . Adenomatous polyp   . Sleep apnea     CPAP at night  . History of cardiac catheterization     Normal coronaries in 2006    Past Surgical History  Procedure Laterality Date  . Abdominal hysterectomy    . Melanoma removal      SURGERY NECK  . Cataracts    . Hemorrhoid surgery    . Tonsillectomy    . Colonoscopy  06/12/2008    Dr. Laurine Blazer anal canal, left-sided diverticula, adenomatous polyp  . Esophagogastroduodenoscopy  06/12/2008    Dr. Marice Potter polyp, s/p 54-F Venia Minks dilation  . Colonoscopy  09/28/2011    Dr. Gala Romney- tubular adenoma, colonic diverticulosis  . Cholecystectomy    . Mastectomy      RIGHT BREAST, followed by left breast after radiation  . Esophagogastroduodenoscopy (egd) with esophageal dilation N/A 02/28/2013    Dr. Rourk:Mild changes of reflux esophagitis. Status post passage of a Maloney dilator. Hiatal hernia. Gastric erosions and gastric polyps-, benign fundic gland polyp  . Esophagogastroduodenoscopy N/A 03/16/2014    Dr. Gala Romney: Suspect early GAVE. No known underlying liver diease. No NSAIDS reported. Patient could easlily be bleeding from her stomach. However. hemoglobin has been normal. A capsule not indicated at this time. Status post biopsies of gastric polyps, hyperplastic.     Current Outpatient  Prescriptions  Medication Sig Dispense Refill  . ALPRAZolam (XANAX) 0.5 MG tablet Take 0.5 mg by mouth at bedtime as needed. Sleep    . CALCIUM PO Take 1 tablet by mouth daily.    . Cholecalciferol (VITAMIN D-3 PO) Take 1 tablet by mouth daily.    Marland Kitchen diltiazem (CARDIZEM CD) 300 MG 24 hr capsule Take 1 capsule (300 mg total) by mouth daily. 90 capsule 3  . estradiol (ESTRACE) 0.1 MG/GM vaginal cream Place 1 Applicatorful vaginally at bedtime. As needed    . furosemide (LASIX) 20 MG tablet Take 20 mg by mouth 2 (two) times daily.    Marland Kitchen  HYDROcodone-acetaminophen (NORCO) 10-325 MG per tablet Take 1 tablet by mouth every 6 (six) hours as needed for moderate pain.     . hydrocortisone (CORTEF) 10 MG tablet Take 10 mg by mouth 2 (two) times daily.     . hydrocortisone cream 1 % Apply 1 application topically daily as needed for itching.    . mirabegron ER (MYRBETRIQ) 25 MG TB24 tablet Take 25 mg by mouth daily.    Marland Kitchen NEXIUM 40 MG capsule TAKE ONE CAPSULE BY MOUTH DAILY BEFORE BREAKFAST. 30 capsule 11  . ondansetron (ZOFRAN) 4 MG tablet Take 1 tablet (4 mg total) by mouth every 8 (eight) hours as needed for nausea or vomiting. 30 tablet 1  . Probiotic Product (ALIGN PO) Take 1 tablet by mouth daily.    . propranolol ER (INDERAL LA) 80 MG 24 hr capsule Take 80 mg by mouth daily.    Marland Kitchen spironolactone (ALDACTONE) 25 MG tablet TAKE 1 TABLET BY MOUTH ONCE DAILY. 90 tablet 3  . traMADol (ULTRAM) 50 MG tablet Take 50 mg by mouth as needed for moderate pain.    Marland Kitchen UNABLE TO FIND 4 (four) times daily as needed. Diclofenac 3%/Lidocaine 5% /Methol 1%    . warfarin (COUMADIN) 2.5 MG tablet Take 1/2 tablet daily except 1 tablet on Mondays and Fridays or as directed (Patient taking differently: Take 2.5 mg by mouth See admin instructions. Take 1/2 tablet daily except 1 tablet on Mondays and Fridays or as directed) 30 tablet 6   No current facility-administered medications for this visit.    Allergies:  Amitiza; Ciprofloxacin; Clindamycin/lincomycin; Doxycycline; Erwinaze; Linzess; and Sulfonamide derivatives   Social History: The patient  reports that she has never smoked. She has never used smokeless tobacco. She reports that she does not drink alcohol or use illicit drugs.   ROS:  Please see the history of present illness. Otherwise, complete review of systems is positive for chronic back pain.  All other systems are reviewed and negative.   Physical Exam: VS:  BP 112/64 mmHg  Pulse 78  Ht 5' (1.524 m)  Wt 103 lb 9.6 oz (46.993 kg)  BMI 20.23  kg/m2  SpO2 99%, BMI Body mass index is 20.23 kg/(m^2).  Wt Readings from Last 3 Encounters:  11/06/14 103 lb 9.6 oz (46.993 kg)  10/20/14 101 lb 6.4 oz (45.995 kg)  09/24/14 105 lb (47.628 kg)     Frail, elderly woman, appears comfortable at rest.  HEENT: Conjunctiva and lids are normal, oropharynx clear.  Neck: Supple, no elevated jugular venous pressure, no carotid bruits. No thyromegaly.  Lungs: Diminished breath sounds, no rales, nonlabored.  Cardiac: Irregularly irregular, 2/6 apical systolic murmur, no S3 gallop, no pericardial rub.  Abdomen: Soft, bowel sounds present.  Extremities: Erythema with bilateral venous varicosities and 1+ edema, distal pulses one plus.  Neuropsychiatric: Alert  oriented x3, affect appropriate.  Skin: Warm and dry.  Musculoskeletal: Kyphosis noted.   ECG: ECG is not ordered today.  Recent Labwork: 03/16/2014: Platelets 204 04/03/2014: Hemoglobin 14.9 08/27/2014: BUN 30*; Potassium 4.4; Sodium 134*; TSH 0.261* 08/31/2014: Creatinine, Ser 1.30* 09/03/2014: ALT 16; AST 22  11/02/2014: CRP 12.9, NT-proBNP 1734, BUN 34, creatinine 1.4, potassium 4.3, AST 23, ALT 15, TSH 0.22, hemoglobin 14.7, platelets 229, sedimentation rate 21  Other Studies Reviewed Today:  Echocardiogram in July 2015 showed normal LV wall thickness with LVEF 60-65% and increased left atrial pressure, moderate aortic regurgitation, rheumatic mitral valve without significant stenosis but moderate mitral regurgitation, severe left atrial enlargement, mild RV enlargement, moderate to severe right atrial enlargement, PASP 45 mm mercury.  ASSESSMENT AND PLAN:  1. Chronic, intermittent leg edema related to venous stasis and also contributed to by atrial fibrillation and diastolic dysfunction. Plan is to continue current diuretic regimen, Lasix dose could be temporarily advanced for a few days at a time if symptoms worsen, but would try to avoid pushing her standing dose too much in  light of renal insufficiency. Also elevate legs when she can. Albumen was normal range recently.  2. Valvular heart disease including aortic and mitral regurgitation with rheumatic findings. Last echocardiogram was in July 2015, follow-up study will be obtained.  3. Chronic atrial fibrillation, continue strategy of heart rate control and anticoagulation.  Current medicines were reviewed at length with the patient today.   Orders Placed This Encounter  Procedures  . Echocardiogram    Disposition: FU with me in 3 months.   Signed, Satira Sark, MD, [facility] 11/06/2014 8:35 AM    [place] [facility] [address], [place], [place] [address] Phone: [phone]; Fax: [phone]

## 2014-11-10 ENCOUNTER — Encounter (HOSPITAL_COMMUNITY): Payer: Self-pay

## 2014-11-10 ENCOUNTER — Encounter (HOSPITAL_COMMUNITY)
Admission: RE | Admit: 2014-11-10 | Discharge: 2014-11-10 | Disposition: A | Discharge status: Home / Self Care | Payer: Medicare Other | Source: Ambulatory Visit | Attending: Oncology | Admitting: Oncology

## 2014-11-10 DIAGNOSIS — Z853 Personal history of malignant neoplasm of breast: Secondary | ICD-10-CM | POA: Diagnosis not present

## 2014-11-10 MED ORDER — TECHNETIUM TC 99M MEDRONATE IV KIT
25.0000 | PACK | Freq: Once | INTRAVENOUS | Status: AC | PRN
  Administered 2014-11-10: 26 via INTRAVENOUS

## 2014-11-13 ENCOUNTER — Ambulatory Visit (HOSPITAL_COMMUNITY)
Admission: RE | Admit: 2014-11-13 | Discharge: 2014-11-13 | Disposition: A | Discharge status: Home / Self Care | Payer: Medicare Other | Source: Ambulatory Visit | Attending: Cardiology | Admitting: Cardiology

## 2014-11-13 DIAGNOSIS — I482 Chronic atrial fibrillation, unspecified: Secondary | ICD-10-CM

## 2014-11-13 DIAGNOSIS — I351 Nonrheumatic aortic (valve) insufficiency: Secondary | ICD-10-CM | POA: Diagnosis not present

## 2014-12-02 ENCOUNTER — Ambulatory Visit: Payer: Medicare Other | Admitting: Gastroenterology

## 2014-12-04 ENCOUNTER — Other Ambulatory Visit: Payer: Self-pay | Admitting: Cardiology

## 2014-12-22 ENCOUNTER — Encounter: Payer: Self-pay | Admitting: Internal Medicine

## 2014-12-22 ENCOUNTER — Ambulatory Visit (INDEPENDENT_AMBULATORY_CARE_PROVIDER_SITE_OTHER): Payer: Medicare Other | Admitting: Internal Medicine

## 2014-12-22 VITALS — BP 130/75 | HR 76 | Temp 98.3°F | Ht 60.0 in | Wt 106.0 lb

## 2014-12-22 DIAGNOSIS — K219 Gastro-esophageal reflux disease without esophagitis: Secondary | ICD-10-CM | POA: Diagnosis not present

## 2014-12-22 DIAGNOSIS — R634 Abnormal weight loss: Secondary | ICD-10-CM

## 2014-12-22 NOTE — Patient Instructions (Signed)
Stop Nexium  Use Pepsid AC as needed for occasional GERD symptoms  Use Miralax 17 grams at bedtime daily for constipation  Office visit in 6 months  Keep appointments with Dr. Domenic Polite, Tressie Stalker and Gerarda Fraction

## 2014-12-22 NOTE — Progress Notes (Signed)
Primary Care Physician:  [name], MD Primary Gastroenterologist:  Dr. Gala Romney  Pre-Procedure History & Physical: HPI:  [name] is a 81 y.o. female here for weight loss, nausea, constipation. She has seen Drs. Mcdowell and Dr. Tressie Stalker. History multiple cancers. Apparently, not felt to have any recurrent occult neoplasm. Patient has significant cardiac issues. 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 I note her weight is  increased by 5 pounds since she was last here. She tells me she tried Marinol but it made her "knees knock" and caused anxiety. She has not been taking Nexium; can't tell a difference on or off this medication. Has been diagnosed with osteoporosis. Has abdominal cramps in the setting of constipation. Doesn't take MiraLax regularly. Does take a fiber supplement. Hasn't had any melena or rectal bleeding. No dysphagia.   Past Medical History  Diagnosis Date  . Rheumatic heart disease   . IBS (irritable bowel syndrome)   . GERD (gastroesophageal reflux disease)   . Breast cancer ([place])   . Mitral regurgitation   . Mitral stenosis   . Atrial fibrillation ([place])   . PAT (paroxysmal atrial tachycardia) ([place])   . Hypothyroidism   . Melanoma of neck ([place])   . Microscopic colitis 2003    Was maintained on Asacol BID  . S/P colonoscopy April 2010    Friable anal canal, left-sided diverticula, adenomatous polyp  . S/P endoscopy April 2010    Benign polyp, s/p 54-F Maloney dilation  . Hiatal hernia   . Diverticula of colon   . Tubulovillous adenoma   . Adenomatous polyp   . Sleep apnea     CPAP at night  . History of cardiac catheterization     Normal coronaries in 2006    Past Surgical History  Procedure Laterality Date  . Abdominal hysterectomy    . Melanoma removal      SURGERY NECK  . Cataracts    . Hemorrhoid surgery    . Tonsillectomy    . Colonoscopy  06/12/2008    Dr. Laurine Blazer anal canal, left-sided  diverticula, adenomatous polyp  . Esophagogastroduodenoscopy  06/12/2008    Dr. Marice Potter polyp, s/p 54-F Venia Minks dilation  . Colonoscopy  09/28/2011    Dr. Gala Romney- tubular adenoma, colonic diverticulosis  . Cholecystectomy    . Mastectomy      RIGHT BREAST, followed by left breast after radiation  . Esophagogastroduodenoscopy (egd) with esophageal dilation N/A 02/28/2013    Dr. Zamora Colton:Mild changes of reflux esophagitis. Status post passage of a Maloney dilator. Hiatal hernia. Gastric erosions and gastric polyps-, benign fundic gland polyp  . Esophagogastroduodenoscopy N/A 03/16/2014    Dr. Gala Romney: Suspect early GAVE. No known underlying liver diease. No NSAIDS reported. Patient could easlily be bleeding from her stomach. However. hemoglobin has been normal. A capsule not indicated at this time. Status post biopsies of gastric polyps, hyperplastic.     Prior to Admission medications   Medication Sig Start Date End Date Taking? Authorizing Provider  ALPRAZolam Duanne Moron) 0.5 MG tablet Take 0.5 mg by mouth at bedtime as needed. Sleep 06/22/11  Yes Historical Provider, MD  CALCIUM PO Take 1 tablet by mouth daily.   Yes Historical Provider, MD  Cholecalciferol (VITAMIN D-3 PO) Take 1 tablet by mouth daily.   Yes Historical Provider, MD  diltiazem (CARDIZEM CD) 300 MG 24 hr capsule Take 1 capsule (300 mg total) by mouth daily. 01/05/14  Yes Satira Sark, MD  estradiol (  ESTRACE) 0.1 MG/GM vaginal cream Place 1 Applicatorful vaginally at bedtime. As needed   Yes Historical Provider, MD  furosemide (LASIX) 20 MG tablet Take 20 mg by mouth 2 (two) times daily.   Yes Historical Provider, MD  HYDROcodone-acetaminophen (NORCO) 10-325 MG per tablet Take 1 tablet by mouth every 6 (six) hours as needed for moderate pain.    Yes Historical Provider, MD  hydrocortisone (CORTEF) 10 MG tablet Take 10 mg by mouth 2 (two) times daily.    Yes Historical Provider, MD  hydrocortisone cream 1 % Apply 1 application  topically daily as needed for itching. 03/01/12  Yes Nimish Luther Parody, MD  levothyroxine (SYNTHROID, LEVOTHROID) 100 MCG tablet Take 100 mcg by mouth daily before breakfast.   Yes Historical Provider, MD  mirabegron ER (MYRBETRIQ) 25 MG TB24 tablet Take 25 mg by mouth daily.   Yes Historical Provider, MD  NEXIUM 40 MG capsule TAKE ONE CAPSULE BY MOUTH DAILY BEFORE BREAKFAST. 05/27/14  Yes Mahala Menghini, PA-C  ondansetron (ZOFRAN) 4 MG tablet Take 1 tablet (4 mg total) by mouth every 8 (eight) hours as needed for nausea or vomiting. 08/19/14  Yes Orvil Feil, NP  Probiotic Product (ALIGN PO) Take 1 tablet by mouth daily.   Yes Historical Provider, MD  propranolol ER (INDERAL LA) 80 MG 24 hr capsule Take 80 mg by mouth daily.   Yes Historical Provider, MD  spironolactone (ALDACTONE) 25 MG tablet TAKE 1 TABLET BY MOUTH ONCE DAILY. 08/27/14  Yes Satira Sark, MD  warfarin (COUMADIN) 2.5 MG tablet Take 1/2 tablet daily except 1 tablet on Mondays and Fridays or as directed Patient taking differently: Take 2.5 mg by mouth See admin instructions. Take 1/2 tablet daily except 1 tablet on Mondays and Fridays or as directed 01/05/14  Yes Satira Sark, MD    Allergies as of 12/22/2014 - Review Complete 12/22/2014  Allergen Reaction Noted  . Amitiza [lubiprostone] Swelling 01/10/2012  . Ciprofloxacin Nausea And Vomiting 06/07/2011  . Clindamycin/lincomycin  12/30/2012  . Doxycycline  12/30/2012  . Erwinaze [asparaginase erwinia]  08/13/2013  . Linzess [linaclotide] Swelling and Rash 02/27/2012  . Sulfonamide derivatives Nausea And Vomiting and Rash 10/23/2007    Family History  Problem Relation Age of Onset  . Stroke Mother   . Heart attack Father   . Breast cancer Sister   . Colon cancer Neg Hx     Social History   Social History  . Marital Status: Single    Spouse Name: N/A  . Number of Children: N/A  . Years of Education: N/A   Occupational History  . RETIRED    Social History  Main Topics  . Smoking status: Never Smoker   . Smokeless tobacco: Never Used  . Alcohol Use: No  . Drug Use: No  . Sexual Activity: No   Other Topics Concern  . Not on file   Social History Narrative    Review of Systems: See HPI, otherwise negative ROS  Physical Exam: BP 130/75 mmHg  Pulse 76  Temp(Src) 98.3 F (36.8 C) (Oral)  Ht 5' (1.524 m)  Wt 106 lb (48.081 kg)  BMI 20.70 kg/m2 General:   Elderly, somewhat chronically ill-appearing lady pleasant and cooperative in NAD Skin:  Intact without significant lesions or rashes. Eyes:  Sclera clear, no icterus.   Conjunctiva pink. Ears:  Normal auditory acuity. Nose:  No deformity, discharge,  or lesions. Mouth:  No deformity or lesions. Neck:  Supple; no masses  or thyromegaly. No significant cervical adenopathy. Lungs:  Clear throughout to auscultation.   No wheezes, crackles, or rhonchi. No acute distress. Heart:  Regular rate and rhythm; no murmurs, clicks, rubs,  or gallops. Abdomen: Non-distended, normal bowel sounds.  Soft and nontender without appreciable mass or hepatosplenomegaly.  Pulses:  Normal pulses noted. Extremities:  Without clubbing or edema.  Impression:    Pleasant Elderly lady with failure to thrive, chronic constipation,  weight loss in the setting of multiple comorbidities including a history of multiple cancers.  Reassuring to know that there is no evidence of recurrent neoplasia.  Patient being followed by Dr. Tressie Stalker.  Recent weight gain reassuring. Clinically, appears to be not benefiting from Nexium very much. History of osteoporosis.   Recommendations:  Stop Nexium  Use Pepsid AC as needed for occasional GERD symptoms  Use Miralax 17 grams at bedtime daily for constipation  Office visit in 6 months  Keep appointments with Dr. Domenic Polite, Tressie Stalker and [place]     Notice: This dictation was prepared with Dragon dictation along with smaller phrase technology. Any transcriptional errors that  result from this process are unintentional and may not be corrected upon review.

## 2014-12-30 ENCOUNTER — Ambulatory Visit (INDEPENDENT_AMBULATORY_CARE_PROVIDER_SITE_OTHER): Payer: Medicare Other | Admitting: *Deleted

## 2014-12-30 DIAGNOSIS — I4891 Unspecified atrial fibrillation: Secondary | ICD-10-CM | POA: Diagnosis not present

## 2014-12-30 DIAGNOSIS — Z5181 Encounter for therapeutic drug level monitoring: Secondary | ICD-10-CM

## 2014-12-30 LAB — POCT INR: INR: 1.9

## 2014-12-30 MED ORDER — WARFARIN SODIUM 2.5 MG PO TABS: ORAL_TABLET | ORAL | Status: AC

## 2015-01-04 ENCOUNTER — Other Ambulatory Visit: Payer: Self-pay | Admitting: Cardiology

## 2015-01-21 ENCOUNTER — Observation Stay (HOSPITAL_COMMUNITY)
Admission: EM | Admit: 2015-01-21 | Discharge: 2015-01-22 | Disposition: A | Discharge status: Home / Self Care | Payer: Medicare Other | Attending: Internal Medicine | Admitting: Internal Medicine

## 2015-01-21 ENCOUNTER — Emergency Department (HOSPITAL_COMMUNITY): Payer: Medicare Other

## 2015-01-21 ENCOUNTER — Encounter (HOSPITAL_COMMUNITY): Payer: Self-pay | Admitting: Cardiology

## 2015-01-21 ENCOUNTER — Telehealth: Payer: Self-pay

## 2015-01-21 DIAGNOSIS — E039 Hypothyroidism, unspecified: Secondary | ICD-10-CM | POA: Diagnosis not present

## 2015-01-21 DIAGNOSIS — I499 Cardiac arrhythmia, unspecified: Secondary | ICD-10-CM | POA: Insufficient documentation

## 2015-01-21 DIAGNOSIS — R0789 Other chest pain: Secondary | ICD-10-CM | POA: Diagnosis not present

## 2015-01-21 DIAGNOSIS — K5900 Constipation, unspecified: Secondary | ICD-10-CM | POA: Insufficient documentation

## 2015-01-21 DIAGNOSIS — Z86018 Personal history of other benign neoplasm: Secondary | ICD-10-CM | POA: Diagnosis not present

## 2015-01-21 DIAGNOSIS — Z7952 Long term (current) use of systemic steroids: Secondary | ICD-10-CM | POA: Insufficient documentation

## 2015-01-21 DIAGNOSIS — R079 Chest pain, unspecified: Secondary | ICD-10-CM | POA: Diagnosis present

## 2015-01-21 DIAGNOSIS — K219 Gastro-esophageal reflux disease without esophagitis: Secondary | ICD-10-CM | POA: Diagnosis not present

## 2015-01-21 DIAGNOSIS — K449 Diaphragmatic hernia without obstruction or gangrene: Secondary | ICD-10-CM | POA: Diagnosis not present

## 2015-01-21 DIAGNOSIS — Z7901 Long term (current) use of anticoagulants: Secondary | ICD-10-CM | POA: Insufficient documentation

## 2015-01-21 DIAGNOSIS — R0602 Shortness of breath: Secondary | ICD-10-CM | POA: Diagnosis not present

## 2015-01-21 DIAGNOSIS — I099 Rheumatic heart disease, unspecified: Secondary | ICD-10-CM | POA: Insufficient documentation

## 2015-01-21 DIAGNOSIS — K589 Irritable bowel syndrome without diarrhea: Secondary | ICD-10-CM | POA: Diagnosis not present

## 2015-01-21 DIAGNOSIS — R1031 Right lower quadrant pain: Secondary | ICD-10-CM | POA: Diagnosis not present

## 2015-01-21 DIAGNOSIS — I359 Nonrheumatic aortic valve disorder, unspecified: Secondary | ICD-10-CM | POA: Diagnosis present

## 2015-01-21 DIAGNOSIS — I340 Nonrheumatic mitral (valve) insufficiency: Secondary | ICD-10-CM | POA: Diagnosis not present

## 2015-01-21 DIAGNOSIS — I4891 Unspecified atrial fibrillation: Secondary | ICD-10-CM | POA: Diagnosis not present

## 2015-01-21 DIAGNOSIS — G4730 Sleep apnea, unspecified: Secondary | ICD-10-CM | POA: Diagnosis not present

## 2015-01-21 DIAGNOSIS — K5730 Diverticulosis of large intestine without perforation or abscess without bleeding: Secondary | ICD-10-CM | POA: Diagnosis not present

## 2015-01-21 DIAGNOSIS — Z853 Personal history of malignant neoplasm of breast: Secondary | ICD-10-CM | POA: Insufficient documentation

## 2015-01-21 DIAGNOSIS — I471 Supraventricular tachycardia: Secondary | ICD-10-CM | POA: Diagnosis not present

## 2015-01-21 DIAGNOSIS — I480 Paroxysmal atrial fibrillation: Secondary | ICD-10-CM

## 2015-01-21 DIAGNOSIS — I059 Rheumatic mitral valve disease, unspecified: Secondary | ICD-10-CM | POA: Diagnosis present

## 2015-01-21 LAB — CBC
HCT: 40.7 % (ref 36.0–46.0)
Hemoglobin: 13.9 g/dL (ref 12.0–15.0)
MCH: 32.6 pg (ref 26.0–34.0)
MCHC: 34.2 g/dL (ref 30.0–36.0)
MCV: 95.5 fL (ref 78.0–100.0)
PLATELETS: 258 10*3/uL (ref 150–400)
RBC: 4.26 MIL/uL (ref 3.87–5.11)
RDW: 14 % (ref 11.5–15.5)
WBC: 6.7 10*3/uL (ref 4.0–10.5)

## 2015-01-21 LAB — COMPREHENSIVE METABOLIC PANEL
ALT: 24 U/L (ref 14–54)
ANION GAP: 10 (ref 5–15)
AST: 33 U/L (ref 15–41)
Albumin: 4 g/dL (ref 3.5–5.0)
Alkaline Phosphatase: 49 U/L (ref 38–126)
BUN: 34 mg/dL — ABNORMAL HIGH (ref 6–20)
CHLORIDE: 96 mmol/L — AB (ref 101–111)
CO2: 27 mmol/L (ref 22–32)
Calcium: 8.8 mg/dL — ABNORMAL LOW (ref 8.9–10.3)
Creatinine, Ser: 1.15 mg/dL — ABNORMAL HIGH (ref 0.44–1.00)
GFR, EST AFRICAN AMERICAN: 50 mL/min — AB (ref >60)
GFR, EST NON AFRICAN AMERICAN: 43 mL/min — AB (ref >60)
Glucose, Bld: 110 mg/dL — ABNORMAL HIGH (ref 65–99)
POTASSIUM: 3.6 mmol/L (ref 3.5–5.1)
SODIUM: 133 mmol/L — AB (ref 135–145)
Total Bilirubin: 1.1 mg/dL (ref 0.3–1.2)
Total Protein: 7.2 g/dL (ref 6.5–8.1)

## 2015-01-21 LAB — URINE MICROSCOPIC-ADD ON
RBC / HPF: NONE SEEN RBC/hpf (ref 0–5)
SQUAMOUS EPITHELIAL / LPF: NONE SEEN

## 2015-01-21 LAB — URINALYSIS, ROUTINE W REFLEX MICROSCOPIC
BILIRUBIN URINE: NEGATIVE
Glucose, UA: NEGATIVE mg/dL
Hgb urine dipstick: NEGATIVE
Ketones, ur: NEGATIVE mg/dL
NITRITE: NEGATIVE
PH: 5.5 (ref 5.0–8.0)
SPECIFIC GRAVITY, URINE: 1.015 (ref 1.005–1.030)

## 2015-01-21 LAB — TROPONIN I
Troponin I: <0.03 ng/mL (ref <0.031)
Troponin I: <0.03 ng/mL (ref <0.031)

## 2015-01-21 LAB — PROTIME-INR
INR: 3.91 — AB (ref 0.00–1.49)
PROTHROMBIN TIME: 37.3 s — AB (ref 11.6–15.2)

## 2015-01-21 MED ORDER — GI COCKTAIL ~~LOC~~: 30.0000 mL | Freq: Four times a day (QID) | ORAL | Status: DC | PRN

## 2015-01-21 MED ORDER — NITROGLYCERIN 0.4 MG SL SUBL
0.4000 mg | SUBLINGUAL_TABLET | SUBLINGUAL | Status: DC | PRN
Start: 2015-01-21 — End: 2015-01-21
  Administered 2015-01-21 (×2): 0.4 mg via SUBLINGUAL
  Filled 2015-01-21 (×2): qty 1

## 2015-01-21 MED ORDER — ACETAMINOPHEN 325 MG PO TABS: 650.0000 mg | ORAL_TABLET | ORAL | Status: DC | PRN

## 2015-01-21 MED ORDER — LEVOTHYROXINE SODIUM 100 MCG PO TABS
100.0000 ug | ORAL_TABLET | Freq: Every day | ORAL | Status: DC
  Administered 2015-01-22: 100 ug via ORAL
  Filled 2015-01-21: qty 1

## 2015-01-21 MED ORDER — ESTRADIOL 0.1 MG/GM VA CREA
1.0000 | TOPICAL_CREAM | Freq: Every day | VAGINAL | Status: DC
  Filled 2015-01-21: qty 42.5

## 2015-01-21 MED ORDER — MIRABEGRON ER 25 MG PO TB24
25.0000 mg | ORAL_TABLET | Freq: Every day | ORAL | Status: DC
  Administered 2015-01-21 – 2015-01-22 (×2): 25 mg via ORAL
  Filled 2015-01-21 (×2): qty 1

## 2015-01-21 MED ORDER — HYDROCODONE-ACETAMINOPHEN 10-325 MG PO TABS
1.0000 | ORAL_TABLET | Freq: Four times a day (QID) | ORAL | Status: DC | PRN
  Administered 2015-01-21 – 2015-01-22 (×2): 1 via ORAL
  Filled 2015-01-21 (×2): qty 1

## 2015-01-21 MED ORDER — SPIRONOLACTONE 25 MG PO TABS
25.0000 mg | ORAL_TABLET | Freq: Every day | ORAL | Status: DC
  Administered 2015-01-21 – 2015-01-22 (×2): 25 mg via ORAL
  Filled 2015-01-21 (×2): qty 1

## 2015-01-21 MED ORDER — SODIUM CHLORIDE 0.9 % IV BOLUS (SEPSIS)
500.0000 mL | Freq: Once | INTRAVENOUS | Status: AC
  Administered 2015-01-21: 500 mL via INTRAVENOUS

## 2015-01-21 MED ORDER — PROPRANOLOL HCL ER 80 MG PO CP24
80.0000 mg | ORAL_CAPSULE | Freq: Every day | ORAL | Status: DC
  Administered 2015-01-22: 80 mg via ORAL
  Filled 2015-01-21 (×5): qty 1

## 2015-01-21 MED ORDER — DILTIAZEM HCL ER COATED BEADS 180 MG PO CP24
300.0000 mg | ORAL_CAPSULE | Freq: Every day | ORAL | Status: DC
  Administered 2015-01-21 – 2015-01-22 (×2): 300 mg via ORAL
  Filled 2015-01-21 (×2): qty 1

## 2015-01-21 MED ORDER — ONDANSETRON HCL 4 MG/2ML IJ SOLN
4.0000 mg | Freq: Four times a day (QID) | INTRAMUSCULAR | Status: DC | PRN
  Administered 2015-01-21: 4 mg via INTRAVENOUS
  Filled 2015-01-21: qty 2

## 2015-01-21 MED ORDER — ENOXAPARIN SODIUM 40 MG/0.4ML ~~LOC~~ SOLN: 40.0000 mg | SUBCUTANEOUS | Status: DC

## 2015-01-21 MED ORDER — HYDROCORTISONE 10 MG PO TABS
10.0000 mg | ORAL_TABLET | Freq: Two times a day (BID) | ORAL | Status: DC
  Administered 2015-01-21 – 2015-01-22 (×2): 10 mg via ORAL
  Filled 2015-01-21 (×10): qty 1

## 2015-01-21 MED ORDER — ALPRAZOLAM 0.5 MG PO TABS
0.5000 mg | ORAL_TABLET | Freq: Every evening | ORAL | Status: DC | PRN
  Administered 2015-01-21: 0.5 mg via ORAL
  Filled 2015-01-21: qty 1

## 2015-01-21 NOTE — Telephone Encounter (Signed)
Pt called [place] office and was transferred her for triage cal for CP.She states she has has CP for the last several nights and it was severe last night which included SOB.She does not use NTG. I advised her to got direct to [facility] ED.A family member was taking her.   Will FYI Dr Domenic Polite

## 2015-01-21 NOTE — Progress Notes (Signed)
ANTICOAGULATION CONSULT NOTE - Initial Consult  Pharmacy Consult for coumadin Indication: atrial fibrillation  Allergies  Allergen Reactions  . Amitiza [Lubiprostone] Swelling  . Ciprofloxacin Nausea And Vomiting  . Clindamycin/Lincomycin     Nauseated/headache  . Doxycycline     Nauseated/headache  . Erwinaze [Asparaginase Erwinia]     Told not to take by dr  . Rolan Lipa [Linaclotide] Swelling and Rash  . Sulfonamide Derivatives Nausea And Vomiting and Rash    Patient Measurements: Height: 5\' 5"  (165.1 cm) Weight: 103 lb 3.2 oz (46.811 kg) IBW/kg (Calculated) : 57   Vital Signs: Temp: 98 F (36.7 C) (12/01 1429) Temp Source: Oral (12/01 1201) BP: 112/57 mmHg (12/01 1429) Pulse Rate: 62 (12/01 1429)  Labs:  Recent Labs  01/21/15 0924  HGB 13.9  HCT 40.7  PLT 258  LABPROT 37.3*  INR 3.91*  CREATININE 1.15*  TROPONINI <0.03    Estimated Creatinine Clearance: 27.4 mL/min (by C-G formula based on Cr of 1.15).   Medical History: Past Medical History  Diagnosis Date  . Rheumatic heart disease   . IBS (irritable bowel syndrome)   . GERD (gastroesophageal reflux disease)   . Breast cancer ([place])   . Mitral regurgitation   . Mitral stenosis   . Atrial fibrillation ([place])   . PAT (paroxysmal atrial tachycardia) ([place])   . Hypothyroidism   . Melanoma of neck ([place])   . Microscopic colitis 2003    Was maintained on Asacol BID  . S/P colonoscopy April 2010    Friable anal canal, left-sided diverticula, adenomatous polyp  . S/P endoscopy April 2010    Benign polyp, s/p 54-F Maloney dilation  . Hiatal hernia   . Diverticula of colon   . Tubulovillous adenoma   . Adenomatous polyp   . Sleep apnea     CPAP at night  . History of cardiac catheterization     Normal coronaries in 2006    Medications:  Prescriptions prior to admission  Medication Sig Dispense Refill Last Dose  . ALPRAZolam (XANAX) 0.5 MG tablet Take 0.5 mg by mouth at bedtime as needed. Sleep    unknown  . CALCIUM PO Take 1 tablet by mouth daily.   01/20/2015 at Unknown time  . Cholecalciferol (VITAMIN D-3 PO) Take 1 tablet by mouth daily.   01/20/2015 at Unknown time  . diltiazem (CARDIZEM CD) 300 MG 24 hr capsule TAKE (1) CAPSULE BY MOUTH ONCE DAILY. 30 capsule 9 01/20/2015 at Unknown time  . estradiol (ESTRACE) 0.1 MG/GM vaginal cream Place 1 Applicatorful vaginally at bedtime. As needed   unknown  . furosemide (LASIX) 20 MG tablet Take 20 mg by mouth 2 (two) times daily.   01/20/2015 at Unknown time  . HYDROcodone-acetaminophen (NORCO) 10-325 MG per tablet Take 1 tablet by mouth every 6 (six) hours as needed for moderate pain.    01/20/2015 at Unknown time  . hydrocortisone (CORTEF) 10 MG tablet Take 10 mg by mouth 2 (two) times daily.    01/20/2015 at Unknown time  . hydrocortisone cream 1 % Apply 1 application topically daily as needed for itching.   unknown  . levothyroxine (SYNTHROID, LEVOTHROID) 100 MCG tablet Take 100 mcg by mouth daily before breakfast.   01/20/2015 at Unknown time  . mirabegron ER (MYRBETRIQ) 25 MG TB24 tablet Take 25 mg by mouth daily.   01/20/2015 at Unknown time  . ondansetron (ZOFRAN) 4 MG tablet Take 1 tablet (4 mg total) by mouth every 8 (eight) hours as needed  for nausea or vomiting. 30 tablet 1 unknown  . Probiotic Product (ALIGN PO) Take 1 tablet by mouth daily.   01/20/2015 at Unknown time  . propranolol ER (INDERAL LA) 80 MG 24 hr capsule Take 80 mg by mouth daily.   01/20/2015 at Unknown time  . spironolactone (ALDACTONE) 25 MG tablet TAKE 1 TABLET BY MOUTH ONCE DAILY. 90 tablet 3 01/20/2015 at Unknown time  . warfarin (COUMADIN) 2.5 MG tablet Take 1/2 tablet daily except 1 tablet on Mondays and Fridays or as directed 30 tablet 6 01/20/2015 at Unknown time  . NEXIUM 40 MG capsule TAKE ONE CAPSULE BY MOUTH DAILY BEFORE BREAKFAST. (Patient not taking: Reported on 01/21/2015) 30 capsule 11 Taking    Assessment: 81 yo lady to continue coumadin for  afib.  INR is elevated on admission and per medication history has already taken her coumadin today. Goal of Therapy:  INR 2-3 Monitor platelets by anticoagulation protocol: Yes   Plan:  Hold coumadin today Daily PT/INR Monitor for bleeding complications.  Thanks for allowing pharmacy to be a part of this patient's care.  Excell Seltzer, PharmD Clinical Pharmacist 01/21/2015,3:38 PM

## 2015-01-21 NOTE — H&P (Signed)
[facility]          History and Physical    PCP:   [name], MD   EDP: Evalee Jefferson, PA  Chief Complaint:  "I am nervous, afraid to stay alone, I am afraid I am having a heart attack"  HPI: Patient is an 81 year old woman with history of atrial fibrillation chronically anticoagulated on Coumadin, history of rheumatic heart disease. She appears quite nervous and anxious on exam today. She states that for the past 3 days she has been experiencing increasing sensation of anxiety while being at home alone. She also describes pain over her left chest without radiation, today she decided to come into the hospital for evaluation. Troponin was negative in the emergency department, EKG showed atrial fibrillation at a controlled rate. We are asked to admit her for further evaluation and management.  Allergies:   Allergies  Allergen Reactions  . Amitiza [Lubiprostone] Swelling  . Ciprofloxacin Nausea And Vomiting  . Clindamycin/Lincomycin     Nauseated/headache  . Doxycycline     Nauseated/headache  . Erwinaze [Asparaginase Erwinia]     Told not to take by dr  . Rolan Lipa [Linaclotide] Swelling and Rash  . Sulfonamide Derivatives Nausea And Vomiting and Rash      Past Medical History  Diagnosis Date  . Rheumatic heart disease   . IBS (irritable bowel syndrome)   . GERD (gastroesophageal reflux disease)   . Breast cancer ([place])   . Mitral regurgitation   . Mitral stenosis   . Atrial fibrillation ([place])   . PAT (paroxysmal atrial tachycardia) ([place])   . Hypothyroidism   . Melanoma of neck ([place])   . Microscopic colitis 2003    Was maintained on Asacol BID  . S/P colonoscopy April 2010    Friable anal canal, left-sided diverticula, adenomatous polyp  . S/P endoscopy April 2010    Benign polyp, s/p 54-F Maloney dilation  . Hiatal hernia   . Diverticula of colon   . Tubulovillous adenoma   . Adenomatous polyp   . Sleep apnea     CPAP at night  .  History of cardiac catheterization     Normal coronaries in 2006    Past Surgical History  Procedure Laterality Date  . Abdominal hysterectomy    . Melanoma removal      SURGERY NECK  . Cataracts    . Hemorrhoid surgery    . Tonsillectomy    . Colonoscopy  06/12/2008    Dr. Laurine Blazer anal canal, left-sided diverticula, adenomatous polyp  . Esophagogastroduodenoscopy  06/12/2008    Dr. Marice Potter polyp, s/p 54-F Venia Minks dilation  . Colonoscopy  09/28/2011    Dr. Gala Romney- tubular adenoma, colonic diverticulosis  . Cholecystectomy    . Mastectomy      RIGHT BREAST, followed by left breast after radiation  . Esophagogastroduodenoscopy (egd) with esophageal dilation N/A 02/28/2013    Dr. Rourk:Mild changes of reflux esophagitis. Status post passage of a Maloney dilator. Hiatal hernia. Gastric erosions and gastric polyps-, benign fundic gland polyp  . Esophagogastroduodenoscopy N/A 03/16/2014    Dr. Gala Romney: Suspect early GAVE. No known underlying liver diease. No NSAIDS reported. Patient could easlily be bleeding from her stomach. However. hemoglobin has been normal. A capsule not indicated at this time. Status post biopsies of gastric polyps, hyperplastic.     Prior to Admission medications   Medication Sig Start Date End Date Taking? Authorizing Provider  ALPRAZolam (XANAX) 0.5 MG tablet Take 0.5 mg by mouth at bedtime as needed. Sleep 06/22/11  Yes Historical Provider, MD  CALCIUM PO Take 1 tablet by mouth daily.   Yes Historical Provider, MD  Cholecalciferol (VITAMIN D-3 PO) Take 1 tablet by mouth daily.   Yes Historical Provider, MD  diltiazem (CARDIZEM CD) 300 MG 24 hr capsule TAKE (1) CAPSULE BY MOUTH ONCE DAILY. 01/05/15  Yes Satira Sark, MD  estradiol (ESTRACE) 0.1 MG/GM vaginal cream Place 1 Applicatorful vaginally at bedtime. As needed   Yes Historical Provider, MD  furosemide (LASIX) 20 MG tablet Take 20 mg by mouth 2 (two) times daily.   Yes Historical Provider, MD    HYDROcodone-acetaminophen (NORCO) 10-325 MG per tablet Take 1 tablet by mouth every 6 (six) hours as needed for moderate pain.    Yes Historical Provider, MD  hydrocortisone (CORTEF) 10 MG tablet Take 10 mg by mouth 2 (two) times daily.    Yes Historical Provider, MD  hydrocortisone cream 1 % Apply 1 application topically daily as needed for itching. 03/01/12  Yes Nimish Luther Parody, MD  levothyroxine (SYNTHROID, LEVOTHROID) 100 MCG tablet Take 100 mcg by mouth daily before breakfast.   Yes Historical Provider, MD  mirabegron ER (MYRBETRIQ) 25 MG TB24 tablet Take 25 mg by mouth daily.   Yes Historical Provider, MD  ondansetron (ZOFRAN) 4 MG tablet Take 1 tablet (4 mg total) by mouth every 8 (eight) hours as needed for nausea or vomiting. 08/19/14  Yes Orvil Feil, NP  Probiotic Product (ALIGN PO) Take 1 tablet by mouth daily.   Yes Historical Provider, MD  propranolol ER (INDERAL LA) 80 MG 24 hr capsule Take 80 mg by mouth daily.   Yes Historical Provider, MD  spironolactone (ALDACTONE) 25 MG tablet TAKE 1 TABLET BY MOUTH ONCE DAILY. 08/27/14  Yes Satira Sark, MD  warfarin (COUMADIN) 2.5 MG tablet Take 1/2 tablet daily except 1 tablet on Mondays and Fridays or as directed 12/30/14  Yes Satira Sark, MD  NEXIUM 40 MG capsule TAKE ONE CAPSULE BY MOUTH DAILY BEFORE BREAKFAST. Patient not taking: Reported on 01/21/2015 05/27/14   Mahala Menghini, PA-C    Social History:  reports that she has never smoked. She has never used smokeless tobacco. She reports that she does not drink alcohol or use illicit drugs.  Family History  Problem Relation Age of Onset  . Stroke Mother   . Heart attack Father   . Breast cancer Sister   . Colon cancer Neg Hx     Review of Systems:  Constitutional: Denies fever, chills, diaphoresis, appetite change and fatigue.  HEENT: Denies photophobia, eye pain, redness, hearing loss, ear pain, congestion, sore throat, rhinorrhea, sneezing, mouth sores, trouble swallowing,  neck pain, neck stiffness and tinnitus.   Respiratory: Denies SOB, DOE, cough, chest tightness,  and wheezing.   Cardiovascular: Denies  palpitations and leg swelling.  Gastrointestinal: Denies nausea, vomiting, abdominal pain, diarrhea, constipation, blood in stool and abdominal distention.  Genitourinary: Denies dysuria, urgency, frequency, hematuria, flank pain and difficulty urinating.  Endocrine: Denies: hot or cold intolerance, sweats, changes in hair or nails, polyuria, polydipsia. Musculoskeletal: Denies myalgias, back pain, joint swelling, arthralgias and gait problem.  Skin: Denies pallor, rash and wound.  Neurological: Denies dizziness, seizures, syncope, weakness, light-headedness, numbness and headaches.  Hematological: Denies adenopathy. Easy bruising, personal or family bleeding history  Psychiatric/Behavioral: Denies suicidal ideation, mood changes, confusion, nervousness, sleep disturbance and agitation   Physical  Exam: Blood pressure 112/57, pulse 62, temperature 98 F (36.7 C), temperature source Oral, resp. rate 16, height $RemoveBe'5\' 5"'nJcTKKqaz$  (1.651 m), weight 46.811 kg (103 lb 3.2 oz), SpO2 97 %. General: Alert, awake, oriented 3, HEENT: Normocephalic, atraumatic, pupils equal round and reactive to light, extraocular movements intact, wears corrective lenses Neck: Supple, no JVD, no lymphadenopathy, no bruits, no goiter. Next an cardiovascular: Regular rate and rhythm, no murmurs, rubs or gallops. Lungs: Clear to auscultation bilaterally. Abdomen: Soft, nontender, nondistended, positive bowel sounds, no masses or organomegaly noted. Extremities: No clubbing, cyanosis or edema, positive pulses. Neurologic: Grossly intact and nonfocal, I have not ambulated her.  Labs on Admission:  Results for orders placed or performed during the hospital encounter of 01/21/15 (from the past 48 hour(s))  CBC     Status: None   Collection Time: 01/21/15  9:24 AM  Result Value Ref Range   WBC 6.7  4.0 - 10.5 K/uL   RBC 4.26 3.87 - 5.11 MIL/uL   Hemoglobin 13.9 12.0 - 15.0 g/dL   HCT 40.7 36.0 - 46.0 %   MCV 95.5 78.0 - 100.0 fL   MCH 32.6 26.0 - 34.0 pg   MCHC 34.2 30.0 - 36.0 g/dL   RDW 14.0 11.5 - 15.5 %   Platelets 258 150 - 400 K/uL  Troponin I     Status: None   Collection Time: 01/21/15  9:24 AM  Result Value Ref Range   Troponin I <0.03 <0.031 ng/mL    Comment:        NO INDICATION OF MYOCARDIAL INJURY.   Protime-INR     Status: Abnormal   Collection Time: 01/21/15  9:24 AM  Result Value Ref Range   Prothrombin Time 37.3 (H) 11.6 - 15.2 seconds   INR 3.91 (H) 0.00 - 1.49  Comprehensive metabolic panel     Status: Abnormal   Collection Time: 01/21/15  9:24 AM  Result Value Ref Range   Sodium 133 (L) 135 - 145 mmol/L   Potassium 3.6 3.5 - 5.1 mmol/L   Chloride 96 (L) 101 - 111 mmol/L   CO2 27 22 - 32 mmol/L   Glucose, Bld 110 (H) 65 - 99 mg/dL   BUN 34 (H) 6 - 20 mg/dL   Creatinine, Ser 1.15 (H) 0.44 - 1.00 mg/dL   Calcium 8.8 (L) 8.9 - 10.3 mg/dL   Total Protein 7.2 6.5 - 8.1 g/dL   Albumin 4.0 3.5 - 5.0 g/dL   AST 33 15 - 41 U/L   ALT 24 14 - 54 U/L   Alkaline Phosphatase 49 38 - 126 U/L   Total Bilirubin 1.1 0.3 - 1.2 mg/dL   GFR calc non Af Amer 43 (L) >60 mL/min   GFR calc Af Amer 50 (L) >60 mL/min    Comment: (NOTE) The eGFR has been calculated using the CKD EPI equation. This calculation has not been validated in all clinical situations. eGFR's persistently <60 mL/min signify possible Chronic Kidney Disease.    Anion gap 10 5 - 15  Urinalysis, Routine w reflex microscopic (not at [facility])     Status: Abnormal   Collection Time: 01/21/15 11:19 AM  Result Value Ref Range   Color, Urine YELLOW YELLOW   APPearance CLEAR CLEAR   Specific Gravity, Urine 1.015 1.005 - 1.030   pH 5.5 5.0 - 8.0   Glucose, UA NEGATIVE NEGATIVE mg/dL   Hgb urine dipstick NEGATIVE NEGATIVE   Bilirubin Urine NEGATIVE NEGATIVE   Ketones, ur NEGATIVE  NEGATIVE mg/dL    Protein, ur TRACE (A) NEGATIVE mg/dL   Nitrite NEGATIVE NEGATIVE   Leukocytes, UA TRACE (A) NEGATIVE  Urine microscopic-add on     Status: Abnormal   Collection Time: 01/21/15 11:19 AM  Result Value Ref Range   Squamous Epithelial / LPF NONE SEEN NONE SEEN   WBC, UA 0-5 0 - 5 WBC/hpf   RBC / HPF NONE SEEN 0 - 5 RBC/hpf   Bacteria, UA MANY (A) NONE SEEN    Radiological Exams on Admission: Dg Abd Acute W/chest  01/21/2015  CLINICAL DATA:  Chest pressure.  Melanoma and breast cancer EXAM: DG ABDOMEN ACUTE W/ 1V CHEST COMPARISON:  08/13/2013 FINDINGS: Cardiac enlargement without heart failure. Lungs are clear. Negative for pneumonia or effusion. No mass or lung nodule. Right mastectomy. Moderate lumbar levoscoliosis. Surgical clips in the gallbladder fossa. Negative for bowel obstruction. Possible mild ileus. No free air. No renal calculi IMPRESSION: No active cardiopulmonary abnormality Mild increase in diffuse small bowel and large bowel gas most consistent with mild ileus. No obstruction or free air. Electronically Signed   By: Franchot Gallo M.D.   On: 01/21/2015 10:07    Assessment/Plan Principal Problem:   Chest pain Active Problems:   Mitral valve disease   Atrial fibrillation (HCC)   Long term (current) use of anticoagulants   Aortic valve disease   Chest pain -Patient's heart score is only 2 on account of her age. -She does not have a history of coronary artery disease, I doubt this represents ACS. -We'll admit as an observation to telemetry for cardiac rule out.  -If troponins remain negative and EKG does not show any changes, we'll elect to discharge home tomorrow morning with outpatient follow-up.  Atrial fibrillation -Continue Cardizem for rate control and Coumadin for anticoagulation.  DVT prophylaxis -On Coumadin  CODE STATUS -Full code    Time Spent on Admission: 75 minutes  HERNANDEZ ACOSTA,ESTELA [facility] Pager: [phone] 01/21/2015, 2:51  PM

## 2015-01-21 NOTE — Progress Notes (Addendum)
Patient has a 5 beat run of Vtach. Patient asymptomatic. Dr. Jerilee Hoh notified.

## 2015-01-21 NOTE — ED Notes (Signed)
Chest pain .  Rates a 9/10. Sob.  States she has a lot of chest pain ,  But it is worse the last few days.

## 2015-01-21 NOTE — Progress Notes (Signed)
Notified Md, patient [name] is in room 327 AP-300.

## 2015-01-21 NOTE — ED Provider Notes (Signed)
CSN: [id number]     Arrival date & time 01/21/15  [id number] History   First MD Initiated Contact with Patient 01/21/15 [phone]     Chief Complaint  Patient presents with  . Chest Pain     (Consider location/radiation/quality/duration/timing/severity/associated sxs/prior Treatment) The history is provided by the patient and a relative.   [name] is a 81 y.o. female with history outlined below, most significant for Afib on coumadin, mitral disease with history rheumatic heart disease presenting with acute no chronic chest pressure and sob.  She reports a 3 day history of worsened constant pressure which is not triggered or relieved by any particular activity or rest.  She also endorses having diarrhea last weekend, took an otc antidiarrheal, and now has had no bm in 4 days with increased abdominal distention and bloating.  She denies nausea, vomiting, diaphoresis or fever.  She endorses generalized fatigue.  She has not had any treatments prior to arrival for her chest pressure. She reports chronic lower leg swelling (bilateral) due to venous insufficiency. Her swelling is better than usual today.  Relative at bedside endorses less PO intake this week and is concerned for dehydration.    Past Medical History  Diagnosis Date  . Rheumatic heart disease   . IBS (irritable bowel syndrome)   . GERD (gastroesophageal reflux disease)   . Breast cancer ([place])   . Mitral regurgitation   . Mitral stenosis   . Atrial fibrillation ([place])   . PAT (paroxysmal atrial tachycardia) ([place])   . Hypothyroidism   . Melanoma of neck ([place])   . Microscopic colitis 2003    Was maintained on Asacol BID  . S/P colonoscopy April 2010    Friable anal canal, left-sided diverticula, adenomatous polyp  . S/P endoscopy April 2010    Benign polyp, s/p 54-F Maloney dilation  . Hiatal hernia   . Diverticula of colon   . Tubulovillous adenoma   . Adenomatous polyp   . Sleep apnea     CPAP at night  . History of cardiac  catheterization     Normal coronaries in 2006   Past Surgical History  Procedure Laterality Date  . Abdominal hysterectomy    . Melanoma removal      SURGERY NECK  . Cataracts    . Hemorrhoid surgery    . Tonsillectomy    . Colonoscopy  06/12/2008    Dr. Laurine Blazer anal canal, left-sided diverticula, adenomatous polyp  . Esophagogastroduodenoscopy  06/12/2008    Dr. Marice Potter polyp, s/p 54-F Venia Minks dilation  . Colonoscopy  09/28/2011    Dr. Gala Romney- tubular adenoma, colonic diverticulosis  . Cholecystectomy    . Mastectomy      RIGHT BREAST, followed by left breast after radiation  . Esophagogastroduodenoscopy (egd) with esophageal dilation N/A 02/28/2013    Dr. Rourk:Mild changes of reflux esophagitis. Status post passage of a Maloney dilator. Hiatal hernia. Gastric erosions and gastric polyps-, benign fundic gland polyp  . Esophagogastroduodenoscopy N/A 03/16/2014    Dr. Gala Romney: Suspect early GAVE. No known underlying liver diease. No NSAIDS reported. Patient could easlily be bleeding from her stomach. However. hemoglobin has been normal. A capsule not indicated at this time. Status post biopsies of gastric polyps, hyperplastic.    Family History  Problem Relation Age of Onset  . Stroke Mother   . Heart attack Father   . Breast cancer Sister   . Colon cancer Neg Hx    Social History  Substance Use Topics  .  Smoking status: Never Smoker   . Smokeless tobacco: Never Used  . Alcohol Use: No   OB History    No data available     Review of Systems  Constitutional: Negative for fever.  HENT: Negative for congestion and sore throat.   Eyes: Negative.   Respiratory: Positive for chest tightness and shortness of breath.   Cardiovascular: Positive for chest pain.       Negative except as mentioned in HPI.    Gastrointestinal: Positive for constipation and abdominal distention. Negative for nausea and vomiting.  Genitourinary: Negative.        Reports decreased urination.   Musculoskeletal: Negative for joint swelling, arthralgias and neck pain.  Skin: Negative.  Negative for rash and wound.  Neurological: Negative for dizziness, weakness, light-headedness, numbness and headaches.  Psychiatric/Behavioral: Negative.       Allergies  Amitiza; Ciprofloxacin; Clindamycin/lincomycin; Doxycycline; Erwinaze; Linzess; and Sulfonamide derivatives  Home Medications   Prior to Admission medications   Medication Sig Start Date End Date Taking? Authorizing Provider  ALPRAZolam Duanne Moron) 0.5 MG tablet Take 0.5 mg by mouth at bedtime as needed. Sleep 06/22/11  Yes Historical Provider, MD  CALCIUM PO Take 1 tablet by mouth daily.   Yes Historical Provider, MD  Cholecalciferol (VITAMIN D-3 PO) Take 1 tablet by mouth daily.   Yes Historical Provider, MD  diltiazem (CARDIZEM CD) 300 MG 24 hr capsule TAKE (1) CAPSULE BY MOUTH ONCE DAILY. 01/05/15  Yes Satira Sark, MD  estradiol (ESTRACE) 0.1 MG/GM vaginal cream Place 1 Applicatorful vaginally at bedtime. As needed   Yes Historical Provider, MD  furosemide (LASIX) 20 MG tablet Take 20 mg by mouth 2 (two) times daily.   Yes Historical Provider, MD  HYDROcodone-acetaminophen (NORCO) 10-325 MG per tablet Take 1 tablet by mouth every 6 (six) hours as needed for moderate pain.    Yes Historical Provider, MD  hydrocortisone (CORTEF) 10 MG tablet Take 10 mg by mouth 2 (two) times daily.    Yes Historical Provider, MD  hydrocortisone cream 1 % Apply 1 application topically daily as needed for itching. 03/01/12  Yes Nimish Luther Parody, MD  levothyroxine (SYNTHROID, LEVOTHROID) 100 MCG tablet Take 100 mcg by mouth daily before breakfast.   Yes Historical Provider, MD  mirabegron ER (MYRBETRIQ) 25 MG TB24 tablet Take 25 mg by mouth daily.   Yes Historical Provider, MD  ondansetron (ZOFRAN) 4 MG tablet Take 1 tablet (4 mg total) by mouth every 8 (eight) hours as needed for nausea or vomiting. 08/19/14  Yes Orvil Feil, NP  Probiotic Product  (ALIGN PO) Take 1 tablet by mouth daily.   Yes Historical Provider, MD  propranolol ER (INDERAL LA) 80 MG 24 hr capsule Take 80 mg by mouth daily.   Yes Historical Provider, MD  spironolactone (ALDACTONE) 25 MG tablet TAKE 1 TABLET BY MOUTH ONCE DAILY. 08/27/14  Yes Satira Sark, MD  warfarin (COUMADIN) 2.5 MG tablet Take 1/2 tablet daily except 1 tablet on Mondays and Fridays or as directed 12/30/14  Yes Satira Sark, MD  NEXIUM 40 MG capsule TAKE ONE CAPSULE BY MOUTH DAILY BEFORE BREAKFAST. Patient not taking: Reported on 01/21/2015 05/27/14   Mahala Menghini, PA-C   BP 105/53 mmHg  Pulse 63  Temp(Src) 97.8 F (36.6 C) (Oral)  Resp 16  Ht 5\' 5"  (1.651 m)  Wt 46.811 kg  BMI 17.17 kg/m2  SpO2 94% Physical Exam  Constitutional: She appears well-developed and well-nourished.  HENT:  Head: Normocephalic and atraumatic.  Mouth/Throat: Mucous membranes are dry.  Eyes: Conjunctivae are normal.  Neck: Normal range of motion.  Cardiovascular: Normal rate and intact distal pulses.  An irregularly irregular rhythm present.  Pulmonary/Chest: Effort normal and breath sounds normal. She has no wheezes. She has no rhonchi. She has no rales.  Abdominal: Soft. Bowel sounds are normal. She exhibits distension. There is tenderness in the right lower quadrant.  ttp rlq.  Generalized distention, soft, increased tympany.  No fluid wave, no palpable pulsatile mass.  Musculoskeletal: Normal range of motion.  Bilateral ankle edema.  Neurological: She is alert.  Skin: Skin is warm and dry.  Psychiatric: She has a normal mood and affect.  Nursing note and vitals reviewed.   ED Course  Procedures (including critical care time) Labs Review Labs Reviewed  PROTIME-INR - Abnormal; Notable for the following:    Prothrombin Time 37.3 (*)    INR 3.91 (*)    All other components within normal limits  COMPREHENSIVE METABOLIC PANEL - Abnormal; Notable for the following:    Sodium 133 (*)    Chloride 96  (*)    Glucose, Bld 110 (*)    BUN 34 (*)    Creatinine, Ser 1.15 (*)    Calcium 8.8 (*)    GFR calc non Af Amer 43 (*)    GFR calc Af Amer 50 (*)    All other components within normal limits  URINALYSIS, ROUTINE W REFLEX MICROSCOPIC (NOT AT [facility]) - Abnormal; Notable for the following:    Protein, ur TRACE (*)    Leukocytes, UA TRACE (*)    All other components within normal limits  URINE MICROSCOPIC-ADD ON - Abnormal; Notable for the following:    Bacteria, UA MANY (*)    All other components within normal limits  CBC  TROPONIN I    Imaging Review Dg Abd Acute W/chest  01/21/2015  CLINICAL DATA:  Chest pressure.  Melanoma and breast cancer EXAM: DG ABDOMEN ACUTE W/ 1V CHEST COMPARISON:  08/13/2013 FINDINGS: Cardiac enlargement without heart failure. Lungs are clear. Negative for pneumonia or effusion. No mass or lung nodule. Right mastectomy. Moderate lumbar levoscoliosis. Surgical clips in the gallbladder fossa. Negative for bowel obstruction. Possible mild ileus. No free air. No renal calculi IMPRESSION: No active cardiopulmonary abnormality Mild increase in diffuse small bowel and large bowel gas most consistent with mild ileus. No obstruction or free air. Electronically Signed   By: Franchot Gallo M.D.   On: 01/21/2015 10:07   I have personally reviewed and evaluated these images and lab results as part of my medical decision-making.   EKG Interpretation   Date/Time:  Thursday January 21 2015 08:51:48 EST Ventricular Rate:  69 PR Interval:    QRS Duration: 70 QT Interval:  424 QTC Calculation: 454 R Axis:   -32 Text Interpretation:  Atrial fibrillation Left axis deviation When  compared with ECG of 08/13/2013 No significant change was found Confirmed  by [facility]  MD, Nunzio Cory [phone]) on 01/21/2015 9:15:59 AM      MDM   Final diagnoses:  Chest pain, unspecified chest pain type    Discussed with Dr. Jerilee Hoh.  Admit for overnight obs tele.  Temp admission orders  placed.    Evalee Jefferson, PA-C 01/21/15 [place], DO 01/24/15 1728

## 2015-01-21 NOTE — ED Notes (Signed)
Pt taken to XR.  

## 2015-01-21 NOTE — ED Notes (Signed)
Julie, PA at bedside.

## 2015-01-21 NOTE — ED Notes (Signed)
Pt. returned from XR. 

## 2015-01-22 DIAGNOSIS — I481 Persistent atrial fibrillation: Secondary | ICD-10-CM

## 2015-01-22 DIAGNOSIS — R0789 Other chest pain: Secondary | ICD-10-CM | POA: Diagnosis not present

## 2015-01-22 DIAGNOSIS — Z7901 Long term (current) use of anticoagulants: Secondary | ICD-10-CM | POA: Diagnosis not present

## 2015-01-22 LAB — PROTIME-INR
INR: 3.32 — AB (ref 0.00–1.49)
Prothrombin Time: 33 seconds — ABNORMAL HIGH (ref 11.6–15.2)

## 2015-01-22 MED ORDER — WARFARIN - PHARMACIST DOSING INPATIENT: Status: DC

## 2015-01-22 MED ORDER — ALPRAZOLAM 0.5 MG PO TABS: 0.5000 mg | ORAL_TABLET | Freq: Every evening | ORAL | Status: AC | PRN

## 2015-01-22 NOTE — Progress Notes (Signed)
ANTICOAGULATION CONSULT NOTE - follow up  Pharmacy Consult for coumadin Indication: atrial fibrillation  Allergies  Allergen Reactions  . Amitiza [Lubiprostone] Swelling  . Ciprofloxacin Nausea And Vomiting  . Clindamycin/Lincomycin     Nauseated/headache  . Doxycycline     Nauseated/headache  . Erwinaze [Asparaginase Erwinia]     Told not to take by dr  . Rolan Lipa [Linaclotide] Swelling and Rash  . Sulfonamide Derivatives Nausea And Vomiting and Rash   Patient Measurements: Height: 5\' 5"  (165.1 cm) Weight: 103 lb 3.2 oz (46.811 kg) IBW/kg (Calculated) : 57  Vital Signs: Temp: 97.9 F (36.6 C) (12/02 0608) Temp Source: Oral (12/02 0608) BP: 122/51 mmHg (12/02 0846) Pulse Rate: 66 (12/02 0846)  Labs:  Recent Labs  01/21/15 0924 01/21/15 1518 01/21/15 1810 01/21/15 2035 01/22/15 0633  HGB 13.9  --   --   --   --   HCT 40.7  --   --   --   --   PLT 258  --   --   --   --   LABPROT 37.3*  --   --   --  33.0*  INR 3.91*  --   --   --  3.32*  CREATININE 1.15*  --   --   --   --   TROPONINI <0.03 <0.03 <0.03 <0.03  --    Estimated Creatinine Clearance: 27.4 mL/min (by C-G formula based on Cr of 1.15).  Medical History: Past Medical History  Diagnosis Date  . Rheumatic heart disease   . IBS (irritable bowel syndrome)   . GERD (gastroesophageal reflux disease)   . Breast cancer ([place])   . Mitral regurgitation   . Mitral stenosis   . Atrial fibrillation ([place])   . PAT (paroxysmal atrial tachycardia) ([place])   . Hypothyroidism   . Melanoma of neck ([place])   . Microscopic colitis 2003    Was maintained on Asacol BID  . S/P colonoscopy April 2010    Friable anal canal, left-sided diverticula, adenomatous polyp  . S/P endoscopy April 2010    Benign polyp, s/p 54-F Maloney dilation  . Hiatal hernia   . Diverticula of colon   . Tubulovillous adenoma   . Adenomatous polyp   . Sleep apnea     CPAP at night  . History of cardiac catheterization     Normal coronaries  in 2006   Medications:  Prescriptions prior to admission  Medication Sig Dispense Refill Last Dose  . CALCIUM PO Take 1 tablet by mouth daily.   01/20/2015 at Unknown time  . Cholecalciferol (VITAMIN D-3 PO) Take 1 tablet by mouth daily.   01/20/2015 at Unknown time  . diltiazem (CARDIZEM CD) 300 MG 24 hr capsule TAKE (1) CAPSULE BY MOUTH ONCE DAILY. 30 capsule 9 01/20/2015 at Unknown time  . estradiol (ESTRACE) 0.1 MG/GM vaginal cream Place 1 Applicatorful vaginally at bedtime. As needed   unknown  . furosemide (LASIX) 20 MG tablet Take 20 mg by mouth 2 (two) times daily.   01/20/2015 at Unknown time  . HYDROcodone-acetaminophen (NORCO) 10-325 MG per tablet Take 1 tablet by mouth every 6 (six) hours as needed for moderate pain.    01/20/2015 at Unknown time  . hydrocortisone (CORTEF) 10 MG tablet Take 10 mg by mouth 2 (two) times daily.    01/20/2015 at Unknown time  . hydrocortisone cream 1 % Apply 1 application topically daily as needed for itching.   unknown  . levothyroxine (SYNTHROID, LEVOTHROID) 100 MCG  tablet Take 100 mcg by mouth daily before breakfast.   01/20/2015 at Unknown time  . mirabegron ER (MYRBETRIQ) 25 MG TB24 tablet Take 25 mg by mouth daily.   01/20/2015 at Unknown time  . ondansetron (ZOFRAN) 4 MG tablet Take 1 tablet (4 mg total) by mouth every 8 (eight) hours as needed for nausea or vomiting. 30 tablet 1 unknown  . Probiotic Product (ALIGN PO) Take 1 tablet by mouth daily.   01/20/2015 at Unknown time  . propranolol ER (INDERAL LA) 80 MG 24 hr capsule Take 80 mg by mouth daily.   01/20/2015 at Unknown time  . spironolactone (ALDACTONE) 25 MG tablet TAKE 1 TABLET BY MOUTH ONCE DAILY. 90 tablet 3 01/20/2015 at Unknown time  . warfarin (COUMADIN) 2.5 MG tablet Take 1/2 tablet daily except 1 tablet on Mondays and Fridays or as directed 30 tablet 6 01/20/2015 at Unknown time  . [DISCONTINUED] ALPRAZolam (XANAX) 0.5 MG tablet Take 0.5 mg by mouth at bedtime as needed. Sleep    unknown  . NEXIUM 40 MG capsule TAKE ONE CAPSULE BY MOUTH DAILY BEFORE BREAKFAST. (Patient not taking: Reported on 01/21/2015) 30 capsule 11 Taking   Assessment: 81 yo lady to continue coumadin for afib.  INR is elevated on admission and remains elevated today although it is trending down toward target.   Goal of Therapy:  INR 2-3 Monitor platelets by anticoagulation protocol: Yes   Plan:  Hold coumadin today Daily PT/INR Monitor for bleeding complications.  Thanks for allowing pharmacy to be a part of this patient's care.  Hart Robinsons, PharmD Clinical Pharmacist 01/22/2015,10:58 AM

## 2015-01-22 NOTE — Final Progress Note (Signed)
Patient discharged with instructions, prescription, and care notes.  Verbalized understanding via teach back.  IV was removed and the site was WNL. Patient voiced no further complaints or concerns at the time of discharge.  Appointments scheduled per instructions.  Patient left the floor via w/c with staff and family in stable condition. 

## 2015-01-22 NOTE — Care Management Note (Signed)
Case Management Note  Patient Details  Name: [name] MRN: [id number] Date of Birth: 1929/10/21  Subjective/Objective:                  Pt admitted from home with CP. Pt lives alone and will return home at discharge. Pt is independent with ADL's. Pt has neighbors who are very active in the care of the pt.  Action/Plan: Pt discharged home today. No Cm needs noted.  Expected Discharge Date:                  Expected Discharge Plan:  Home/Self Care  In-House Referral:  NA  Discharge planning Services  CM Consult  Post Acute Care Choice:  NA Choice offered to:  NA  DME Arranged:    DME Agency:     HH Arranged:    HH Agency:     Status of Service:  Completed, signed off  Medicare Important Message Given:    Date Medicare IM Given:    Medicare IM give by:    Date Additional Medicare IM Given:    Additional Medicare Important Message give by:     If discussed at [place] of Stay Meetings, dates discussed:    Additional Comments:  Joylene Draft, RN 01/22/2015, 1:19 PM

## 2015-01-22 NOTE — Care Management Obs Status (Signed)
[facility] NOTIFICATION   Patient Details  Name: KALANIE YOSS MRN: [id number] Date of Birth: 1929-11-05   Medicare Observation Status Notification Given:  Yes    Joylene Draft, RN 01/22/2015, 12:05 PM

## 2015-01-22 NOTE — Discharge Summary (Signed)
Physician Discharge Summary  [name] [id number] DOB: Mar 29, 1929 DOA: 01/21/2015  PCP: [name], MD  Admit date: 01/21/2015 Discharge date: 01/22/2015  Time spent: 45 minutes  Recommendations for Outpatient Follow-up:  -We'll be discharged home today. -Advised to follow-up with PCP in 2 weeks.   Discharge Diagnoses:  Principal Problem:   Chest pain Active Problems:   Mitral valve disease   Atrial fibrillation (HCC)   Long term (current) use of anticoagulants   Aortic valve disease   Discharge Condition: Stable and improved  Filed Weights   01/21/15 1201  Weight: 46.811 kg (103 lb 3.2 oz)    History of present illness:  Patient is an 81 year old woman with history of atrial fibrillation chronically anticoagulated on Coumadin, history of rheumatic heart disease. She appears quite nervous and anxious on exam today. She states that for the past 3 days she has been experiencing increasing sensation of anxiety while being at home alone. She also describes pain over her left chest without radiation, today she decided to come into the hospital for evaluation. Troponin was negative in the emergency department, EKG showed atrial fibrillation at a controlled rate. We are asked to admit her for further evaluation and management.  Hospital Course:   Chest pain -Has ruled out for acute coronary syndrome with negative troponins and EKG without acute ischemic abnormalities. -Suspect a large degree of anxiety is likely playing a role. -Have given her 10 tablets of her Xanax but have requested that she follow-up with her primary care physician for further refills.  Atrial fibrillation -Rate controlled, anticoagulated on Coumadin.  Procedures:  None   Consultations:  None  Discharge Instructions  Discharge Instructions    Diet - low sodium heart healthy    Complete by:  As directed      Increase activity slowly    Complete by:  As directed               Medication List    STOP taking these medications        NEXIUM 40 MG capsule  Generic drug:  esomeprazole      TAKE these medications        ALIGN PO  Take 1 tablet by mouth daily.     ALPRAZolam 0.5 MG tablet  Commonly known as:  XANAX  Take 1 tablet (0.5 mg total) by mouth at bedtime as needed. Sleep     CALCIUM PO  Take 1 tablet by mouth daily.     diltiazem 300 MG 24 hr capsule  Commonly known as:  CARDIZEM CD  TAKE (1) CAPSULE BY MOUTH ONCE DAILY.     estradiol 0.1 MG/GM vaginal cream  Commonly known as:  ESTRACE  Place 1 Applicatorful vaginally at bedtime. As needed     furosemide 20 MG tablet  Commonly known as:  LASIX  Take 20 mg by mouth 2 (two) times daily.     HYDROcodone-acetaminophen 10-325 MG tablet  Commonly known as:  NORCO  Take 1 tablet by mouth every 6 (six) hours as needed for moderate pain.     hydrocortisone 10 MG tablet  Commonly known as:  CORTEF  Take 10 mg by mouth 2 (two) times daily.     hydrocortisone cream 1 %  Apply 1 application topically daily as needed for itching.     levothyroxine 100 MCG tablet  Commonly known as:  SYNTHROID, LEVOTHROID  Take 100 mcg by mouth daily before breakfast.     MYRBETRIQ  25 MG Tb24 tablet  Generic drug:  mirabegron ER  Take 25 mg by mouth daily.     ondansetron 4 MG tablet  Commonly known as:  ZOFRAN  Take 1 tablet (4 mg total) by mouth every 8 (eight) hours as needed for nausea or vomiting.     propranolol ER 80 MG 24 hr capsule  Commonly known as:  INDERAL LA  Take 80 mg by mouth daily.     spironolactone 25 MG tablet  Commonly known as:  ALDACTONE  TAKE 1 TABLET BY MOUTH ONCE DAILY.     VITAMIN D-3 PO  Take 1 tablet by mouth daily.     warfarin 2.5 MG tablet  Commonly known as:  COUMADIN  Take 1/2 tablet daily except 1 tablet on Mondays and Fridays or as directed       Allergies  Allergen Reactions  . Amitiza [Lubiprostone] Swelling  . Ciprofloxacin Nausea And Vomiting  .  Clindamycin/Lincomycin     Nauseated/headache  . Doxycycline     Nauseated/headache  . Erwinaze [Asparaginase Erwinia]     Told not to take by dr  . Rolan Lipa [Linaclotide] Swelling and Rash  . Sulfonamide Derivatives Nausea And Vomiting and Rash       Follow-up Information    Follow up with [name], MD. Schedule an appointment as soon as possible for a visit in 2 weeks.   Specialty:  Internal Medicine   Contact information:   [address] [place] [id number] [phone]        The results of significant diagnostics from this hospitalization (including imaging, microbiology, ancillary and laboratory) are listed below for reference.    Significant Diagnostic Studies: Dg Abd Acute W/chest  01/21/2015  CLINICAL DATA:  Chest pressure.  Melanoma and breast cancer EXAM: DG ABDOMEN ACUTE W/ 1V CHEST COMPARISON:  08/13/2013 FINDINGS: Cardiac enlargement without heart failure. Lungs are clear. Negative for pneumonia or effusion. No mass or lung nodule. Right mastectomy. Moderate lumbar levoscoliosis. Surgical clips in the gallbladder fossa. Negative for bowel obstruction. Possible mild ileus. No free air. No renal calculi IMPRESSION: No active cardiopulmonary abnormality Mild increase in diffuse small bowel and large bowel gas most consistent with mild ileus. No obstruction or free air. Electronically Signed   By: Franchot Gallo M.D.   On: 01/21/2015 10:07    Microbiology: No results found for this or any previous visit (from the past 240 hour(s)).   Labs: Basic Metabolic Panel:  Recent Labs Lab 01/21/15 0924  NA 133*  K 3.6  CL 96*  CO2 27  GLUCOSE 110*  BUN 34*  CREATININE 1.15*  CALCIUM 8.8*   Liver Function Tests:  Recent Labs Lab 01/21/15 0924  AST 33  ALT 24  ALKPHOS 49  BILITOT 1.1  PROT 7.2  ALBUMIN 4.0   No results for input(s): LIPASE, AMYLASE in the last 168 hours. No results for input(s): AMMONIA in the last 168 hours. CBC:  Recent  Labs Lab 01/21/15 0924  WBC 6.7  HGB 13.9  HCT 40.7  MCV 95.5  PLT 258   Cardiac Enzymes:  Recent Labs Lab 01/21/15 0924 01/21/15 1518 01/21/15 1810 01/21/15 2035  TROPONINI <0.03 <0.03 <0.03 <0.03   BNP: BNP (last 3 results) No results for input(s): BNP in the last 8760 hours.  ProBNP (last 3 results) No results for input(s): PROBNP in the last 8760 hours.  CBG: No results for input(s): GLUCAP in the last 168 hours.     Signed:  Lelon Frohlich  [facility] Pager: [phone] 01/22/2015, 10:57 AM

## 2015-01-25 ENCOUNTER — Ambulatory Visit: Payer: Medicare Other | Admitting: Cardiology

## 2015-02-04 ENCOUNTER — Ambulatory Visit: Payer: Medicare Other | Admitting: Cardiology

## 2015-02-11 ENCOUNTER — Ambulatory Visit: Payer: Self-pay | Admitting: *Deleted

## 2015-02-11 DIAGNOSIS — I4891 Unspecified atrial fibrillation: Secondary | ICD-10-CM

## 2015-02-11 DIAGNOSIS — Z5181 Encounter for therapeutic drug level monitoring: Secondary | ICD-10-CM

## 2015-02-21 DEATH — deceased

## 2015-02-24 ENCOUNTER — Ambulatory Visit: Payer: Medicare Other | Admitting: Cardiology

## 2015-02-25 ENCOUNTER — Ambulatory Visit: Payer: Medicare Other | Admitting: Cardiology

## 2016-02-29 IMAGING — US US ABDOMEN COMPLETE W/ ELASTOGRAPHY
1 series · 13 of 25 positions shown · non-contrast
Comparison: None.

CLINICAL DATA: Chronic abdominal pain, nausea/ vomiting, weight
loss, fatty liver.



[Series 1: us abdomen complete w/ elastography · 0.21mm/px · 13 of 75 slices shown]
[im 1/75]
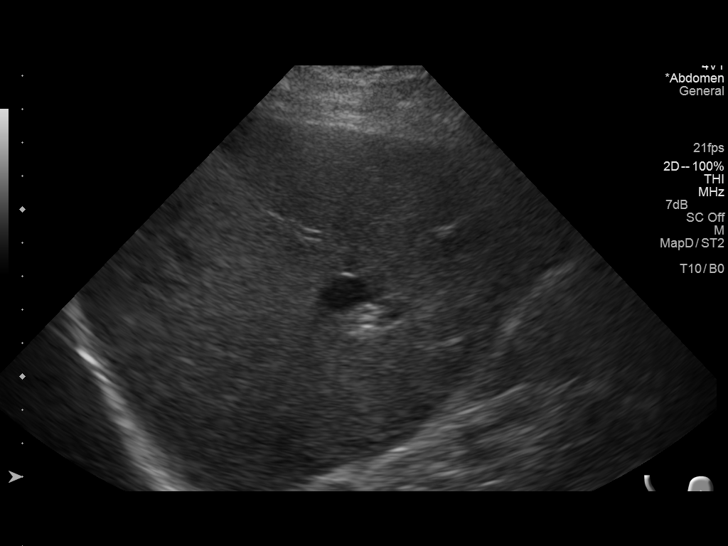
[im 7/75]
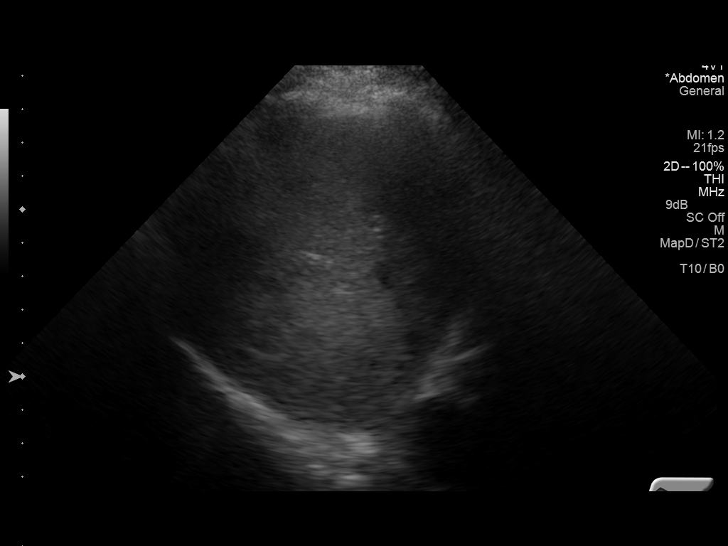
[im 13/75]
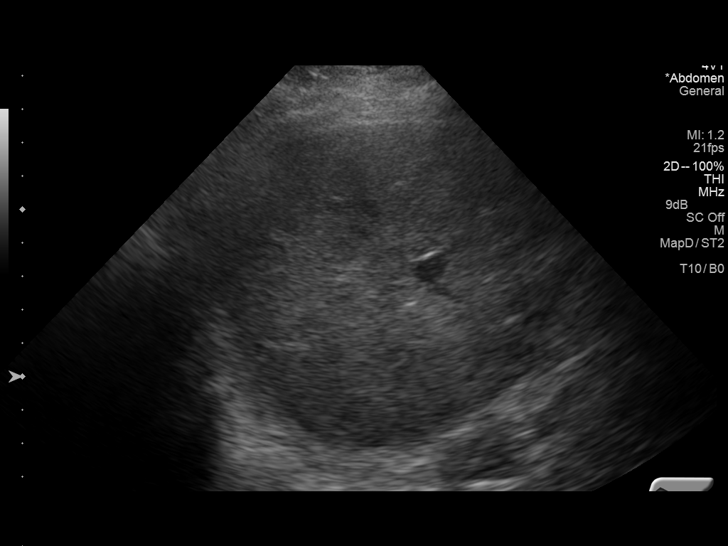
[im 19/75]
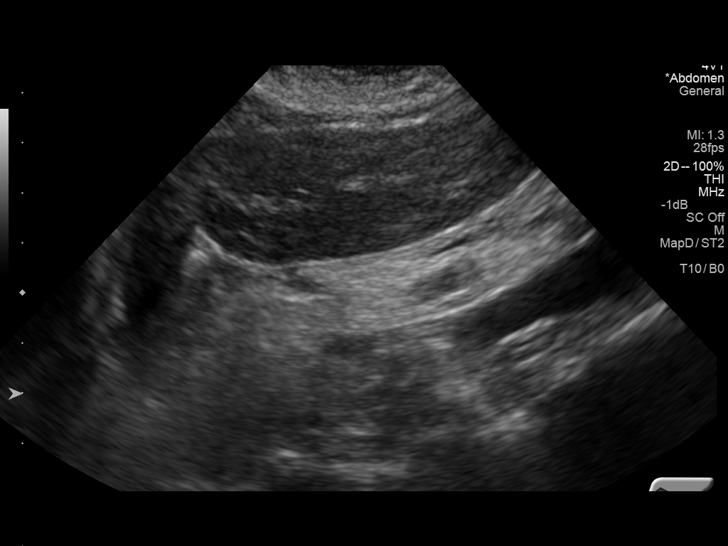
[im 25/75]
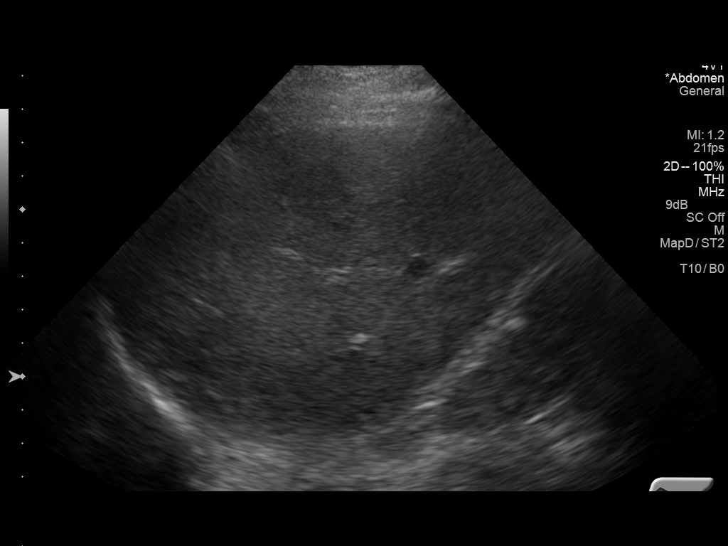
[im 31/75]
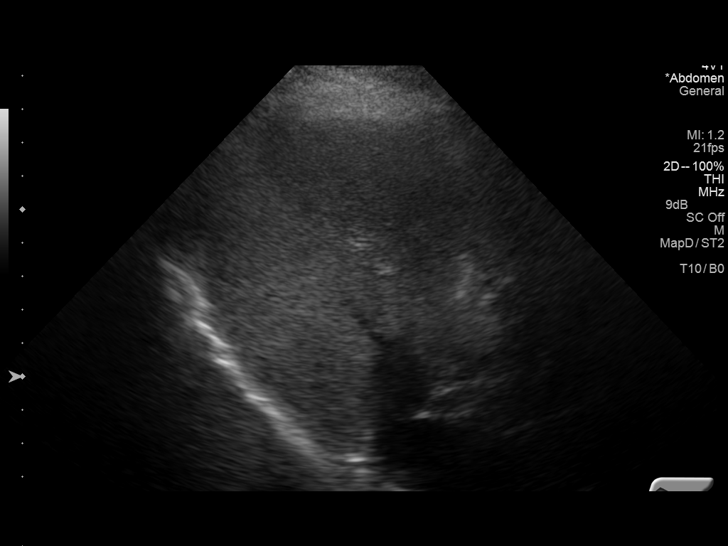
[im 38/75]
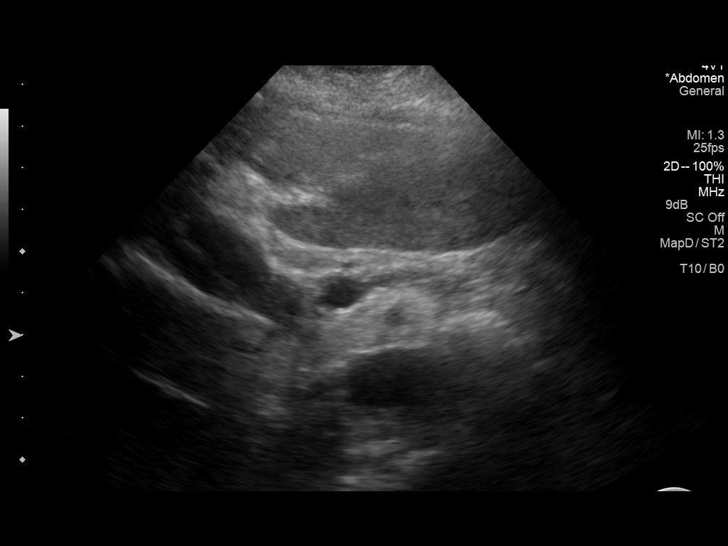
[im 44/75]
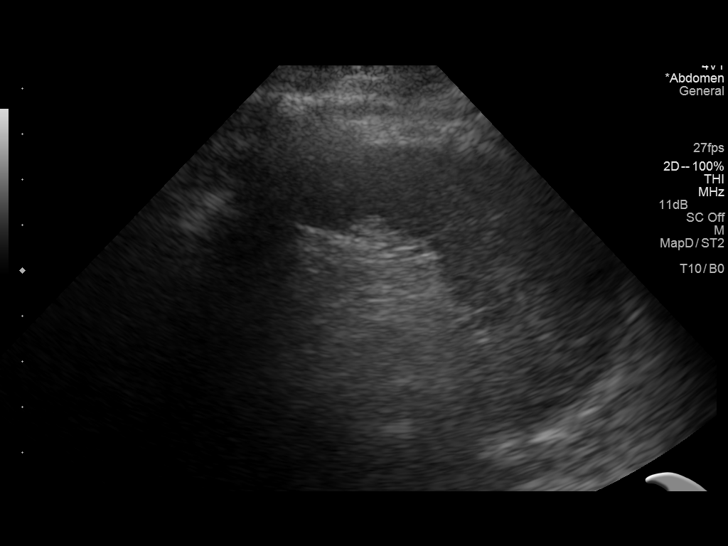
[im 50/75]
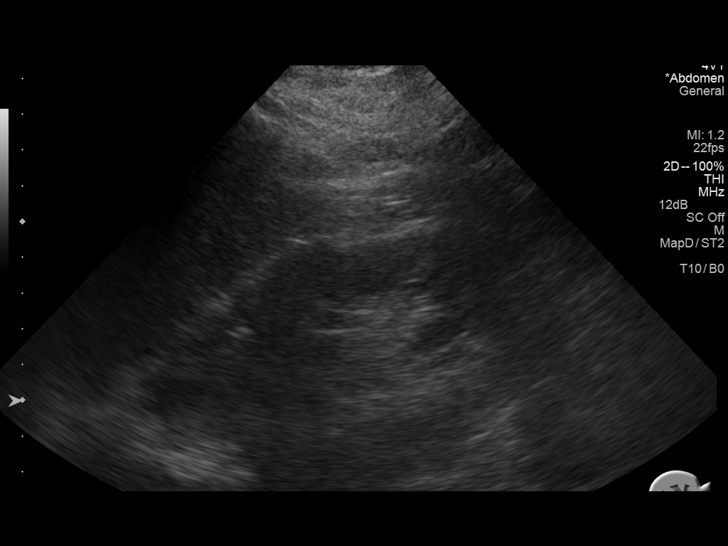
[im 56/75]
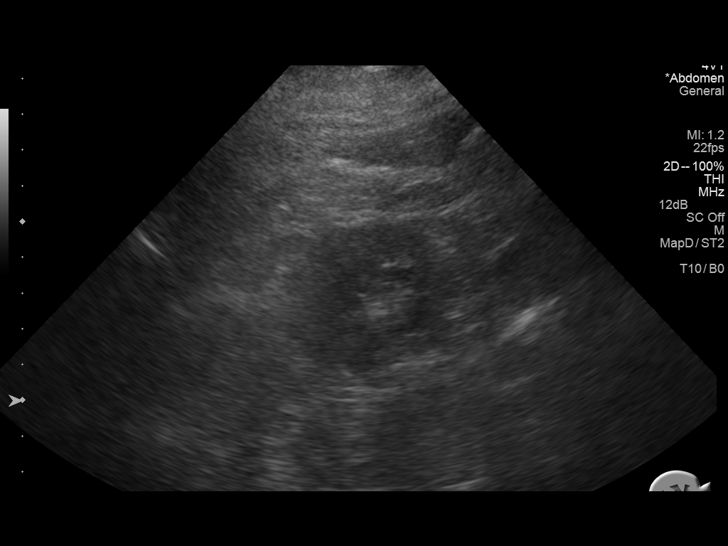
[im 62/75]
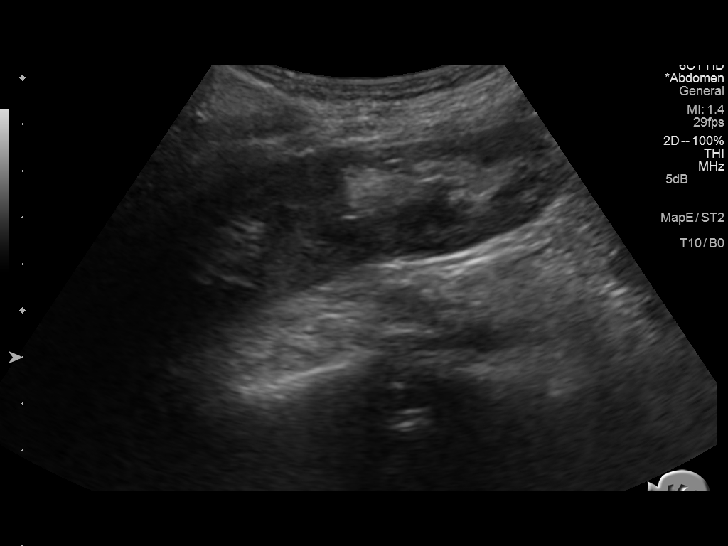
[im 68/75]
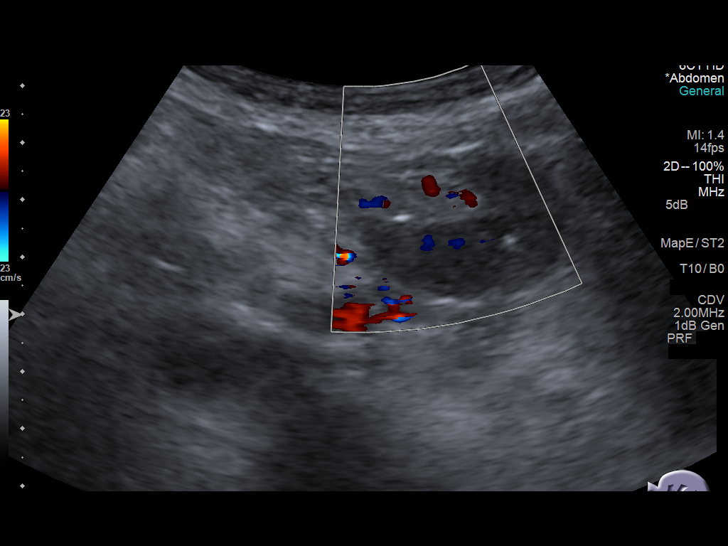
[im 75/75]
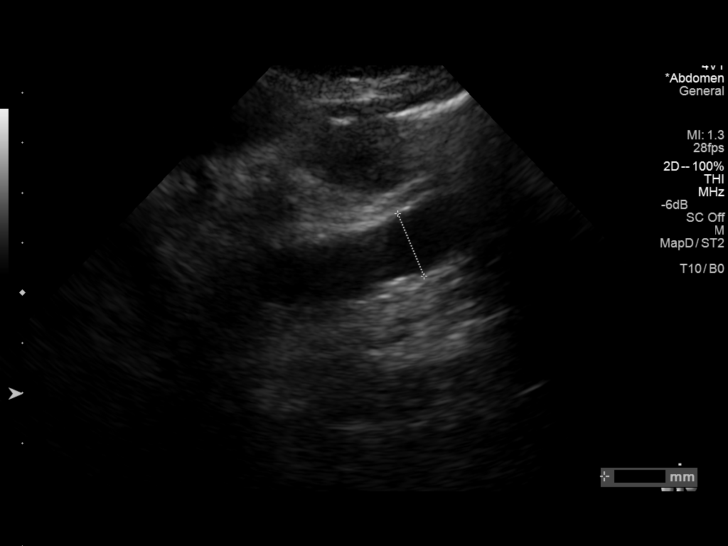

[13 of 25 positions shown; findings below may reference images not displayed]

FINDINGS: ULTRASOUND ABDOMEN

Gallbladder: Surgically absent.

Common bile duct: Diameter: 3 mm

Liver: Coarse hepatic echotexture with increased parenchymal
echogenicity.

IVC: No abnormality visualized.

Pancreas: Visualized portion unremarkable.

Spleen: Size and appearance within normal limits.

Right Kidney: Length: 11.3 cm.. Poorly visualized. No mass or
hydronephrosis.

Left Kidney: Length: 10.2 cm. Poorly visualized. No mass or
hydronephrosis.

Abdominal aorta: No aneurysm visualized.

Other findings: None.

ULTRASOUND HEPATIC ELASTOGRAPHY

Device: Siemens Helix VTQ

Transducer [id number]

Patient position: Left lateral decubitus

Number of measurements:  8

Hepatic Segment:  8

Median velocity:   1.23  m/sec

IQR:

IQR/Median velocity ratio

Corresponding Metavir fibrosis score:  F2/F3

Risk of fibrosis: Moderate

Limitations of exam: Difficulty with breath holding

Pertinent findings noted on other imaging exams:  None

Please note that abnormal shear wave velocities may also be
identified in clinical settings other than with hepatic fibrosis,
such as: acute hepatitis, elevated right heart and central venous
pressures including use of beta blockers, Russo disease
([place]), infiltrative processes such as
mastocytosis/amyloidosis/infiltrative tumor, extrahepatic
cholestasis, in the post-prandial state, and liver transplantation.
Correlation with patient history, laboratory data, and clinical
condition recommended.
IMPRESSION: Coarse echotexture with increased parenchymal echogenicity,
suggesting hepatic steatosis, underlying early cirrhosis not
excluded.

Median hepatic shear wave velocity is calculated at 1.23 m/sec.

Corresponding Metavir fibrosis score is F2/F3.

Risk of fibrosis is moderate.

Follow-up:  Additional testing appropriate.

## 2017-01-01 IMAGING — DX DG ABDOMEN ACUTE W/ 1V CHEST
3 series · 3 of 3 positions shown · non-contrast
Comparison: 08/13/2013

CLINICAL DATA: Chest pressure.  Melanoma and breast cancer

EXAM:
DG ABDOMEN ACUTE W/ 1V CHEST

[chest pa]
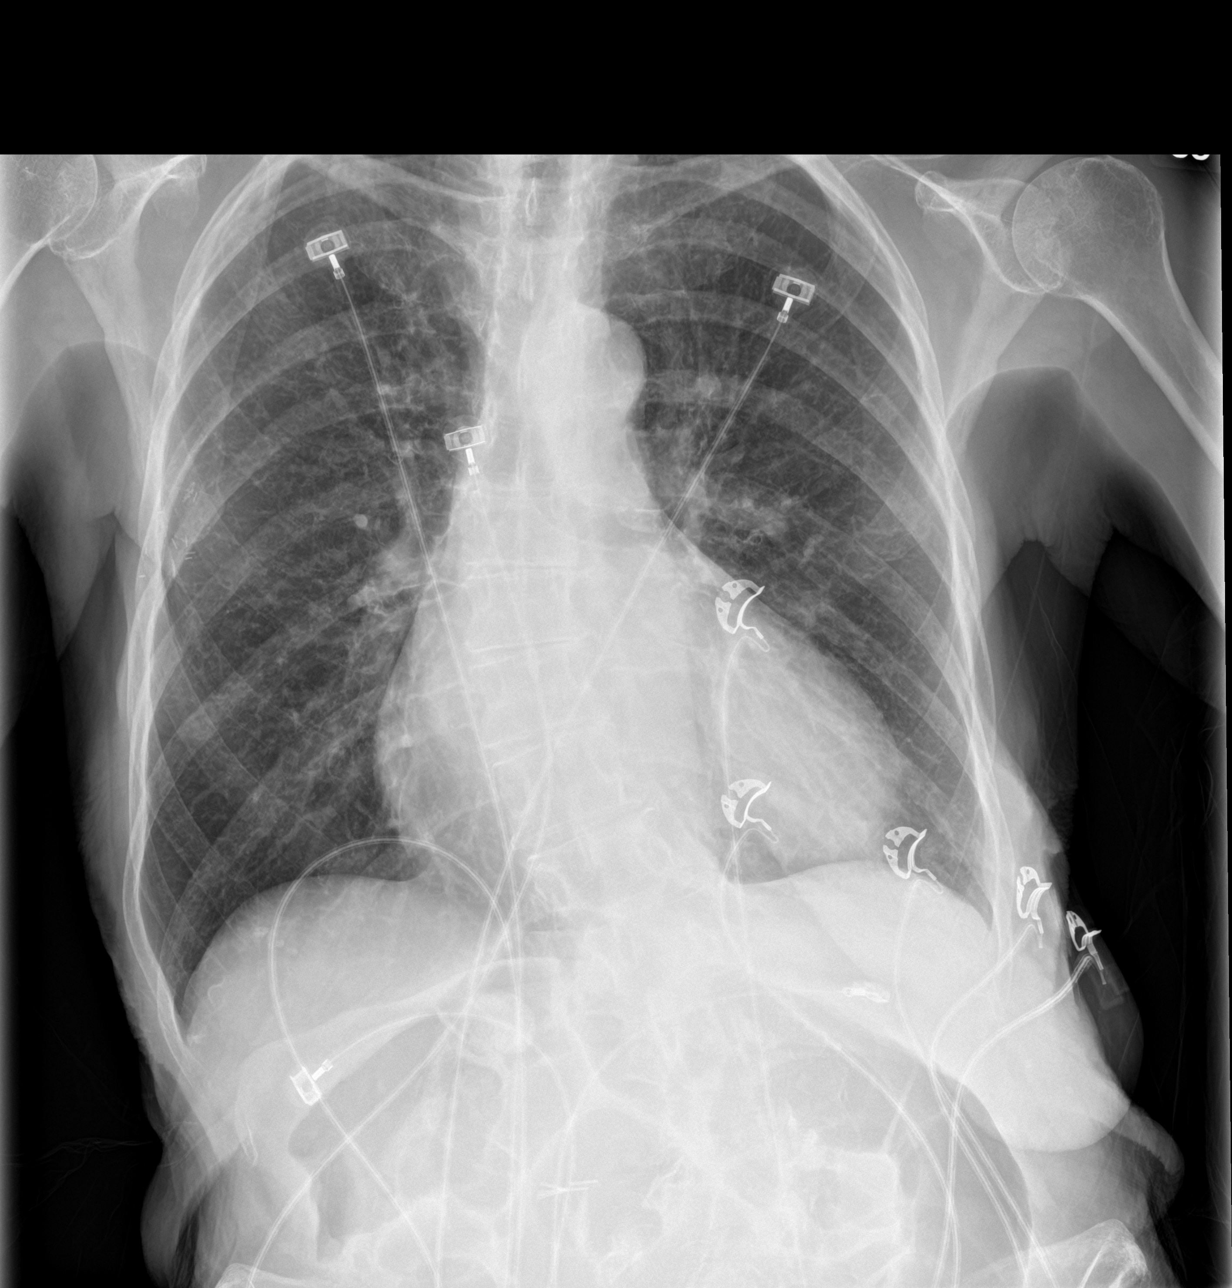

[abdomen erect]
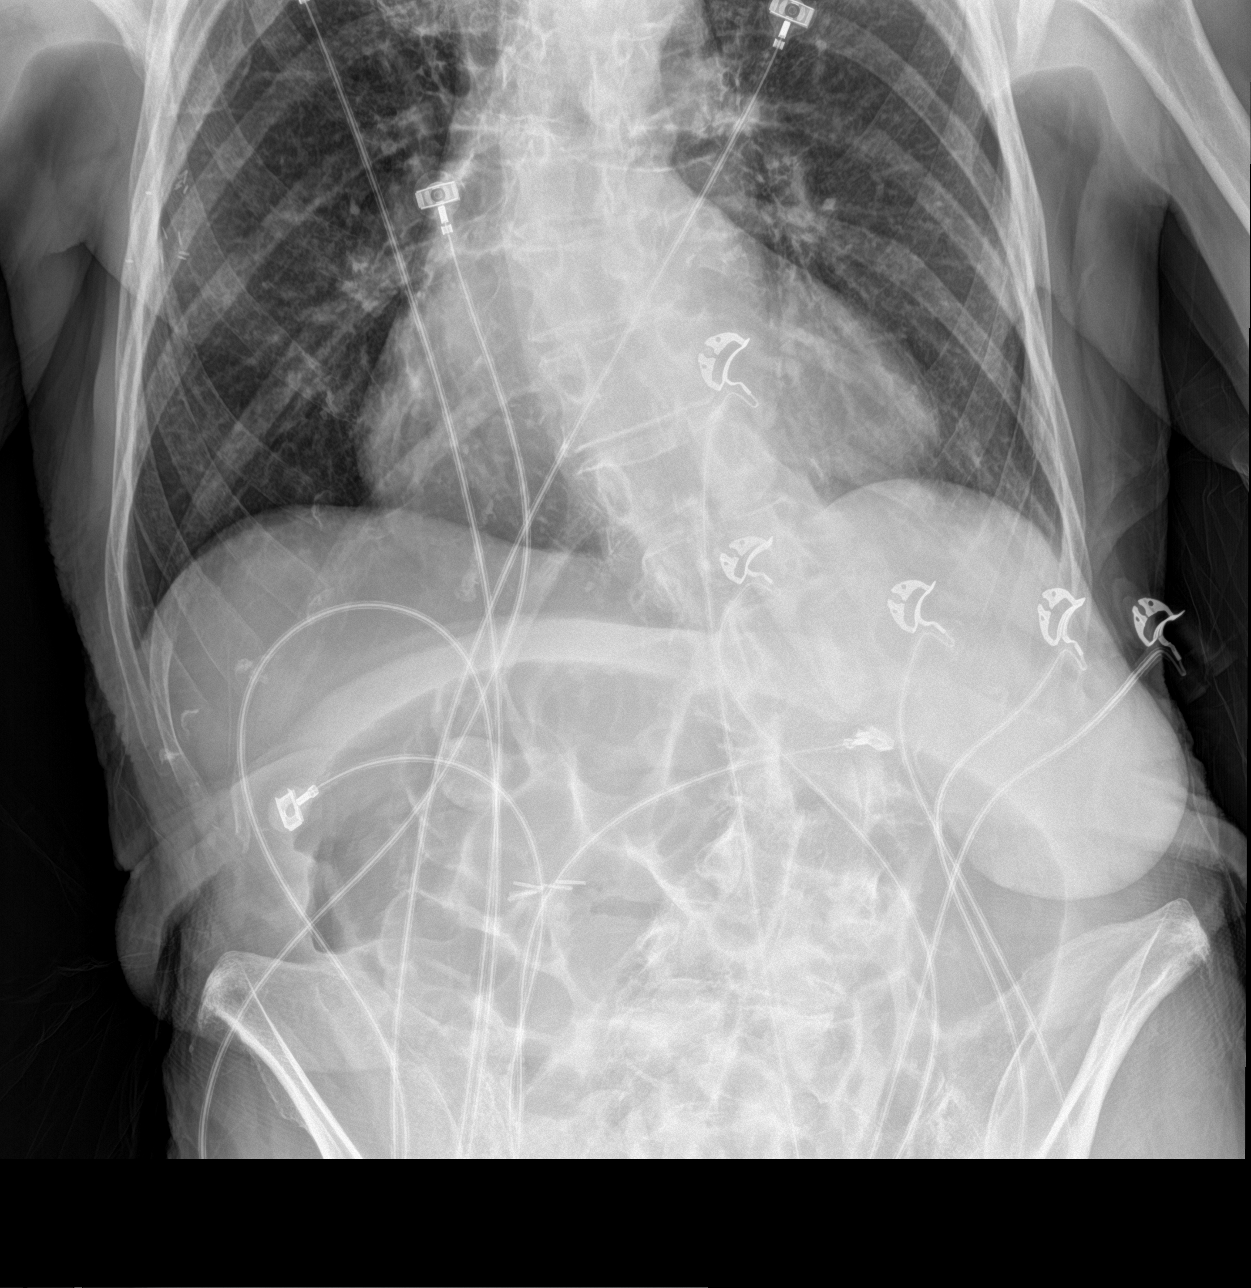

[abdomen supine]
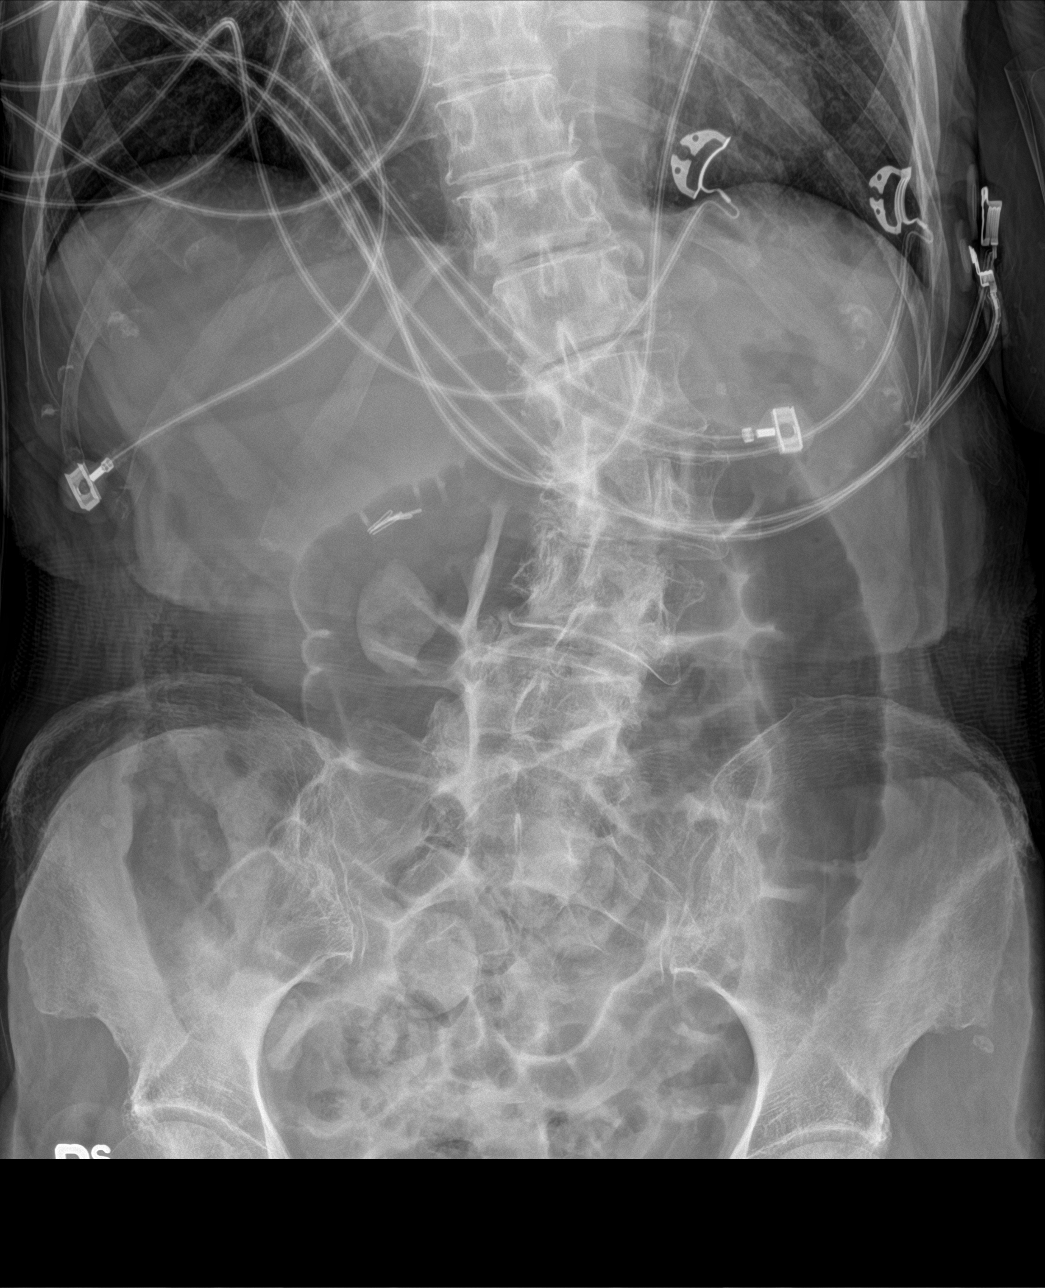

[3 of 3 positions shown; findings below may reference images not displayed]

FINDINGS: Cardiac enlargement without heart failure. Lungs are clear. Negative
for pneumonia or effusion. No mass or lung nodule. Right mastectomy.

Moderate lumbar levoscoliosis. Surgical clips in the gallbladder
fossa.

Negative for bowel obstruction. Possible mild ileus. No free air. No
renal calculi
IMPRESSION: No active cardiopulmonary abnormality

Mild increase in diffuse small bowel and large bowel gas most
consistent with mild ileus. No obstruction or free air.
